# Patient Record
Sex: Male | Born: 1945 | Race: White | Hispanic: No | Marital: Married | Smoking: Never smoker
Health system: Southern US, Community
[De-identification: ages and names within clinical notes are randomized; demographics above are authoritative.]

## PROBLEM LIST (undated history)

## (undated) DIAGNOSIS — R002 Palpitations: Secondary | ICD-10-CM

## (undated) DIAGNOSIS — K5790 Diverticulosis of intestine, part unspecified, without perforation or abscess without bleeding: Secondary | ICD-10-CM

## (undated) DIAGNOSIS — I10 Essential (primary) hypertension: Secondary | ICD-10-CM

## (undated) DIAGNOSIS — I471 Supraventricular tachycardia, unspecified: Secondary | ICD-10-CM

## (undated) DIAGNOSIS — C61 Malignant neoplasm of prostate: Secondary | ICD-10-CM

## (undated) DIAGNOSIS — E785 Hyperlipidemia, unspecified: Secondary | ICD-10-CM

## (undated) DIAGNOSIS — Z0000 Encounter for general adult medical examination without abnormal findings: Secondary | ICD-10-CM

## (undated) HISTORY — DX: Essential (primary) hypertension: I10

## (undated) HISTORY — DX: Malignant neoplasm of prostate: C61

## (undated) HISTORY — DX: Palpitations: R00.2

## (undated) HISTORY — DX: Supraventricular tachycardia, unspecified: I47.10

## (undated) HISTORY — DX: Supraventricular tachycardia: I47.1

## (undated) HISTORY — DX: Hyperlipidemia, unspecified: E78.5

## (undated) HISTORY — DX: Encounter for general adult medical examination without abnormal findings: Z00.00

## (undated) HISTORY — PX: BACK SURGERY: SHX140

## (undated) HISTORY — DX: Diverticulosis of intestine, part unspecified, without perforation or abscess without bleeding: K57.90

---

## 2001-01-06 ENCOUNTER — Emergency Department (HOSPITAL_COMMUNITY): Admission: EM | Admit: 2001-01-06 | Discharge: 2001-01-07 | Payer: Self-pay | Admitting: Emergency Medicine

## 2001-01-07 ENCOUNTER — Encounter: Payer: Self-pay | Admitting: Emergency Medicine

## 2002-11-21 ENCOUNTER — Encounter: Payer: Self-pay | Admitting: Internal Medicine

## 2004-06-05 ENCOUNTER — Ambulatory Visit: Payer: Self-pay | Admitting: Cardiology

## 2004-06-18 ENCOUNTER — Ambulatory Visit: Payer: Self-pay

## 2004-06-27 ENCOUNTER — Ambulatory Visit: Payer: Self-pay | Admitting: Cardiology

## 2004-07-08 ENCOUNTER — Ambulatory Visit: Payer: Self-pay | Admitting: Cardiology

## 2004-08-14 ENCOUNTER — Ambulatory Visit: Payer: Self-pay | Admitting: Cardiology

## 2004-10-20 ENCOUNTER — Ambulatory Visit: Payer: Self-pay | Admitting: Cardiology

## 2005-02-19 ENCOUNTER — Ambulatory Visit: Payer: Self-pay | Admitting: Internal Medicine

## 2005-11-18 ENCOUNTER — Ambulatory Visit: Payer: Self-pay | Admitting: Cardiology

## 2005-11-26 ENCOUNTER — Ambulatory Visit: Payer: Self-pay | Admitting: Cardiology

## 2005-12-18 ENCOUNTER — Ambulatory Visit: Payer: Self-pay | Admitting: Cardiology

## 2006-01-20 ENCOUNTER — Ambulatory Visit: Payer: Self-pay | Admitting: Cardiology

## 2006-05-20 ENCOUNTER — Ambulatory Visit: Payer: Self-pay

## 2006-06-08 ENCOUNTER — Ambulatory Visit: Payer: Self-pay

## 2006-08-23 ENCOUNTER — Ambulatory Visit: Payer: Self-pay | Admitting: Cardiology

## 2007-02-17 ENCOUNTER — Ambulatory Visit: Payer: Self-pay | Admitting: Internal Medicine

## 2007-02-17 LAB — CONVERTED CEMR LAB
ALT: 22 units/L (ref 0–53)
AST: 22 units/L (ref 0–37)
Albumin: 3.6 g/dL (ref 3.5–5.2)
Alkaline Phosphatase: 62 units/L (ref 39–117)
BUN: 12 mg/dL (ref 6–23)
Basophils Absolute: 0 10*3/uL (ref 0.0–0.1)
Basophils Relative: 0.3 % (ref 0.0–1.0)
Bilirubin, Direct: 0.1 mg/dL (ref 0.0–0.3)
CO2: 31 meq/L (ref 19–32)
CRP, High Sensitivity: 0.6
Calcium: 9.1 mg/dL (ref 8.4–10.5)
Chloride: 105 meq/L (ref 96–112)
Cholesterol: 165 mg/dL (ref 0–200)
Creatinine, Ser: 1.1 mg/dL (ref 0.4–1.5)
Eosinophils Absolute: 0.2 10*3/uL (ref 0.0–0.6)
Eosinophils Relative: 3.7 % (ref 0.0–5.0)
GFR calc Af Amer: 88 mL/min
GFR calc non Af Amer: 73 mL/min
Glucose, Bld: 95 mg/dL (ref 70–99)
HCT: 39.5 % (ref 39.0–52.0)
HDL: 46 mg/dL (ref >39.0)
Hemoglobin: 14 g/dL (ref 13.0–17.0)
LDL Cholesterol: 97 mg/dL (ref 0–99)
Lymphocytes Relative: 39.3 % (ref 12.0–46.0)
MCHC: 35.3 g/dL (ref 30.0–36.0)
MCV: 93.8 fL (ref 78.0–100.0)
Monocytes Absolute: 0.5 10*3/uL (ref 0.2–0.7)
Monocytes Relative: 8.7 % (ref 3.0–11.0)
Neutro Abs: 2.5 10*3/uL (ref 1.4–7.7)
Neutrophils Relative %: 48 % (ref 43.0–77.0)
PSA: 1.07 ng/mL (ref 0.10–4.00)
Platelets: 322 10*3/uL (ref 150–400)
Potassium: 4.4 meq/L (ref 3.5–5.1)
RBC: 4.21 M/uL — ABNORMAL LOW (ref 4.22–5.81)
RDW: 12.6 % (ref 11.5–14.6)
Sodium: 142 meq/L (ref 135–145)
TSH: 0.63 microintl units/mL (ref 0.35–5.50)
Total Bilirubin: 0.7 mg/dL (ref 0.3–1.2)
Total CHOL/HDL Ratio: 3.6
Total Protein: 6.3 g/dL (ref 6.0–8.3)
Triglycerides: 108 mg/dL (ref 0–149)
VLDL: 22 mg/dL (ref 0–40)
WBC: 5.2 10*3/uL (ref 4.5–10.5)

## 2007-02-21 ENCOUNTER — Ambulatory Visit: Payer: Self-pay | Admitting: Internal Medicine

## 2007-02-22 ENCOUNTER — Ambulatory Visit: Payer: Self-pay | Admitting: Cardiology

## 2007-06-03 ENCOUNTER — Ambulatory Visit (HOSPITAL_COMMUNITY): Admission: RE | Admit: 2007-06-03 | Discharge: 2007-06-05 | Payer: Self-pay | Admitting: Orthopedic Surgery

## 2007-06-13 ENCOUNTER — Telehealth (INDEPENDENT_AMBULATORY_CARE_PROVIDER_SITE_OTHER): Payer: Self-pay | Admitting: *Deleted

## 2007-06-14 DIAGNOSIS — E785 Hyperlipidemia, unspecified: Secondary | ICD-10-CM | POA: Insufficient documentation

## 2007-06-14 DIAGNOSIS — K5730 Diverticulosis of large intestine without perforation or abscess without bleeding: Secondary | ICD-10-CM | POA: Insufficient documentation

## 2007-06-14 DIAGNOSIS — I10 Essential (primary) hypertension: Secondary | ICD-10-CM

## 2007-06-14 DIAGNOSIS — R002 Palpitations: Secondary | ICD-10-CM

## 2007-06-30 ENCOUNTER — Telehealth (INDEPENDENT_AMBULATORY_CARE_PROVIDER_SITE_OTHER): Payer: Self-pay | Admitting: *Deleted

## 2008-03-12 ENCOUNTER — Ambulatory Visit: Payer: Self-pay | Admitting: Internal Medicine

## 2008-03-12 DIAGNOSIS — R635 Abnormal weight gain: Secondary | ICD-10-CM | POA: Insufficient documentation

## 2008-03-12 DIAGNOSIS — N400 Enlarged prostate without lower urinary tract symptoms: Secondary | ICD-10-CM

## 2008-07-17 ENCOUNTER — Ambulatory Visit: Payer: Self-pay | Admitting: Internal Medicine

## 2008-07-17 DIAGNOSIS — J069 Acute upper respiratory infection, unspecified: Secondary | ICD-10-CM | POA: Insufficient documentation

## 2008-11-01 ENCOUNTER — Ambulatory Visit: Payer: Self-pay | Admitting: Cardiology

## 2008-11-01 ENCOUNTER — Ambulatory Visit: Payer: Self-pay | Admitting: Internal Medicine

## 2008-11-01 ENCOUNTER — Telehealth (INDEPENDENT_AMBULATORY_CARE_PROVIDER_SITE_OTHER): Payer: Self-pay | Admitting: *Deleted

## 2008-11-01 LAB — CONVERTED CEMR LAB
Calcium: 9.1 mg/dL (ref 8.4–10.5)
GFR calc non Af Amer: 80.57 mL/min (ref >60)
Sodium: 142 meq/L (ref 135–145)

## 2008-11-02 ENCOUNTER — Telehealth (INDEPENDENT_AMBULATORY_CARE_PROVIDER_SITE_OTHER): Payer: Self-pay | Admitting: *Deleted

## 2008-11-02 ENCOUNTER — Ambulatory Visit: Payer: Self-pay | Admitting: Internal Medicine

## 2008-11-02 DIAGNOSIS — T887XXA Unspecified adverse effect of drug or medicament, initial encounter: Secondary | ICD-10-CM | POA: Insufficient documentation

## 2008-11-14 ENCOUNTER — Encounter: Payer: Self-pay | Admitting: Cardiology

## 2008-11-14 ENCOUNTER — Ambulatory Visit: Payer: Self-pay | Admitting: Cardiology

## 2008-12-03 ENCOUNTER — Telehealth (INDEPENDENT_AMBULATORY_CARE_PROVIDER_SITE_OTHER): Payer: Self-pay | Admitting: *Deleted

## 2009-04-12 ENCOUNTER — Telehealth: Payer: Self-pay | Admitting: Cardiology

## 2009-04-15 ENCOUNTER — Telehealth: Payer: Self-pay | Admitting: Cardiology

## 2009-09-30 ENCOUNTER — Telehealth (INDEPENDENT_AMBULATORY_CARE_PROVIDER_SITE_OTHER): Payer: Self-pay | Admitting: *Deleted

## 2009-11-27 ENCOUNTER — Telehealth (INDEPENDENT_AMBULATORY_CARE_PROVIDER_SITE_OTHER): Payer: Self-pay | Admitting: *Deleted

## 2009-12-10 ENCOUNTER — Ambulatory Visit: Payer: Self-pay | Admitting: Internal Medicine

## 2010-01-23 ENCOUNTER — Ambulatory Visit: Payer: Self-pay | Admitting: Internal Medicine

## 2010-01-24 LAB — CONVERTED CEMR LAB
Albumin: 3.8 g/dL (ref 3.5–5.2)
Alkaline Phosphatase: 68 units/L (ref 39–117)
BUN: 17 mg/dL (ref 6–23)
Basophils Absolute: 0 10*3/uL (ref 0.0–0.1)
CO2: 31 meq/L (ref 19–32)
Calcium: 8.9 mg/dL (ref 8.4–10.5)
Creatinine, Ser: 1 mg/dL (ref 0.4–1.5)
Eosinophils Absolute: 0.2 10*3/uL (ref 0.0–0.7)
Glucose, Bld: 88 mg/dL (ref 70–99)
HDL: 51.5 mg/dL (ref >39.00)
Leukocytes, UA: NEGATIVE
Lymphocytes Relative: 28.8 % (ref 12.0–46.0)
MCHC: 34.2 g/dL (ref 30.0–36.0)
Neutro Abs: 3.4 10*3/uL (ref 1.4–7.7)
Neutrophils Relative %: 59.4 % (ref 43.0–77.0)
Nitrite: NEGATIVE
RDW: 13.5 % (ref 11.5–14.6)
Specific Gravity, Urine: 1.02 (ref 1.000–1.030)
Triglycerides: 118 mg/dL (ref 0.0–149.0)
Urine Glucose: NEGATIVE mg/dL
Urobilinogen, UA: 0.2 (ref 0.0–1.0)

## 2010-02-19 ENCOUNTER — Telehealth (INDEPENDENT_AMBULATORY_CARE_PROVIDER_SITE_OTHER): Payer: Self-pay | Admitting: *Deleted

## 2010-08-24 LAB — CONVERTED CEMR LAB
Albumin: 4.1 g/dL (ref 3.5–5.2)
Alkaline Phosphatase: 65 units/L (ref 39–117)
BUN: 16 mg/dL (ref 6–23)
Basophils Absolute: 0 10*3/uL (ref 0.0–0.1)
Bilirubin Urine: NEGATIVE
Cholesterol: 170 mg/dL (ref 0–200)
Creatinine, Ser: 1 mg/dL (ref 0.4–1.5)
Eosinophils Absolute: 0.2 10*3/uL (ref 0.0–0.7)
Eosinophils Relative: 3.2 % (ref 0.0–5.0)
GFR calc Af Amer: 98 mL/min
GFR calc non Af Amer: 81 mL/min
HCT: 41.9 % (ref 39.0–52.0)
HDL: 43.1 mg/dL (ref >39.0)
Hemoglobin, Urine: NEGATIVE
LDL Cholesterol: 107 mg/dL — ABNORMAL HIGH (ref 0–99)
MCHC: 34.5 g/dL (ref 30.0–36.0)
MCV: 96.9 fL (ref 78.0–100.0)
Monocytes Absolute: 0.5 10*3/uL (ref 0.1–1.0)
PSA: 1.05 ng/mL (ref 0.10–4.00)
Platelets: 294 10*3/uL (ref 150–400)
Potassium: 4.8 meq/L (ref 3.5–5.1)
TSH: 0.47 microintl units/mL (ref 0.35–5.50)
Total Bilirubin: 0.7 mg/dL (ref 0.3–1.2)
Total Protein, Urine: NEGATIVE mg/dL
Triglycerides: 98 mg/dL (ref 0–149)
Urine Glucose: NEGATIVE mg/dL
Urobilinogen, UA: 0.2 (ref 0.0–1.0)
VLDL: 20 mg/dL (ref 0–40)
WBC: 5.8 10*3/uL (ref 4.5–10.5)

## 2010-08-25 ENCOUNTER — Telehealth (INDEPENDENT_AMBULATORY_CARE_PROVIDER_SITE_OTHER): Payer: Self-pay | Admitting: *Deleted

## 2010-08-26 NOTE — Assessment & Plan Note (Signed)
Summary: Primary svc/ hbp   Primary Provider/Referring Provider:  [name]  CC:  Yearly followup.  Pt denies any complaints.  .  History of Present Illness: 61 yowm with  history of  palpitations and hypertension and nonsustained ventricular tachycardia  responsive to beta blockers.  July 17, 2008--Complains of 3 days of nasal congestion, cough, congestion, drainage. sore throat. Responded to zpark, mucinex dm and zyrtec.   November 02, 2008 ov cough came back 4 days prior to visit after doubled lisinopril assoc with intense sore throat withouth sign nasal symptoms, sob, excess mucus or purulent sputum. No itching / sneezing. Pt denies any  fever, chills, sweats, unintended wt loss, pleuritic or exertional cp, orthopnea pnd or leg swelling.  rec d/c ace > complete resolution  Dec 10, 2009 Yearly followup.  Pt denies any complaints.  Pt denies any significant sore throat, dysphagia, itching, sneezing,  nasal congestion or excess secretions,  fever, chills, sweats, unintended wt loss, pleuritic or exertional cp, hempoptysis, change in activity tolerance  orthopnea pnd or leg swelling   Current Medications (verified): 1)  Metoprolol Tartrate 25 Mg Tabs (Metoprolol Tartrate) .Marland Kitchen.. 1 Once Daily 2)  Bayer Low Strength 81 Mg  Tbec (Aspirin) .... Take 1 Tablet By Mouth Once A Day 3)  Viagra 50 Mg Tabs (Sildenafil Citrate) .... Use One Hour Before Activity As Needed 4)  Hyzaar 100-25 Mg Tabs (Losartan Potassium-Hctz) .... Take 1 Tablet By Mouth Once A Day  Allergies (verified): No Known Drug Allergies  Past History:  Past Medical History: HBP    - ACE d/c'd 11/02/08 cough/sorethroat > resolved Palpitations   - neg myoview 11/05 with EF 58% Divertiuculosis   - see colonoscopy 5/06 rec fu 5/16 Health Maintenance...........................................Marland KitchenWert  Vital Signs:  Patient profile:   66 year old male Weight:      185 pounds BMI:     25.18 O2 Sat:      98 % on Room air Temp:     97.6  degrees F oral Pulse rate:   76 / minute BP sitting:   130 / 90  (left arm)  Vitals Entered By: Vernie Murders (Dec 10, 2009 10:10 AM)  O2 Flow:  Room air  Physical Exam  Additional Exam:  wt 198  > 201 11/01/08 > 185 Dec 10, 2009  anxious wm in nad  HEENT: nl dentition, turbinates, and orophanx. throat completely unremarkable, nontender sinus.  Neck without JVD/Nodes/TM Lungs clear to A and P bilaterally without cough on insp or exp maneuvers RRR no s3 or murmur or increase in P2 Abd soft and benign with nl excursion in the supine position. No bruits or organomegaly fem pulses intact Ext warm without calf tenderness, cyanosis clubbing or edema, nl pulses     Impression & Recommendations:  Problem # 1:  HYPERTENSION (ICD-401.9)  not ideal. needs to use the metaprolol two times a day to help smooth out the peaks and troughs  His updated medication list for this problem includes:    Metoprolol Tartrate 25 Mg Tabs (Metoprolol tartrate) .Marland Kitchen... 1 twice daily    Hyzaar 100-25 Mg Tabs (Losartan potassium-hctz) .Marland Kitchen... Take 1 tablet by mouth once a day  Orders: Est. Patient Level III ([address]109)  Problem # 2:  HYPERLIPIDEMIA, MILD (ICD-272.4)  Labs Reviewed: SGOT: 24 (03/12/2008)   SGPT: 26 (03/12/2008)   HDL:43.1 (03/12/2008), 46.0 (02/17/2007)  LDL:107 (03/12/2008), 97 (60/45/4098)  Chol:170 (03/12/2008), 165 (02/17/2007)  Trig:98 (03/12/2008), 108 (02/17/2007)   needs cpx  Orders: Est.  Patient Level III (85027)  Medications Added to Medication List This Visit: 1)  Metoprolol Tartrate 25 Mg Tabs (Metoprolol tartrate) .Marland Kitchen.. 1 once daily 2)  Metoprolol Tartrate 25 Mg Tabs (Metoprolol tartrate) .Marland Kitchen.. 1 twice daily  Patient Instructions: 1)  Change metaprolol to 25 mg one twice daily 2)  Continue hyzaar unless blood pressure drops too low in which case you can cut in half or call for half case. 3)  Please schedule a follow-up appointment in 6 weeks with CPX Prescriptions: METOPROLOL  TARTRATE 25 MG TABS (METOPROLOL TARTRATE) 1 twice daily  #60 x 11   Entered and Authorized by:   Nyoka Cowden MD   Signed by:   Nyoka Cowden MD on 12/10/2009   Method used:   Electronically to        [facility]  [phone]* (retail)       [address]       [place], [place]  [address]       Ph: [phone] or [phone]       Fax: [phone]   RxID:   [id number]

## 2010-08-26 NOTE — Progress Notes (Signed)
Summary: bp meds needed asap- pt out  Phone Note Call from Patient Call back at Work Phone [phone]   Caller: Patient Call For: wert Summary of Call: pt requests to have rx LOSARTAN HCTZ called in to [facility] on [address]. he is out and needs this today asap. wants his full rx (will not do mail order this time per pt). pt # [id number] Initial call taken by: Tivis Ringer, CNA,  September 30, 2009 9:37 AM  Follow-up for Phone Call        Pt informed that refill was sent to pharmacy. Abigail Miyamoto RN  September 30, 2009 9:51 AM     Prescriptions: HYZAAR 100-25 MG TABS (LOSARTAN POTASSIUM-HCTZ) Take 1 tablet by mouth once a day  #30 x 2   Entered by:   Abigail Miyamoto RN   Authorized by:   Nyoka Cowden MD   Signed by:   Abigail Miyamoto RN on 09/30/2009   Method used:   Electronically to        [facility]  [phone]* (retail)       [address]       [place], [place]  [address]       Ph: [phone] or [phone]       Fax: [phone]   RxID:   [id number]

## 2010-08-26 NOTE — Progress Notes (Signed)
Summary: Refill Viagra   Phone Note Outgoing Call   Summary of Call: let him know I refilled his prescription as requested Initial call taken by: Nyoka Cowden MD,  Nov 27, 2009 3:50 PM  Follow-up for Phone Call        ATC pt, home # not working.  ATC work #, NA. Boone Master CNA  Nov 27, 2009 3:56 PM  pt called, advised of ready rx at pharmacy.  also informed pt that he is overdue for appt - appt made with MW 12-10-09 @ 1030.  pt aware he must keep this appt to continue to receive med refills.  pt verbalized his understanding.  Follow-up by: Boone Master CNA,  Nov 27, 2009 4:01 PM    New/Updated Medications: VIAGRA 50 MG TABS (SILDENAFIL CITRATE) Use one hour before activity as needed Prescriptions: VIAGRA 50 MG TABS (SILDENAFIL CITRATE) Use one hour before activity as needed  #10 x 11   Entered and Authorized by:   Nyoka Cowden MD   Signed by:   Nyoka Cowden MD on 11/27/2009   Method used:   Electronically to        [facility]  [phone]* (retail)       [address]       [place], [place]  [address]       Ph: [phone] or [phone]       Fax: [phone]   RxID:   [id number]

## 2010-08-26 NOTE — Progress Notes (Signed)
Summary: ok to take Tylenol #3 with bp meds  Phone Note Call from Patient Call back at Home Phone [phone]   Caller: Patient Call For: wert Reason for Call: Talk to Nurse Summary of Call: Pt states he has widsom tooth pulled, was prescribed tylenol 3 for pain, wants to know if it's ok to take with his bp meds, pls advise. Initial call taken by: Darletta Moll,  February 19, 2010 3:04 PM  Follow-up for Phone Call        Pls advise thanks Vernie Murders  February 19, 2010 3:10 PM it's fine Follow-up by: Nyoka Cowden MD,  February 19, 2010 3:25 PM  Additional Follow-up for Phone Call Additional follow up Details #1::        called spoke with patient.  advised of MW's response as stated above.  pt verbalized his understanding. Boone Master CNA/MA  February 19, 2010 3:28 PM

## 2010-08-26 NOTE — Assessment & Plan Note (Signed)
Summary: Primary svc/ cpx    Primary Provider/Referring Provider:  [name]  CC:  CPX-Pt is not fasting.Marland Kitchen  History of Present Illness: 65 yowm  never smoker with  history of  palpitations and hypertension and nonsustained ventricular tachycardia  responsive to beta blockers.  November 02, 2008 ov cough  after doubled lisinopril assoc with intense sore throat withouth sign nasal symptoms, sob, excess mucus or purulent sputum. No itching / sneezing.  rec d/c ace, resolved  January 23, 2010  cpx no complaints, works out 3 x weekly.  Pt denies any significant sore throat, dysphagia, itching, sneezing,  nasal congestion or excess secretions,  fever, chills, sweats, unintended wt loss, pleuritic or exertional cp, hempoptysis, change in activity tolerance  orthopnea pnd or leg swelling.       Current Medications (verified): 1)  Metoprolol Tartrate 25 Mg Tabs (Metoprolol Tartrate) .Marland Kitchen.. 1 Twice Daily 2)  Bayer Low Strength 81 Mg  Tbec (Aspirin) .... Take 1 Tablet By Mouth Once A Day 3)  Viagra 50 Mg Tabs (Sildenafil Citrate) .... Use One Hour Before Activity As Needed 4)  Hyzaar 100-25 Mg Tabs (Losartan Potassium-Hctz) .... 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 throat completely unremarkable, nontender sinus.  Neck without JVD/Nodes/TM Lungs clear to A and P bilaterally without cough on insp or exp maneuvers RRR no s3 or murmur or increase in P2 Abd soft and benign with nl excursion in the supine position. 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, stool g neg Neuro alert, approp, no deficits  Cholesterol               175 mg/dL                   0-454     ATP III Classification            Desirable:  < 200 mg/dL                    Borderline High:  200 - 239 mg/dL               High:  > =  240 mg/dL   Triglycerides             118.0 mg/dL                 0.4-540.9     Normal:  <150 mg/dL     Borderline High:  811 - 199 mg/dL   HDL                       91.47 mg/dL                 >82.95   VLDL Cholesterol          23.6 mg/dL                  6.2-13.0   LDL Cholesterol      [H]  865 mg/dL                   7-84  CHO/HDL Ratio:  CHD Risk                             3                    Men          Women     1/2 Average Risk     3.4          3.3     Average Risk          5.0          4.4     2X Average Risk          9.6          7.1     3X Average Risk          15.0          11.0                           Tests: (2) BMP (METABOL)   Sodium                    141 mEq/L                   135-145   Potassium                 4.5 mEq/L                   3.5-5.1   Chloride                  107 mEq/L                   96-112   Carbon Dioxide            31 mEq/L                    19-32   Glucose                   88 mg/dL                    69-62   BUN                       17 mg/dL  6-23   Creatinine                1.0 mg/dL                   7.8-4.6   Calcium                   8.9 mg/dL                   9.6-29.5   GFR                       84.12 mL/min                >60  Tests: (3) CBC Platelet w/Diff (CBCD)   White Cell Count          5.8 K/uL                    4.5-10.5   Red Cell Count            4.33 Mil/uL                 4.22-5.81   Hemoglobin                14.2 g/dL                   28.4-13.2   Hematocrit                41.5 %                      39.0-52.0   MCV                       95.9 fl                     78.0-100.0   MCHC                      34.2 g/dL                   44.0-10.2   RDW                       13.5 %                      11.5-14.6   Platelet Count            274.0 K/uL                  150.0-400.0   Neutrophil %              59.4 %                      43.0-77.0   Lymphocyte %              28.8 %                      12.0-46.0   Monocyte %                8.5 %                       3.0-12.0   Eosinophils%  3.1 %                       0.0-5.0   Basophils %               0.2 %                       0.0-3.0   Neutrophill Absolute      3.4 K/uL                    1.4-7.7   Lymphocyte Absolute       1.7 K/uL                    0.7-4.0   Monocyte Absolute         0.5 K/uL                    0.1-1.0  Eosinophils, Absolute                             0.2 K/uL                    0.0-0.7   Basophils Absolute        0.0 K/uL                    0.0-0.1  Tests: (4) Hepatic/Liver Function Panel (HEPATIC)   Total Bilirubin           0.7 mg/dL                   6.2-3.7   Direct Bilirubin          0.1 mg/dL                   6.2-8.3   Alkaline Phosphatase      68 U/L                      39-117   AST                       19 U/L                      0-37   ALT                       18 U/L                      0-53   Total Protein             6.5 g/dL                    1.5-1.7   Albumin                   3.8 g/dL                     6.1-6.0  Tests: (5) TSH (TSH)   FastTSH                   0.82 uIU/mL                 0.35-5.50  Tests: (6) UDip Only (UDIP)   Color  LT. YELLOW       RANGE:  Yellow;Lt. Yellow   Clarity                   CLEAR                       Clear   Specific Gravity          1.020                       1.000 - 1.030   Urine Ph                  6.0                         5.0-8.0   Protein                   NEGATIVE                    Negative   Urine Glucose             NEGATIVE                    Negative   Ketones                   NEGATIVE                    Negative   Urine Bilirubin           NEGATIVE                    Negative   Blood                     NEGATIVE                    Negative   Urobilinogen              0.2                         0.0 - 1.0   Leukocyte Esterace        NEGATIVE                    Negative   Nitrite                   NEGATIVE                    Negative   CXR  Procedure date:  01/23/2010  Findings:      Comparison: 03/12/2008   Findings: Trachea is midline.  Heart size normal.  Lungs are clear. No pleural fluid. Pectus deformity.   IMPRESSION: No acute findings.  Impression & Recommendations:  Problem # 1:  HYPERTENSION (ICD-401.9)  His updated medication list for this problem includes:    Metoprolol Tartrate 25 Mg Tabs (Metoprolol tartrate) .Marland Kitchen... 1 twice daily    Hyzaar 100-25 Mg Tabs (Losartan potassium-hctz) .Marland Kitchen... Take 1 tablet by mouth once a day  Orders: EKG w/ Interpretation (93000) T-2 View CXR (71020TC)  Problem # 2:  PALPITATIONS (ICD-785.1)  His updated medication list for this problem includes:    Metoprolol Tartrate 25 Mg Tabs (Metoprolol tartrate) .Marland Kitchen... 1 twice daily  Problem # 3:  HYPERLIPIDEMIA, MILD (ICD-272.4)  Labs Reviewed: SGOT: 24 (03/12/2008)   SGPT: 26 (03/12/2008)   HDL:43.1 (03/12/2008), 46.0 (02/17/2007)  LDL:107 (03/12/2008), 97 (78/29/5621) =    January 23, 2010 =100   Chol:170  (03/12/2008), 165 (02/17/2007)  Trig:98 (03/12/2008), 108 (02/17/2007)  Other Orders: TLB-Lipid Panel (80061-LIPID) TLB-BMP (Basic Metabolic Panel-BMET) (80048-METABOL) TLB-CBC Platelet - w/Differential (85025-CBCD) TLB-Hepatic/Liver Function Pnl (80076-HEPATIC) TLB-TSH (Thyroid Stimulating Hormone) (84443-TSH) TLB-Udip ONLY (81003-UDIP) Tdap => 11yrs IM (30865) Admin 1st Vaccine (78469) Est. Patient 40-64 years (62952)  Patient Instructions: 1)  See Patient Care Coordinator before leaving for to sign release for path report on skin bx 2)  Call [phone] for your results w/in next 3 days - if there's something important  I feel you need to know,  I'll be in touch with you directly.  3)  Please schedule a follow-up appointment in 6 months, sooner if needed    [facility] ECG  ID: [id number] Patient: Cory Vasquez, Cory Vasquez DOB: Aug 27, 1945 Age: 65 Years Old Sex: Male Race: White Height: 72 Weight: 186 Status: Unconfirmed Past Medical History:  HBP    - ACE d/c'd 11/02/08 cough/sorethroat > resolved Palpitations   - neg myoview 11/05 with EF 58% Divertiuculosis   - see colonoscopy 5/06 rec fu 5/16 Health Maintenance...........................................Marland KitchenWert Recorded: 01/23/2010 09:06 AM P/PR: 112 ms / 170 ms - Heart rate (maximum exercise) QRS: 83 QT/QTc/QTd: 408 ms / 403 ms / 163 ms - Heart rate (maximum exercise)  P/QRS/T axis: 67 deg / 50 deg / 59 deg - Heart rate (maximum exercise)  Heartrate: 57 bpm  Interpretation:   sinus rhythm (slow)   Normal ECG    Orders Added: 1)  EKG w/ Interpretation [93000] 2)  TLB-Lipid Panel [80061-LIPID] 3)  TLB-BMP (Basic Metabolic Panel-BMET) [80048-METABOL] 4)  TLB-CBC Platelet - w/Differential [85025-CBCD] 5)  TLB-Hepatic/Liver Function Pnl [80076-HEPATIC] 6)  TLB-TSH (Thyroid Stimulating Hormone) [84443-TSH] 7)  TLB-Udip ONLY [81003-UDIP] 8)  Tdap => 8yrs IM [90715] 9)  Admin 1st Vaccine [90471] 10)  T-2 View CXR  [71020TC] 11)  Est. Patient 40-64 years [64403]    Immunizations Administered:  Tetanus Vaccine:    Vaccine Type: Tdap    Site: right deltoid    Mfr: GlaxoSmithKline    Dose: 0.5 ml    Route: IM    Given by: Carver Fila    Exp. Date: 10/19/2011    Lot #: [id number]

## 2010-09-03 NOTE — Progress Notes (Signed)
Summary: rx-AWAIT A CALL BACK FROM PT  Phone Note Call from Patient Call back at Home Phone [phone]   Caller: Patient Call For: wert Summary of Call: requests rx for viagra- [facility] (1 month supply only per pt).  Initial call taken by: Tivis Ringer, CNA,  August 25, 2010 1:11 PM  Follow-up for Phone Call        Spoke with pt and he states that he is going to check with [facility] again before we send in rx.  He states that he wants to be sure this should not be sent to retail pharm.  I advised that typically we only send 90 day supply of meds to [facility].  Will await a call back. Vernie Murders  August 25, 2010 3:10 PM   Additional Follow-up for Phone Call Additional follow up Details #1::        Spoke with pt.  He states that refill is no longer needed.   Additional Follow-up by: Vernie Murders,  August 26, 2010 5:10 PM

## 2010-09-29 ENCOUNTER — Encounter: Payer: Self-pay | Admitting: Adult Health

## 2010-09-29 ENCOUNTER — Ambulatory Visit (INDEPENDENT_AMBULATORY_CARE_PROVIDER_SITE_OTHER): Payer: Self-pay | Admitting: Adult Health

## 2010-09-29 DIAGNOSIS — J069 Acute upper respiratory infection, unspecified: Secondary | ICD-10-CM

## 2010-10-07 NOTE — Assessment & Plan Note (Signed)
Summary: Acute NP office visit - sinus inf   Primary Provider/Referring Provider:  [name]  CC:  prod cough clear mucus, sore throat, sinus pressure/congestion, PND w/ clear nasal drainage, chills x2days - denies wheezing, and increased SOB.  History of Present Illness: 65 yowm  never smoker with  history of  palpitations and hypertension and nonsustained ventricular tachycardia  responsive to beta blockers.  November 02, 2008 ov cough  after doubled lisinopril assoc with intense sore throat withouth sign nasal symptoms, sob, excess mucus or purulent sputum. No itching / sneezing.  rec d/c ace, resolved  January 23, 2010  cpx no complaints, works out 3 x weekly.  Pt denies any significant sore throat, dysphagia, itching, sneezing,  nasal congestion or excess secretions,  fever, chills, sweats, unintended wt loss, pleuritic or exertional cp, hempoptysis, change in activity tolerance  orthopnea pnd or leg swelling.     September 29, 2010 --Presents for an acute office visit. Complains of prod cough clear mucus, sore throat, sinus pressure/congestion, PND w/ clear nasal drainage, chills x4 days. Used some otc cold meds without much help. Cough and drainage worse today. Denies chest pain, dyspnea, orthopnea, hemoptysis, fever, n/v/d, edema, headache,recent travel or antibiotics.   Medications Prior to Update: 1)  Metoprolol Tartrate 25 Mg Tabs (Metoprolol Tartrate) .Marland Kitchen.. 1 Twice Daily 2)  Bayer Low Strength 81 Mg  Tbec (Aspirin) .... Take 1 Tablet By Mouth Once A Day 3)  Hyzaar 100-25 Mg Tabs (Losartan Potassium-Hctz) .... Take 1 Tablet By Mouth Once A Day 4)  Viagra 50 Mg Tabs (Sildenafil Citrate) .... Use One Hour Before Activity As Needed  Current Medications (verified): 1)  Metoprolol Tartrate 25 Mg Tabs (Metoprolol Tartrate) .... Take 1 Tablet By Mouth Every Morning 2)  Bayer Low Strength 81 Mg  Tbec (Aspirin) .... Take 1 Tablet By Mouth Once A Day 3)  Hyzaar 100-25 Mg Tabs (Losartan Potassium-Hctz) ....  Take 1 Tablet By Mouth Once A Day 4)  Viagra 50 Mg Tabs (Sildenafil Citrate) .... Use One Hour Before Activity As Needed  Allergies (verified): No Known Drug Allergies  Past History:  Past Medical History: Last updated: 01/23/2010 HBP    - ACE d/c'd 11/02/08 cough/sorethroat > resolved Palpitations   - neg myoview 11/05 with EF 58% Divertiuculosis   - see colonoscopy 5/06 rec fu 11/2014 Health Maintenance...........................................Marland KitchenWert     - Td January 23, 2010      - CPX January 23, 2010   Past Surgical History: Last updated: 03/12/2008 Back surgery [name] 11/08   Family History: Last updated: 01/23/2010 Ht dz father in 70s, mother 38 DM and HBP brother no cancer  Social History: Last updated: 09/29/2010 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  Risk Factors: Smoking Status: never (06/14/2007)  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 No Comments Medications reviewed with patient Daytime contact number verified with patient. Boone Master CNA/MA  September 29, 2010 3:10 PM    Physical Exam  Additional Exam:  wt 198  > 201 11/01/08 > 185 Dec 10, 2009> 186  January 23, 2010 > 185 January 23, 2010 >191 September 29, 2010   anxious wm in nad  HEENT: nl dentition, turbinates, and orophanx. throat completely unremarkable, nasal mucosa irritated, red  swollen.   Neck without JVD/Nodes/TM Lungs clear to A and P bilaterally without cough on insp or exp maneuvers RRR no s3 or murmur or increase in P2 Abd soft and benign with nl excursion in the supine position. No bruits or organomegaly fem pulses intact Ext warm without calf tenderness, cyanosis clubbing or edema, nl pulses  Neuro alert, approp, no deficits   Impression & Recommendations:  Problem # 1:  UPPER RESPIRATORY INFECTION (ICD-465.9)  Acute URI ? viral in nature  Plan : Mucinex DM two times a day as needed cough/congestion  Saline nasal rinses as needed  Zyrtec 10mg  at bedtime as needed drainge Fluids and rest Tylenol as needed  Please contact office for sooner follow up if symptoms do not improve or worsen  Zpack to have on hold if symptoms do not imrprove with discolored mucus.  His updated medication list for this problem includes:    Bayer Low Strength 81 Mg Tbec (Aspirin) .Marland Kitchen... Take 1 tablet by mouth once a day  Orders: Est. Patient Level III (04540)  Medications Added to Medication List This Visit: 1)  Metoprolol Tartrate 25 Mg Tabs (Metoprolol tartrate) .... Take 1 tablet by mouth every morning 2)  Zithromax Z-pak 250 Mg Tabs (Azithromycin) .... Take  as directed  Patient Instructions: 1)  Mucinex DM two times a day as needed cough/congestion  2)  Saline nasal rinses as needed  3)  Zyrtec 10mg  at bedtime as needed drainge 4)  Fluids and rest 5)  Tylenol as needed  6)  Please contact office for sooner follow up if symptoms do not improve or worsen  7)  Zpack to have on hold if symptoms do not imrprove with discolored mucus.  Prescriptions: ZITHROMAX Z-PAK 250 MG TABS (AZITHROMYCIN) take  as directed  #1 x 0   Entered and Authorized by:   Rubye Oaks NP   Signed by:   Tammy Parrett NP on 09/29/2010   Method used:   Print then Give to Patient   RxID:   [id number]

## 2010-12-09 NOTE — Op Note (Signed)
NAMENORVIN, OHLIN              ACCOUNT NO.:  [id number]   MEDICAL RECORD NO.:  [id number]          PATIENT TYPE:  AMB   LOCATION:  DAY                          FACILITY:  [facility]   PHYSICIAN:  Georges Lynch. [name], M.D.DATE OF BIRTH:  1946-03-31   DATE OF PROCEDURE:  06/03/2007  DATE OF DISCHARGE:                               OPERATIVE REPORT   SURGEON:  Georges Lynch. Darrelyn Hillock, M.D.   ASSISTANT:  Marlowe Kays, M.D.   PREOPERATIVE DIAGNOSIS:  1. 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  2. Large foraminal disc at L4-L5 on the left.  Note, he had weakness      of his hip flexors on the left preop.  He has been in chronic pain      for several years.   POSTOPERATIVE DIAGNOSIS:  1. 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  2. Large foraminal disc at L4-L5 on the left.  Note, he had weakness      of his hip flexors on the left preop.  He has been in chronic pain      for several years.   OPERATION:  1. Decompression of the lateral recess and foraminotomy L4-L5, left.  2. Microdiscectomy of a foraminal disc at L4-L5 on the left.   PROCEDURE:  Under general anesthesia, routine orthopedic prepping and  draping of the lower back carried out.  Two needles were placed on the  back for localization purposes.  At this time, an incision was made over  the L4-L5 interspace.  Bleeders were identified and cauterized.  A  second x-ray was taken to verify our position once we were down to the  lamina.  We separated the muscle from the lamina bilaterally.  Self-  retaining retractors were inserted.  We then went down and carried out a  hemilaminectomy in the usual fashion.  We went out far laterally to  decompress the recess and do a partial facetectomy because of the nature  of the foraminal disc.  We needed to go out far laterally in order to be  able to reach the disc.  Following that, we gently retracted the dura.  We identified the nerve roots in the area and  cauterized lateral recess  veins with the bipolar.  A cruciate incision was made in the posterior  longitudinal ligament.  I then went down and carried out a discectomy.  I utilized the Epstein curettes and a hockey stick to go out far  laterally and decompressed the disc down into the space.  Once we  completed the discectomy, we then were easily able to take a nerve hook  up under the subligamentous space to tease out a few more pieces of  disc.  We kept persisting in regards to searching for the disc  bilaterally, proximally and distally, and medially, as well.  We felt we  had a good decompression.  We did foraminotomies at both roots.  We were  able to easily pass a hockey stick out the foramina after the  microdiscectomy was carried out.  We also were able to follow the  L5  root down, as well.  We had a very adequate, very thorough  decompression, thoroughly irrigated out the area, loosely applied some  thrombin soaked Gelfoam.  We then closed the wound in layers in the  usual fashion except I  left a small portion of the deep portion of the wound open proximally  for drainage purposes.  The subcu was closed with 0 Vicryl, the skin was  closed with metal staples.  A sterile Neosporin dressing was applied.  The patient left the operating room in satisfactory condition.           ______________________________  Georges Lynch Darrelyn Hillock, M.D.     RAG/MEDQ  D:  06/03/2007  T:  06/04/2007  Job:  [id number]

## 2010-12-09 NOTE — Assessment & Plan Note (Signed)
[facility]                            CARDIOLOGY OFFICE NOTE   CARSON, BOGDEN                     MRN:          [id number]  DATE:02/22/2007                            DOB:          Oct 27, 1945    REASON FOR VISIT:  Mr. Blizard is a very pleasant gentleman who I have  seen in the past for palpitations and hypertension.  Since I last saw  him he is doing extremely well.  He is exercising routinely and lost a  significant amount of weight.  He is watching his diet very closely as  well.  He denies any chest pain, dyspnea and there has been no  palpitations or syncope since we saw him before.   CURRENT MEDICATIONS:  His medications at present include:  1. Metoprolol ER 50 mg p.o. daily.  2. Hydrochlorothiazide 12.5 mg p.o. daily.  3. Aspirin 81 mg p.o. daily.  4. Lisinopril 20 mg p.o. daily.  5. Vitamin.   PHYSICAL EXAMINATION:  VITAL SIGNS:  Exam today shows a blood pressure  of 107/71 and his pulse is 58.  He weighs 182 pounds.  NECK:  Supple with no bruits.  CHEST:  Clear.  CARDIOVASCULAR:  Exam reveals a regular rate and rhythm.  ABDOMEN:  Exam is benign.  EXTREMITIES:  Show no edema.   CLINICAL DATA:  His electrocardiogram shows a sinus bradycardia with  rate of 54.  There are no ST changes noted.   DIAGNOSES:  1. Hypertension.  His blood pressure is well controlled on his present      medications.  2. History of palpitations and nonsustained ventricular tachycardia on      exercise treadmill with normal left ventricular function - he has      had no further palpitations - we will continue with his present      dose of Toprol.   PLAN:  Mr. Diguglielmo will continue with risk factor modification.  He will  see Dr. [name] for  his primary care issues.  I will see him back on an as-  needed basis.     Madolyn Frieze Jens Som, MD, [facility]  Electronically Signed    BSC/MedQ  DD: 02/22/2007  DT: 02/23/2007  Job #: [id number]

## 2010-12-09 NOTE — Assessment & Plan Note (Signed)
[facility]                             PULMONARY OFFICE NOTE   Cory Vasquez, Cory Vasquez                     MRN:          [id number]  DATE:02/21/2007                            DOB:          09-18-1945    HISTORY:  This is a 65 year old white male with palpitations and  hypertension who had nonsustained ventricular tachycardia at the time of  his cardiology evaluation, has subsequently been followed by Dr.  Jens Som with excellent control now of all of his symptoms since beta-  blocker was started.  On his most recent visits he has been having  trouble with orthostasis but now has it about right in terms of taking  his blood pressure medicines and having no symptoms of either  palpitation with exercise (which includes aerobics up to 5 times weekly)  or with daily activity nor any orthostatic complaints.  His last  Cardiolite was in 2005, was felt to be normal with a normal ejection  fraction.   The patient denies any exertional chest pain, orthopnea, PND or leg  swelling, TIA or claudication symptoms.   PAST MEDICAL HISTORY:  1. Hypertension.  2. Palpitations with nonsustained ventricular tachycardia as noted.  3. Difficulty with weight control and mild hyperlipidemia, with target      LDL less than 30 based on hypertension and male gender.  4. Diverticulosis by colonoscopy May of 2006.   ALLERGIES:  None known.   MEDICATIONS:  See Medication List column dated February 21, 2007.  Reviewed  with patient, correct as listed.   SOCIAL HISTORY:  1. He has never smoked.  2. Works in [employer]   FAMILY HISTORY:  1. Positive for heart disease in his father in mid 25's.  Died of CHF.  2. Older brother has diabetes and hypertension.  3. No family history of any colon or prostate cancer to his knowledge.   REVIEW OF SYSTEMS:  Taken in detail on the worksheet, negative except as  outlined above.   PHYSICAL EXAMINATION:  This is a robust, pleasant,  healthy, ambulatory  white male in no acute distress weighing 184, which is 2 pounds less  than target and down about 10 pounds over the last year intentionally.  Blood pressure, however, is 140/80 without taking his morning medicines.  HEENT:  Limited funduscopy reveals no significant retinal arterial  change.  Oropharynx is clear.  Dentition is intact.  He does have a wax  impaction in the left ear, otherwise normal.  The right ear exam was  normal.  Carotid upstrokes were brisk without any bruits.  There was no  tracheal deviation or thyromegaly.  LUNG FIELDS:  There were no carotid bruits with adequate upstrokes  bilaterally.  Lung fields perfectly clear bilaterally to auscultation  and percussion.  There was very mild pectus excavatum deformity.  HEART:  Regular rate and rhythm, without murmur, gallop or rub or  displacement of PMI.  ABDOMEN:  Soft, benign with no palpable organomegaly, masses or  tenderness.  The femoral pulses are present bilaterally, no bruits.  GENITOURINARY:  Testes descended bilaterally, no nodules.  RECTUM:  Revealed mild BPH, stool guaiac was negative, there were no  prostate nodules.  EXTREMITIES:  Warm without any calf tenderness, cyanosis, clubbing or  edema.  Both dorsalis pedis pulses were reduced slightly in a symmetric  fashion.  Posterior tibials were strong.  SKIN EXAM:  Warm and dry with no lesions.  NEUROLOGIC:  No focal deficits or pathologic reflexes.  MUSCULOSKELETAL EXAM:  Also unremarkable including hips, knees and  shoulders.   Chest x-ray was normal.   LAB DATA:  Included an LDL cholesterol down now to 97 with an HDL of 46.  A chemistry profile was unremarkable.  A CBC was unremarkable.  A TSH  was normal.   IMPRESSION:  1. Hypertension is adequately controlled despite the fact that he      neglected to take his blood pressure medicines today.  I have      advised him to do so, especially before he sees Dr. Jens Som to see      if  any further adjustments up or down need to be done.  2. His palpitations have resolved with the use of metoprolol.  No      evidence of underlying significant cardiac systolic or diastolic      function abnormalities nor any coronary disease to date clinically      or by Adenosine Myoview study in 2005; I will defer to Dr. Jens Som      whether additional screening studies need to be done.  3. Left ear wax retention noted.  I have offered the service a nurse      practitioner to remove this and reviewed with him the risks of      chronic wax impaction.  4. History of mild weight excess complicated by hyperlipidemia, now      both completely reversed with weight down at target less than 186.  5. Diverticulosis by most recent colonoscopy in May, 2006, in the      computer for recall.   FOLLOWUP:  The patient monitors his own blood pressure and is no longer  having symptoms.  I will defer to Dr. Jens Som whether cardiac followup  is even needed in this particular setting and will plan on seeing the  patient back here at least yearly for comprehensive care evaluation,  sooner if needed.     Cory Vasquez. Cory Sires, MD, [facility]  Electronically Signed   MBW/MedQ  DD: 02/21/2007  DT: 02/21/2007  Job #: [id number]   cc:   Madolyn Frieze. Jens Som, MD, [facility]

## 2010-12-09 NOTE — Assessment & Plan Note (Signed)
[facility]                            CARDIOLOGY OFFICE NOTE   Cory Vasquez, Cory Vasquez                       MRN:          [id number]  DATE:05/30/2007                            DOB:          June 13, 1946    This patient is a gentleman I have seen in the past for palpitations.  He also has a history of brief nonsustained ventricular tachycardia on  an exercise treadmill.  Of note, he had a Myoview on June 18, 2004.  There was no ischemia or infarction, and his ejection fraction was 58%.  An echocardiogram in November 2005 showed normal left ventricular  function and trace mitral and tricuspid regurgitation.  His palpitations  had improved on the Toprol.  He recently had an episode for several  minutes.  There was no associated pre-syncope, syncope, shortness of  breath, or chest pain.  It resolved spontaneously.  He is scheduled for  disk surgery in his lower back later this week and asks about proceeding  with surgery based on his palpitations.  Given that he has had no chest  pain or shortness of breath and a Myoview 3 years ago that was normal, I  have told him that he can proceed with his surgery.  Note, his  palpitations are not new and they have occurred previously in the past.  If they worsen in the future, we could consider an event monitor.     Madolyn Frieze Cory Som, MD, [facility]  Electronically Signed    BSC/MedQ  DD: 05/30/2007  DT: 05/31/2007  Job #: [id number]

## 2010-12-12 NOTE — Assessment & Plan Note (Signed)
 [facility]                              CARDIOLOGY OFFICE NOTE   JAMAURY, GUMZ                     MRN:          [id number]  DATE:06/08/2006                            DOB:          [birth year]    Mr. Limas is a gentleman I have seen in the past for hypertension as well  as palpitations.  Since I last saw him he called the office and his blood  pressure remained elevated.  He is now up to 40 of lisinopril and we added  Norvasc 5 mg p.o. daily.  He has only been on the Norvasc for approximately  5 days and his blood pressure is improving.  It is in the 130-135 range/80-  85 range.  He denies any chest pain, shortness of breath or headaches.   MEDICATIONS:  1. Toprol 50 mg p.o. daily.  2. Lisinopril 40 mg p.o. daily.  3. Norvasc 5 mg p.o. daily.  4. Hydrochlorothiazide 12.5 mg p.o. daily.  5. Aspirin 81 mg p.o. daily.   PHYSICAL EXAM TODAY:  Shows a blood pressure of 138/86 and his pulse is 61.  He weighs 209 pounds.  NECK:  Supple with no bruits.  CHEST:  Clear.  CARDIOVASCULAR:  Regular rate and rhythm.  EXTREMITIES:  Show no edema.   His electrocardiogram today shows a sinus rhythm at a rate of 61.  There are  no ST changes noted.   DIAGNOSES:  1. Hypertension.  2. History of palpitations and nonsustained ventricular tachycardia on      exercise treadmill.  3. Normal left ventricular function.   PLAN:  Mr. Eastham blood pressure is better on the Norvasc.  I have asked  him to continue to monitor it at home and if it increases then we could  increase his Norvasc to 10 mg p.o. daily.  He will otherwise continue on his  present medications.  I think the likelihood of secondary hypertension is  low, but we will check renal Dopplers to exclude renal artery stenosis.  I  will also check a TSH.  We also reviewed lifestyle modification including  exercise, diet, avoiding salt and alcohol.  He will see [place] back in 8-12   weeks.     Madolyn Frieze Jens Som, MD, [facility]  Electronically Signed    BSC/MedQ  DD: 06/08/2006  DT: 06/08/2006  Job #: [id number]

## 2010-12-12 NOTE — Assessment & Plan Note (Signed)
[facility]                            CARDIOLOGY OFFICE NOTE   Cory Vasquez, Cory Vasquez                     MRN:          [id number]  DATE:08/23/2006                            DOB:          11/08/1945    HISTORY OF PRESENT ILLNESS:  Cory Vasquez is a gentleman who has a  history of palpitations and hypertension.  When I last saw Cory Vasquez in  November, Cory Vasquez blood pressure had increased, and Cory Vasquez had recently had  Norvasc added to Cory Vasquez medical regimen.  At that time, we implemented  significant risk factor modification.  We also scheduled Cory Vasquez to have  renal Dopplers which Cory Vasquez did not show for.  TSH was normal.  Cory Vasquez has  implemented an exercise program and has lost 10 pounds since then with  strict diet measures.  Cory Vasquez now states that Cory Vasquez blood pressure is running  low and Cory Vasquez has had decrease Cory Vasquez Lisinopril from 40-20.  Cory Vasquez systolic  pressure still runs around 110 or less at times.  Cory Vasquez also states that at  times Cory Vasquez feels dizzy with standing.  There is no dyspnea or chest pain.   MEDICATIONS:  1. Metoprolol 50 mg p.o. daily.  2. Norvasc 5 mg p.o. daily.  3. Hydrochlorothiazide 12.5 mg p.o. daily.  4. Aspirin 81 mg p.o. daily.  5. Lisinopril 20 mg p.o. daily.   PHYSICAL EXAMINATION:  VITAL SIGNS:  Blood pressure 110/76, pulse 62.  NECK:  Supple with no bruits.  CHEST:  Clear.  CARDIOVASCULAR:  Regular rate and rhythm.  EXTREMITIES:  No edema.   DIAGNOSES:  1. Hypertension.  2. History of palpitations and nonsustained ventricular tachycardia on      exercise treadmill with preserved LV function.   PLAN:  Cory Vasquez is much better after implementing Cory Vasquez exercise  program and implementing dietary changes.  Cory Vasquez states that Cory Vasquez feels  lightheaded at times with standing and Cory Vasquez systolic blood pressure is at  110.  I have asked Cory Vasquez to discontinue Cory Vasquez Norvasc and to continue to  track Cory Vasquez blood pressure at home.  We will adjust as indicated.  I will  see Cory Vasquez back in  six months.     Madolyn Frieze Cory Som, MD, [facility]  Electronically Signed    BSC/MedQ  DD: 08/23/2006  DT: 08/23/2006  Job #: [id number]

## 2011-01-14 ENCOUNTER — Telehealth: Payer: Self-pay | Admitting: Cardiology

## 2011-01-14 NOTE — Telephone Encounter (Signed)
Agree with metoprolol twice daily and f/u as scheduled Olga Millers

## 2011-01-14 NOTE — Telephone Encounter (Signed)
Left message for pt of dr Ludwig Clarks recommendations Deliah Goody

## 2011-01-14 NOTE — Telephone Encounter (Signed)
Spoke with pt, his meds include losartan/HCTZ 100-25mg  once daily and metoprolol tat 25mg  once daily. Explained to pt that the metoprolol needs to be taken twice daily. He will restart the metoprolol at 25mg  twice daily. The pt has a follow up appt on 02-10-11. He will restart taking the metoprolol twice daily. Will forward to dr Jens Som for his review Deliah Goody

## 2011-01-14 NOTE — Telephone Encounter (Signed)
Per pt calling B/p reading. No chest pain , sob. Pt concern about B/p. Pt asking should appt be move up  6/19 @ noon 168/100 6/19 @ pm  150/95 6/20 @ am 149/105

## 2011-02-04 ENCOUNTER — Other Ambulatory Visit: Payer: Self-pay | Admitting: Internal Medicine

## 2011-02-05 ENCOUNTER — Encounter: Payer: Self-pay | Admitting: Cardiology

## 2011-02-10 ENCOUNTER — Encounter: Payer: BC Managed Care – PPO | Admitting: Cardiology

## 2011-02-25 ENCOUNTER — Encounter: Payer: Self-pay | Admitting: Internal Medicine

## 2011-03-02 ENCOUNTER — Encounter: Payer: Self-pay | Admitting: Internal Medicine

## 2011-03-02 ENCOUNTER — Ambulatory Visit (INDEPENDENT_AMBULATORY_CARE_PROVIDER_SITE_OTHER): Payer: BC Managed Care – PPO | Admitting: Internal Medicine

## 2011-03-02 VITALS — BP 110/88 | HR 65 | Temp 97.9°F | Ht 73.0 in | Wt 192.6 lb

## 2011-03-02 DIAGNOSIS — R002 Palpitations: Secondary | ICD-10-CM

## 2011-03-02 DIAGNOSIS — E785 Hyperlipidemia, unspecified: Secondary | ICD-10-CM

## 2011-03-02 DIAGNOSIS — I10 Essential (primary) hypertension: Secondary | ICD-10-CM

## 2011-03-02 NOTE — Patient Instructions (Addendum)
Cerumenex is a wax removal kit that should be available for as needed use over the counter  Return fasting for your lab work this week  Be sure your multiple vitamin has at least 400 units of D  If you feel light headed standing try reducing your losartan to one half daily  We can see you yearly for follow up at this point

## 2011-03-02 NOTE — Progress Notes (Signed)
Subjective:     Patient ID: Cory Vasquez, male   DOB: 1946-01-23, 65 y.o.   MRN: [id number]  HPI    81 yowm never smoker with history of palpitations and hypertension and nonsustained ventricular tachycardia responsive to beta blockers.   November 02, 2008 ov cough after doubled lisinopril assoc with intense sore throat withouth sign nasal symptoms, sob, excess mucus or purulent sputum. No itching / sneezing. rec d/c ace, resolved  03/02/11 cpx/ Natasa Stigall  Cc occ light headedness if stands up too quickly.  No sob.  Pt denies any significant sore throat, dysphagia, itching, sneezing,  nasal congestion or excess/ purulent secretions,  fever, chills, sweats, unintended wt loss, pleuritic or exertional cp, hempoptysis, orthopnea pnd or leg swelling.    Also denies any obvious fluctuation of symptoms with weather or environmental changes or other aggravating or alleviating factors.         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  HENT: Negative for hearing loss, ear pain, nosebleeds, congestion, sore throat, facial swelling, rhinorrhea, sneezing, mouth sores, trouble swallowing, neck pain, neck stiffness, dental problem, voice change, postnasal drip, sinus pressure, tinnitus and ear discharge.   Eyes: Negative for photophobia, discharge, itching and visual  disturbance.  Respiratory: Negative for apnea, cough, choking, chest tightness, shortness of breath, wheezing and stridor.   Cardiovascular: Negative for chest pain, palpitations and leg swelling.  Gastrointestinal: Negative for nausea, vomiting, abdominal pain, constipation, blood in stool and abdominal distention.  Genitourinary: Negative for dysuria, urgency, frequency, hematuria, flank pain, decreased urine volume and difficulty urinating.  Musculoskeletal: Negative for myalgias, back pain, joint swelling, arthralgias and gait problem.  Skin: Positive for rash. Negative for color change and pallor.  Neurological: Positive for light-headedness. Negative for dizziness, tremors, seizures, syncope, speech difficulty, weakness, numbness and headaches.  Hematological: Negative for adenopathy. Does not bruise/bleed easily.  Psychiatric/Behavioral: Negative for confusion, sleep disturbance and agitation. The patient is not nervous/anxious.        Objective:   Physical Exam    wt   201 11/01/08 > 185 Dec 10, 2009> 186 January 23, 2010 > 185 January 23, 2010 >191 September 29, 2010 > 192 03/02/2011  anxious wm in nad  HEENT: nl dentition, turbinates, and orophanx. throat completely unremarkable,  Nl nasal mucosa Neck without JVD/Nodes/TM .  Wax impacting L ear Lungs clear to A and P bilaterally without cough on insp or exp maneuvers  RRR no s3 or murmur or increase in P2  Abd soft and benign with nl excursion in the supine position. No bruits or organomegaly fem pulses intact  Ext warm without calf tenderness, cyanosis clubbing or edema, nl pulses  Neuro alert, approp, no deficits Rectal: minimal bph stool G neg Testes down bilaterally, no ih, no nodules MS  Nl gait, no restrictions  cxr 03/02/11  No acute cardiopulmonary process is identified. Diaphragm is low- lying. I cannot exclude slight hyperinflation. 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 Osteophytes are  present in the spine.    Assessment:       Plan:

## 2011-03-03 ENCOUNTER — Ambulatory Visit (INDEPENDENT_AMBULATORY_CARE_PROVIDER_SITE_OTHER)
Admission: RE | Admit: 2011-03-03 | Discharge: 2011-03-03 | Disposition: A | Discharge status: Home / Self Care | Payer: BC Managed Care – PPO | Source: Ambulatory Visit | Attending: Internal Medicine | Admitting: Internal Medicine

## 2011-03-03 ENCOUNTER — Other Ambulatory Visit (INDEPENDENT_AMBULATORY_CARE_PROVIDER_SITE_OTHER): Payer: BC Managed Care – PPO

## 2011-03-03 ENCOUNTER — Encounter: Payer: Self-pay | Admitting: Internal Medicine

## 2011-03-03 DIAGNOSIS — I10 Essential (primary) hypertension: Secondary | ICD-10-CM

## 2011-03-03 LAB — URINALYSIS
Bilirubin Urine: NEGATIVE
Leukocytes, UA: NEGATIVE
Specific Gravity, Urine: 1.025 (ref 1.000–1.030)
Total Protein, Urine: NEGATIVE
Urine Glucose: NEGATIVE
pH: 6 (ref 5.0–8.0)

## 2011-03-03 LAB — HEPATIC FUNCTION PANEL
AST: 25 U/L (ref 0–37)
Alkaline Phosphatase: 65 U/L (ref 39–117)
Bilirubin, Direct: 0.1 mg/dL (ref 0.0–0.3)
Total Protein: 6.7 g/dL (ref 6.0–8.3)

## 2011-03-03 LAB — CBC WITH DIFFERENTIAL/PLATELET
Basophils Absolute: 0 10*3/uL (ref 0.0–0.1)
Basophils Relative: 0.3 % (ref 0.0–3.0)
Eosinophils Absolute: 0.2 10*3/uL (ref 0.0–0.7)
Lymphocytes Relative: 37.2 % (ref 12.0–46.0)
MCHC: 33.7 g/dL (ref 30.0–36.0)
Monocytes Relative: 9 % (ref 3.0–12.0)
Neutrophils Relative %: 50.2 % (ref 43.0–77.0)
RBC: 4.57 Mil/uL (ref 4.22–5.81)
WBC: 5.4 10*3/uL (ref 4.5–10.5)

## 2011-03-03 LAB — BASIC METABOLIC PANEL
CO2: 29 mEq/L (ref 19–32)
Calcium: 9.1 mg/dL (ref 8.4–10.5)
Creatinine, Ser: 0.9 mg/dL (ref 0.4–1.5)
GFR: 92.67 mL/min (ref >60.00)

## 2011-03-03 LAB — LIPID PANEL
Cholesterol: 176 mg/dL (ref 0–200)
HDL: 53.6 mg/dL (ref >39.00)
Triglycerides: 88 mg/dL (ref 0.0–149.0)
VLDL: 17.6 mg/dL (ref 0.0–40.0)

## 2011-03-03 LAB — TSH: TSH: 0.81 u[IU]/mL (ref 0.35–5.50)

## 2011-03-03 NOTE — Assessment & Plan Note (Signed)
Adequate control on present rx, reviewed  

## 2011-03-03 NOTE — Assessment & Plan Note (Signed)
Adequate control on present rx, reviewed - may be occ overcontrolled so ok to break in half the losartan if orthostatic

## 2011-03-04 NOTE — Progress Notes (Signed)
Quick Note:  Spoke with pt and notified of results per Dr. [name]. Pt verbalized understanding and denied any questions.  ______ 

## 2011-03-12 ENCOUNTER — Telehealth: Payer: Self-pay | Admitting: Internal Medicine

## 2011-03-12 ENCOUNTER — Other Ambulatory Visit: Payer: Self-pay | Admitting: Internal Medicine

## 2011-03-12 MED ORDER — METOPROLOL TARTRATE 25 MG PO TABS: 25.0000 mg | ORAL_TABLET | Freq: Two times a day (BID) | ORAL | Status: DC

## 2011-03-12 MED ORDER — LOSARTAN POTASSIUM-HCTZ 100-25 MG PO TABS: 1.0000 | ORAL_TABLET | Freq: Every day | ORAL | Status: DC

## 2011-03-12 NOTE — Telephone Encounter (Signed)
Pt needs refills on metoprolol and losartan. Refill sent. Pt aware. Carron Curie, CMA

## 2011-05-05 LAB — COMPREHENSIVE METABOLIC PANEL
Albumin: 4.1
Alkaline Phosphatase: 87
BUN: 23
Chloride: 104
Creatinine, Ser: 1.1
Glucose, Bld: 88
Potassium: 4
Total Bilirubin: 0.6
Total Protein: 7.2

## 2011-05-05 LAB — DIFFERENTIAL
Basophils Absolute: 0
Basophils Relative: 0
Lymphocytes Relative: 37
Monocytes Absolute: 0.7
Neutro Abs: 5
Neutrophils Relative %: 55

## 2011-05-05 LAB — URINALYSIS, ROUTINE W REFLEX MICROSCOPIC
Hgb urine dipstick: NEGATIVE
Nitrite: NEGATIVE
Specific Gravity, Urine: 1.03
Urobilinogen, UA: 0.2
pH: 5.5

## 2011-05-05 LAB — CBC
HCT: 46.1
Hemoglobin: 15.8
MCV: 93
Platelets: 402 — ABNORMAL HIGH
RDW: 12.5

## 2011-05-05 LAB — APTT: aPTT: 24

## 2011-05-05 LAB — TYPE AND SCREEN: ABO/RH(D): O POS

## 2011-05-05 LAB — PROTIME-INR: INR: 0.9

## 2011-06-05 ENCOUNTER — Encounter: Payer: Self-pay | Admitting: Adult Health

## 2011-06-05 ENCOUNTER — Ambulatory Visit (INDEPENDENT_AMBULATORY_CARE_PROVIDER_SITE_OTHER): Payer: BC Managed Care – PPO | Admitting: Adult Health

## 2011-06-05 DIAGNOSIS — I10 Essential (primary) hypertension: Secondary | ICD-10-CM

## 2011-06-08 ENCOUNTER — Encounter: Payer: Self-pay | Admitting: Adult Health

## 2011-06-08 ENCOUNTER — Ambulatory Visit (INDEPENDENT_AMBULATORY_CARE_PROVIDER_SITE_OTHER): Payer: BC Managed Care – PPO | Admitting: Adult Health

## 2011-06-08 VITALS — BP 106/68 | Temp 97.5°F | Ht 73.0 in | Wt 198.7 lb

## 2011-06-08 DIAGNOSIS — H6120 Impacted cerumen, unspecified ear: Secondary | ICD-10-CM

## 2011-06-08 NOTE — Patient Instructions (Signed)
May use Debrox for ear wax  As needed   follow up Dr. [name]  As planned and  As needed

## 2011-06-08 NOTE — Progress Notes (Signed)
Subjective:     Patient ID: Cory Vasquez, male   DOB: 01-May-1946, 65 y.o.   MRN: [id number]  HPI 52 yowm never smoker with history of palpitations and hypertension and nonsustained ventricular tachycardia responsive to beta blockers.   November 02, 2008 ov cough after doubled lisinopril assoc with intense sore throat withouth sign nasal symptoms, sob, excess mucus or purulent sputum. No itching / sneezing. rec d/c ace, resolved  03/02/11 cpx/ [name]  Cc occ light headedness if stands up too quickly.  No sob. >>no changes, labs done   06/08/2011 Acute OV  Complains of stopped up ears, esp on left. Decreased hearing and pain/off. No drainage or fever.  No sinus pain or pressure. NO otc used.         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  HEENT:   No headaches,  Difficulty swallowing,  Tooth/dental problems, or  Sore throat,                No sneezing, itching,  nasal congestion, post nasal drip,   CV:  No chest pain,  Orthopnea, PND, swelling in lower extremities, anasarca, dizziness, palpitations, syncope.   GI  No heartburn, indigestion, abdominal pain, nausea, vomiting, diarrhea, change in bowel habits, loss of appetite, bloody stools.   Resp: No shortness of breath with exertion or at rest.  No excess mucus, no productive cough,  No non-productive cough,  No coughing up of  blood.  No change in color of mucus.  No wheezing.  No chest wall deformity  Skin: no rash or lesions.  GU: no dysuria, change in color of urine, no urgency or frequency.  No flank pain, no hematuria   MS:  No joint pain or swelling.  No decreased range of motion.  No back pain.  Psych:  No change in mood or affect. No depression or anxiety.  No memory loss.          Objective:   Physical Exam    wt   201 11/01/08 > 185 Dec 10, 2009> 186 January 23, 2010 > 185 January 23, 2010 >191 September 29, 2010 > 192 03/02/2011 >06/08/2011  wm in nad  HEENT: nl dentition, turbinates, and orophanx. throat completely unremarkable,  Nl nasal mucosa Neck without JVD/Nodes/TM .  Wax impaction of L ear, R. Ear clear  Lungs clear to A and P bilaterally without cough on insp or exp maneuvers  RRR no s3 or murmur or increase in P2  Abd soft and benign with nl excursion in the supine position. No bruits or organomegaly fem pulses intact  Ext warm without calf tenderness, cyanosis clubbing or edema, nl pulses      Assessment:       Plan:

## 2011-06-08 NOTE — Progress Notes (Signed)
  Subjective:    Patient ID: Cory Vasquez, male    DOB: 1945-11-28, 65 y.o.   MRN: [id number]  HPI Pt not seen rescheduled    Review of Systems     Objective:   Physical Exam        Assessment & Plan:

## 2011-06-08 NOTE — Assessment & Plan Note (Signed)
Pt not seen- rescheduled.  

## 2011-06-08 NOTE — Assessment & Plan Note (Signed)
Left cerumen impaction with ear irrigation with sucessful removal.  Tolerated well.  Advised to use debrox As needed

## 2011-06-15 ENCOUNTER — Other Ambulatory Visit: Payer: Self-pay | Admitting: Internal Medicine

## 2012-03-03 ENCOUNTER — Ambulatory Visit (INDEPENDENT_AMBULATORY_CARE_PROVIDER_SITE_OTHER)
Admission: RE | Admit: 2012-03-03 | Discharge: 2012-03-03 | Disposition: A | Discharge status: Home / Self Care | Payer: Medicare Other | Source: Ambulatory Visit | Attending: Internal Medicine | Admitting: Internal Medicine

## 2012-03-03 ENCOUNTER — Encounter: Payer: Self-pay | Admitting: Internal Medicine

## 2012-03-03 ENCOUNTER — Ambulatory Visit (INDEPENDENT_AMBULATORY_CARE_PROVIDER_SITE_OTHER): Payer: Medicare Other | Admitting: Internal Medicine

## 2012-03-03 ENCOUNTER — Other Ambulatory Visit (INDEPENDENT_AMBULATORY_CARE_PROVIDER_SITE_OTHER): Payer: Medicare Other

## 2012-03-03 VITALS — BP 108/62 | HR 60 | Temp 97.4°F | Ht 72.5 in | Wt 193.8 lb

## 2012-03-03 DIAGNOSIS — K5732 Diverticulitis of large intestine without perforation or abscess without bleeding: Secondary | ICD-10-CM

## 2012-03-03 DIAGNOSIS — E785 Hyperlipidemia, unspecified: Secondary | ICD-10-CM

## 2012-03-03 DIAGNOSIS — R002 Palpitations: Secondary | ICD-10-CM

## 2012-03-03 DIAGNOSIS — I10 Essential (primary) hypertension: Secondary | ICD-10-CM

## 2012-03-03 DIAGNOSIS — H6120 Impacted cerumen, unspecified ear: Secondary | ICD-10-CM

## 2012-03-03 LAB — URINALYSIS
Bilirubin Urine: NEGATIVE
Leukocytes, UA: NEGATIVE
Nitrite: NEGATIVE
Specific Gravity, Urine: 1.015 (ref 1.000–1.030)
pH: 6.5 (ref 5.0–8.0)

## 2012-03-03 LAB — CBC WITH DIFFERENTIAL/PLATELET
Basophils Absolute: 0 10*3/uL (ref 0.0–0.1)
Eosinophils Relative: 2.9 % (ref 0.0–5.0)
Hemoglobin: 15.5 g/dL (ref 13.0–17.0)
Lymphocytes Relative: 37.4 % (ref 12.0–46.0)
Monocytes Relative: 8.8 % (ref 3.0–12.0)
Neutro Abs: 3.4 10*3/uL (ref 1.4–7.7)
Platelets: 284 10*3/uL (ref 150.0–400.0)
RDW: 12.7 % (ref 11.5–14.6)
WBC: 6.7 10*3/uL (ref 4.5–10.5)

## 2012-03-03 LAB — BASIC METABOLIC PANEL
BUN: 22 mg/dL (ref 6–23)
Chloride: 102 mEq/L (ref 96–112)
GFR: 86.68 mL/min (ref >60.00)
Potassium: 4.8 mEq/L (ref 3.5–5.1)
Sodium: 137 mEq/L (ref 135–145)

## 2012-03-03 LAB — LIPID PANEL
Cholesterol: 188 mg/dL (ref 0–200)
HDL: 55.3 mg/dL (ref >39.00)
LDL Cholesterol: 110 mg/dL — ABNORMAL HIGH (ref 0–99)
Triglycerides: 115 mg/dL (ref 0.0–149.0)
VLDL: 23 mg/dL (ref 0.0–40.0)

## 2012-03-03 LAB — TSH: TSH: 0.72 u[IU]/mL (ref 0.35–5.50)

## 2012-03-03 LAB — HEPATIC FUNCTION PANEL
ALT: 25 U/L (ref 0–53)
Albumin: 4 g/dL (ref 3.5–5.2)
Total Protein: 7.2 g/dL (ref 6.0–8.3)

## 2012-03-03 NOTE — Progress Notes (Signed)
Quick Note:  Spoke with pt and notified of results per Dr. [name]. Pt verbalized understanding and denied any questions.  ______ 

## 2012-03-03 NOTE — Assessment & Plan Note (Signed)
-   Target LDL < 130 as male, hbp, pos fm hx  Adequate control on present rx, reviewed no meds needed

## 2012-03-03 NOTE — Patient Instructions (Addendum)
Please remember to go to the lab and x-ray department downstairs for your tests - we will call you with the results when they are available.    Exercise 30 min 3x weekly at a level where you are continuously short of breath of breath but not out of breath  Return yearly for comprehensive evaluation, sooner if needed

## 2012-03-03 NOTE — Progress Notes (Signed)
Subjective:     Patient ID: Cory Vasquez, male   DOB: 11-18-1945 .   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   November 02, 2008 ov cough after doubled lisinopril assoc with intense sore throat withouth sign nasal symptoms, sob, excess mucus or purulent sputum. No itching / sneezing. rec d/c ace, resolved  03/02/11 cpx/ [name]  Cc occ light headedness if stands up too quickly.  No sob. >>no changes, labs done   06/08/2011 Acute OV  Complains of stopped up ears, esp on left. Decreased hearing and pain/off. No drainage or fever.  No sinus pain or pressure. NO otc used.  rec May use Debrox for ear wax  As needed    03/03/2012 f/u ov/[name] cc f/u hbp, hyperlipidemia.  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 throat completely unremarkable,  Nl nasal mucosa Neck without JVD/Nodes/TM .  Wax impaction of L ear, R. Ear clear  Lungs clear to A and P bilaterally without cough on insp or exp maneuvers  RRR no s3 or murmur or increase in P2  Abd soft and benign with nl excursion in the supine position. No bruits or organomegaly fem  pulses intact  Ext warm without calf tenderness, cyanosis clubbing or edema, nl pulses  GU nl testes, no IH Rectal min bph, smooth, stool G neg MS nl gait, station, no deform Neuro intact sensorium, no motor or cerebellar def   CXR  03/03/2012 : No active cardiopulmonary disease.      Assessment:       Plan:

## 2012-03-03 NOTE — Assessment & Plan Note (Signed)
Min on L, f/u prn if otc's not helping

## 2012-03-03 NOTE — Assessment & Plan Note (Signed)
-   neg myoview 05/2004 with EF 58%   No recurrence on low dose Beta blocker

## 2012-03-03 NOTE — Assessment & Plan Note (Signed)
Adequate control on present rx, reviewed  

## 2012-03-28 ENCOUNTER — Other Ambulatory Visit: Payer: Self-pay | Admitting: Internal Medicine

## 2012-04-30 ENCOUNTER — Other Ambulatory Visit: Payer: Self-pay | Admitting: Internal Medicine

## 2012-05-24 ENCOUNTER — Telehealth: Payer: Self-pay | Admitting: Cardiology

## 2012-05-24 NOTE — Telephone Encounter (Signed)
New Problem:    Patient called in wanting to know if it would be ok to take pain medication with his BP meds.  Please call back.

## 2012-05-24 NOTE — Telephone Encounter (Signed)
Pt. Advised that it is ok to take Advil for pain but would be better if he takes Tylenol, he verbalized understanding.

## 2012-05-24 NOTE — Telephone Encounter (Signed)
**Note De-identified Cory Vasquez Obfuscation** LMTCB

## 2012-09-26 ENCOUNTER — Ambulatory Visit (INDEPENDENT_AMBULATORY_CARE_PROVIDER_SITE_OTHER): Payer: Medicare Other | Admitting: Internal Medicine

## 2012-09-26 ENCOUNTER — Encounter: Payer: Self-pay | Admitting: Internal Medicine

## 2012-09-26 VITALS — BP 108/70 | HR 61 | Temp 97.9°F | Ht 72.0 in | Wt 196.0 lb

## 2012-09-26 DIAGNOSIS — J069 Acute upper respiratory infection, unspecified: Secondary | ICD-10-CM | POA: Insufficient documentation

## 2012-09-26 MED ORDER — AZITHROMYCIN 250 MG PO TABS: ORAL_TABLET | ORAL | Status: DC

## 2012-09-26 NOTE — Patient Instructions (Addendum)
If fever, worse symptoms over next 72 hours then take zpak  Zyrtec works for drippy nose  Mucinex or mucinex dm is good for cough and thick mucus  Stuffy nose > saline nasal spring  Follow up CPX due after 03/03/13

## 2012-09-26 NOTE — Assessment & Plan Note (Signed)
Explained natural history of uri and why it's necessary in patients at risk to avoid triggering cyclical cough initially  triggered by epithelial injury and a heightened sensitivty to the effects of any upper airway irritants,  most importantly acid - related.  That is, the more sensitive the epithelium damaged for virus, the more the cough, the more the secondary reflux (especially in those prone to reflux) the more the irritation of the sensitive mucosa and so on in a cyclical pattern.   Also reviewed how to manage symptoms and when / if to start zpak for what amounts now to a viral process.  Note ace cough hx suggests may be an irritable larynx / cyclical cough tendency

## 2012-09-26 NOTE — Assessment & Plan Note (Signed)
Adequate control on present rx, reviewed  

## 2012-09-26 NOTE — Progress Notes (Signed)
Subjective:     Patient ID: Cory Vasquez, male   DOB: 1945-10-23 .   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   November 02, 2008 ov cough after doubled lisinopril assoc with intense sore throat withouth sign nasal symptoms, sob, excess mucus or purulent sputum. No itching / sneezing. rec d/c ace, resolved  03/02/11 cpx/ [name]  Cc occ light headedness if stands up too quickly.  No sob. >>no changes, labs done   06/08/2011 Acute OV  Complains of stopped up ears, esp on left. Decreased hearing and pain/off. No drainage or fever.  No sinus pain or pressure. NO otc used.  rec May use Debrox for ear wax  As needed    03/03/2012 f/u ov/[name] cc f/u hbp, hyperlipidemia.  Ex = walking  rec Increase aerobics, no change rx   09/26/2012 f/u ov/[name] cc acute onset/ pnd st/ nasal congestion no purulent sputum, fever, sob  Sleeping ok without nocturnal  or early am exacerbation  of respiratory  c/o's or need for noct saba. Also denies any obvious fluctuation of symptoms with weather or environmental changes or other aggravating or alleviating factors except as outlined above   ROS  The following are not active complaints unless bolded sore throat, dysphagia, dental problems, itching, sneezing,  nasal congestion or excess/ purulent secretions, ear ache,   fever, chills, sweats, unintended wt loss, pleuritic or exertional cp, hemoptysis,  orthopnea pnd or leg swelling, presyncope, palpitations, heartburn, abdominal pain, anorexia, nausea, vomiting, diarrhea  or change in bowel or urinary habits, change in stools or urine, dysuria,hematuria,  rash, arthralgias, visual complaints, headache, numbness weakness or ataxia or problems with walking or coordination,  change in mood/affect or memory.              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  Wax impaction of L ear, R. Ear clear  Lungs clear to A and P bilaterally without cough on insp or exp maneuvers  RRR no s3 or murmur or increase in P2  Abd soft and benign with nl excursion in the supine position. No bruits or organomegaly fem pulses intact  Ext warm without calf tenderness, cyanosis clubbing or edema, nl pulses      CXR  03/03/2012 : No active cardiopulmonary disease      Assessment:       Plan:

## 2012-10-08 ENCOUNTER — Other Ambulatory Visit: Payer: Self-pay | Admitting: Internal Medicine

## 2012-10-12 ENCOUNTER — Telehealth: Payer: Self-pay | Admitting: Internal Medicine

## 2012-10-12 MED ORDER — LOSARTAN POTASSIUM-HCTZ 100-25 MG PO TABS: ORAL_TABLET | ORAL | Status: DC

## 2012-10-12 NOTE — Telephone Encounter (Signed)
Pt returned triage's call.  Holly D Pryor ° °

## 2012-10-12 NOTE — Telephone Encounter (Signed)
I spoke with pt. Aware rx has been sent. Nothing further was needed

## 2012-10-12 NOTE — Telephone Encounter (Signed)
[id number] x1 

## 2012-11-08 ENCOUNTER — Ambulatory Visit (INDEPENDENT_AMBULATORY_CARE_PROVIDER_SITE_OTHER): Payer: Medicare Other | Admitting: Internal Medicine

## 2012-11-08 ENCOUNTER — Encounter: Payer: Self-pay | Admitting: Internal Medicine

## 2012-11-08 VITALS — BP 120/78 | HR 55 | Temp 97.4°F | Ht 72.0 in | Wt 197.9 lb

## 2012-11-08 DIAGNOSIS — L0293 Carbuncle, unspecified: Secondary | ICD-10-CM

## 2012-11-08 MED ORDER — DOXYCYCLINE HYCLATE 100 MG PO TABS: 100.0000 mg | ORAL_TABLET | Freq: Two times a day (BID) | ORAL | Status: DC

## 2012-11-08 NOTE — Patient Instructions (Addendum)
Doxycline 100 twice daily and warm slt salty compresses 4 x daily   If not improving by the 4/21 you will need to call your dermatologist for appointment  Comprehensive evaluation is due after  August 8 th - we can do that here or refer you to one of our primary care doctors, whichever you prefer

## 2012-11-08 NOTE — Progress Notes (Signed)
Subjective:     Patient ID: Cory Vasquez, male   DOB: June 26, 1946 .   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   November 02, 2008 ov cough after doubled lisinopril assoc with intense sore throat withouth sign nasal symptoms, sob, excess mucus or purulent sputum. No itching / sneezing. rec d/c ace, resolved  03/02/11 cpx/ Christelle Igoe  Cc occ light headedness if stands up too quickly.  No sob. >>no changes, labs done   06/08/2011 Acute OV  Complains of stopped up ears, esp on left. Decreased hearing and pain/off. No drainage or fever.  No sinus pain or pressure. NO otc used.  rec May use Debrox for ear wax  As needed    03/03/2012 f/u ov/Jasneet Schobert cc f/u hbp, hyperlipidemia.  Ex = walking  rec Increase aerobics, no change rx   11/08/2012 f/u ov/Gilliam Hawkes new problem Chief Complaint  Patient presents with  . Follow-up    red bump under right arm that is tender to touch.   noted in shower one day prior to OV doesn't know how long present, no pain unless touches it,  no fever, no gardening or shoulder pain.  ROS  The following are not active complaints unless bolded sore throat, dysphagia, dental problems, itching, sneezing,  nasal congestion or excess/ purulent secretions, ear ache,   fever, chills, sweats, unintended wt loss, pleuritic or exertional cp, hemoptysis,  orthopnea pnd or leg swelling, presyncope, palpitations, heartburn, abdominal pain, anorexia, nausea, vomiting, diarrhea  or change in bowel or urinary habits, change in stools or urine, dysuria,hematuria,  rash, arthralgias, visual complaints, headache, numbness weakness or ataxia or problems with walking or coordination,  change in mood/affect or memory.                   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  Wax impaction of L ear, R. Ear clear  Lungs clear to A and P bilaterally without cough on insp or exp maneuvers  RRR no s3 or murmur or increase in P2  Abd soft and benign with nl excursion in the supine position. No bruits or organomegaly fem pulses intact  Ext warm without calf tenderness, cyanosis clubbing or edema, nl pulses  Skin  Dime sized carbuncle R axilla, no nodes or other lesions     CXR  03/03/2012 :  No active cardiopulmonary disease      Assessment:       Plan:

## 2012-11-10 DIAGNOSIS — L0293 Carbuncle, unspecified: Secondary | ICD-10-CM | POA: Insufficient documentation

## 2012-11-10 NOTE — Assessment & Plan Note (Signed)
Classic, likely related to folliculitis from axilllary hair  rec warm compresses and doxy, f/u derm prn

## 2013-01-23 ENCOUNTER — Encounter: Payer: Self-pay | Admitting: Internal Medicine

## 2013-01-23 ENCOUNTER — Ambulatory Visit (INDEPENDENT_AMBULATORY_CARE_PROVIDER_SITE_OTHER): Payer: Medicare Other | Admitting: Internal Medicine

## 2013-01-23 VITALS — BP 112/70 | HR 55 | Temp 97.8°F | Ht 72.0 in | Wt 196.8 lb

## 2013-01-23 DIAGNOSIS — L0292 Furuncle, unspecified: Secondary | ICD-10-CM

## 2013-01-23 DIAGNOSIS — L0293 Carbuncle, unspecified: Secondary | ICD-10-CM

## 2013-01-23 MED ORDER — DOXYCYCLINE HYCLATE 100 MG PO TABS: 100.0000 mg | ORAL_TABLET | Freq: Two times a day (BID) | ORAL | Status: DC

## 2013-01-23 NOTE — Progress Notes (Signed)
Subjective:     Patient ID: Cory Vasquez, male   DOB: [birth year] .   MRN: [id number]  Brief patient profile:  37 yowm never smoker with history of palpitations and hypertension and nonsustained ventricular tachycardia responsive to beta blockers.   November 02, 2008 ov cough after doubled lisinopril assoc with intense sore throat withouth sign nasal symptoms, sob, excess mucus or purulent sputum. No itching / sneezing. rec d/c ace, resolved  03/02/11 cpx/ [name]  Cc occ light headedness if stands up too quickly.  No sob. >>no changes, labs done   06/08/2011 Acute OV  Complains of stopped up ears, esp on left. Decreased hearing and pain/off. No drainage or fever.  No sinus pain or pressure. NO otc used.  rec May use Debrox for ear wax  As needed    03/03/2012 f/u ov/[name] cc f/u hbp, hyperlipidemia.  Ex = walking  rec Increase aerobics, no change rx   11/08/2012 f/u ov/[name] new problem Chief Complaint  Patient presents with  . Follow-up    red bump under right arm that is tender to touch.   noted in shower one day prior to OV doesn't know how long present, no pain unless touches it,  no fever, no gardening or shoulder pain. rec    01/23/2013 f/u ov/[name] re folliculitis Chief Complaint  Patient presents with  . Acute Visit    area under right arm - red, swollen, warm to touch, and stings.  No f/c/s.   onset x 4 days similar to previous carbuncles but not in exact same location.    Also denies any obvious fluctuation of symptoms with weather or environmental changes or other aggravating or alleviating factors except as outlined above   ROS  The following are not active complaints unless bolded sore throat, dysphagia, dental problems, itching, sneezing,  nasal congestion or excess/ purulent secretions, ear ache,   fever, chills, sweats, unintended wt loss, pleuritic or exertional cp, hemoptysis,  orthopnea pnd or leg swelling, presyncope, palpitations, heartburn, abdominal pain, anorexia,  nausea, vomiting, diarrhea  or change in bowel or urinary habits, change in stools or urine, dysuria,hematuria,  rash, arthralgias, visual complaints, headache, numbness weakness or ataxia or problems with walking or coordination,  change in mood/affect or memory.                   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  Wax impaction  of L ear, R. Ear clear  Lungs clear to A and P bilaterally without cough on insp or exp maneuvers  RRR no s3 or murmur or increase in P2  Abd soft and benign with nl excursion in the supine position. No bruits or organomegaly fem pulses intact  Ext warm without calf tenderness, cyanosis clubbing or edema, nl pulses  Skin  Quarter ized carbuncle R axilla, no nodes or other lesions, pus expressed but not really fluctuent     CXR  03/03/2012 : No active cardiopulmonary disease      Assessment:

## 2013-01-23 NOTE — Patient Instructions (Addendum)
Doxycline 100 twice daily x 10 days and warm slt salty compresses 4 x daily - if not improving after a week you will need to see your dermatologist  Comprehensive evaluation is due after  August 8 th

## 2013-01-24 NOTE — Assessment & Plan Note (Signed)
Classic folliculitis R axilla previously responded in different area after rx with doxy > repeat rx and derm f/u rec

## 2013-03-06 ENCOUNTER — Ambulatory Visit (INDEPENDENT_AMBULATORY_CARE_PROVIDER_SITE_OTHER): Payer: Medicare Other | Admitting: Internal Medicine

## 2013-03-06 ENCOUNTER — Encounter: Payer: Self-pay | Admitting: Internal Medicine

## 2013-03-06 ENCOUNTER — Other Ambulatory Visit (INDEPENDENT_AMBULATORY_CARE_PROVIDER_SITE_OTHER): Payer: Medicare Other

## 2013-03-06 ENCOUNTER — Ambulatory Visit (INDEPENDENT_AMBULATORY_CARE_PROVIDER_SITE_OTHER)
Admission: RE | Admit: 2013-03-06 | Discharge: 2013-03-06 | Disposition: A | Discharge status: Home / Self Care | Payer: Medicare Other | Source: Ambulatory Visit | Attending: Internal Medicine | Admitting: Internal Medicine

## 2013-03-06 VITALS — BP 136/80 | HR 55 | Temp 97.8°F | Ht 72.5 in | Wt 193.6 lb

## 2013-03-06 DIAGNOSIS — N402 Nodular prostate without lower urinary tract symptoms: Secondary | ICD-10-CM

## 2013-03-06 DIAGNOSIS — I10 Essential (primary) hypertension: Secondary | ICD-10-CM

## 2013-03-06 DIAGNOSIS — L0292 Furuncle, unspecified: Secondary | ICD-10-CM

## 2013-03-06 DIAGNOSIS — H6122 Impacted cerumen, left ear: Secondary | ICD-10-CM

## 2013-03-06 DIAGNOSIS — H6120 Impacted cerumen, unspecified ear: Secondary | ICD-10-CM

## 2013-03-06 DIAGNOSIS — Z0000 Encounter for general adult medical examination without abnormal findings: Secondary | ICD-10-CM

## 2013-03-06 DIAGNOSIS — L0293 Carbuncle, unspecified: Secondary | ICD-10-CM

## 2013-03-06 DIAGNOSIS — E785 Hyperlipidemia, unspecified: Secondary | ICD-10-CM

## 2013-03-06 LAB — URINALYSIS
Bilirubin Urine: NEGATIVE
Leukocytes, UA: NEGATIVE
Nitrite: NEGATIVE
Total Protein, Urine: NEGATIVE
Urobilinogen, UA: 0.2 (ref 0.0–1.0)

## 2013-03-06 LAB — HEPATIC FUNCTION PANEL
ALT: 25 U/L (ref 0–53)
AST: 24 U/L (ref 0–37)
Albumin: 3.9 g/dL (ref 3.5–5.2)
Total Protein: 6.7 g/dL (ref 6.0–8.3)

## 2013-03-06 LAB — CBC WITH DIFFERENTIAL/PLATELET
Basophils Relative: 0.3 % (ref 0.0–3.0)
Eosinophils Relative: 2.2 % (ref 0.0–5.0)
HCT: 44.8 % (ref 39.0–52.0)
Hemoglobin: 15 g/dL (ref 13.0–17.0)
Lymphs Abs: 1.8 10*3/uL (ref 0.7–4.0)
MCV: 96.8 fl (ref 78.0–100.0)
Monocytes Absolute: 0.5 10*3/uL (ref 0.1–1.0)
Monocytes Relative: 9.2 % (ref 3.0–12.0)
Neutro Abs: 3 10*3/uL (ref 1.4–7.7)
WBC: 5.4 10*3/uL (ref 4.5–10.5)

## 2013-03-06 LAB — BASIC METABOLIC PANEL
Chloride: 106 mEq/L (ref 96–112)
GFR: 70.45 mL/min (ref >60.00)
Potassium: 5.2 mEq/L — ABNORMAL HIGH (ref 3.5–5.1)
Sodium: 140 mEq/L (ref 135–145)

## 2013-03-06 LAB — LIPID PANEL
HDL: 52.2 mg/dL (ref >39.00)
Total CHOL/HDL Ratio: 3

## 2013-03-06 LAB — PSA: PSA: 1.29 ng/mL (ref 0.10–4.00)

## 2013-03-06 NOTE — Progress Notes (Signed)
Subjective:     Patient ID: Cory Vasquez, male   DOB: 04-10-1946 .   MRN: [id number]  Brief patient profile:  29 yowm never smoker with history of palpitations and hypertension and nonsustained ventricular tachycardia responsive to beta blockers.   November 02, 2008 ov cough after doubled lisinopril assoc with intense sore throat withouth sign nasal symptoms, sob, excess mucus or purulent sputum. No itching / sneezing. rec d/c ace, resolved  03/02/11 cpx/ Cory Vasquez  Cc occ light headedness if stands up too quickly.  No sob. >>no changes, labs done   06/08/2011 Acute OV  Complains of stopped up ears, esp on left. Decreased hearing and pain/off. No drainage or fever.  No sinus pain or pressure. NO otc used.  rec May use Debrox for ear wax  As needed    03/03/2012 f/u ov/Cory Vasquez cc f/u hbp, hyperlipidemia.  Ex = walking  rec Increase aerobics, no change rx   11/08/2012 f/u ov/Cory Vasquez new problem Chief Complaint  Patient presents with  . Follow-up    red bump under right arm that is tender to touch.   noted in shower one day prior to OV doesn't know how long present, no pain unless touches it,  no fever, no gardening or shoulder pain. rec Doxycline 100 twice daily and warm slt salty compresses 4 x daily > resolved    01/23/2013 f/u ov/Cory Vasquez re folliculitis Chief Complaint  Patient presents with  . Acute Visit    area under right arm - red, swollen, warm to touch, and stings.  No f/c/s.   onset x 4 days similar to previous carbuncles but not in exact same location. Rec Doxycline 100 twice daily x 10 days and warm slt salty compresses 4 x daily > resolved  03/06/2013  Cory Vasquez/ cpx Working out 4 x weekly, good aerobic tol, no palpitations, ex cp, tia, claudication, sob, cough     Also denies any obvious fluctuation of symptoms with weather or environmental changes or other aggravating or alleviating factors except as outlined above   Sleeping ok without nocturnal  or early am exacerbation  of  respiratory  C/o's  Also denies any obvious fluctuation of symptoms with weather or environmental changes or other aggravating or alleviating factors except as outlined above    Current Medications, Allergies, Past Medical History, Past Surgical History, Family History, and Social History were reviewed in [employer] record.  ROS  The following are not active complaints unless bolded sore throat, dysphagia, dental problems, itching, sneezing,  nasal congestion or excess/ purulent secretions, ear ache,   fever, chills, sweats, unintended wt loss, pleuritic or exertional cp, hemoptysis,  orthopnea pnd or leg swelling, presyncope, palpitations, heartburn, abdominal pain, anorexia, nausea, vomiting, diarrhea  or change in bowel or urinary habits, change in stools or urine, dysuria,hematuria,  rash, arthralgias, visual complaints, headache, numbness weakness or ataxia or problems with walking or coordination,  change in mood/affect or memory.                       Past Medical History:   HBP  - ACE d/c'd 11/02/08 cough/sorethroat > resolved  Palpitations  - neg myoview 05/2004 with EF 58%  Divertiuculosis  - see colonoscopy 11/2004 rec fu 11/2014  Health Maintenance...........................................Marland KitchenWert  - Td January 23, 2010  - CPX 03/06/2013   Past Surgical History:  Back surgery [name] 11/08    Family History:  Ht dz father in 39s, mother 29  DM and  HBP brother  no cancer   Social History:   never smoker  occ ETOH  Passenger transport manager still working                Objective:   Physical Exam  wt   201 11/01/08 > 185 Dec 10, 2009> 186 January 23, 2010 > 185 January 23, 2010 >191 September 29, 2010 > 192 03/02/2011 > 03/03/2012  193 > 09/26/2012 194 > 11/08/2012 197 >  196 01/24/2013 > 193  03/06/13 wm in nad / not toxic at all  HEENT: nl dentition, turbinates, and only mild erythema of  orophanx with no exudates   Nl nasal mucosa Neck without JVD/Nodes/TM .  Wax  impaction of L ear, R. Ear clear  Lungs clear to A and P bilaterally without cough on insp or exp maneuvers  RRR no s3 or murmur or increase in P2  Abd soft and benign with nl excursion in the supine position. No bruits or organomegaly fem pulses intact  Ext warm without calf tenderness, cyanosis clubbing or edema, nl pulses  Skin  No lesions, R ax carbuncle resolved, no ax nodes  Reduced Bilateral DP pulses  GU testes down bilaterally, no nodules Rectal mild bph with nodule at 8oclock MS nl gait, no deformities, restrictions Neuro: alert, approp, no deficits   CXR  03/06/2013 :  No active disease. No significant change       Assessment:

## 2013-03-06 NOTE — Patient Instructions (Addendum)
Please see patient coordinator before you leave today  to schedule urology evaluation for prostate nodule  Please remember to go to the lab and x-ray department downstairs for your tests - we will call you with the results when they are available.  Keep up the good work on your workouts!  See you in one year for comprehensive eval, sooner if needed

## 2013-03-07 NOTE — Assessment & Plan Note (Signed)
-   recurrent L ear > rec otcs and f/u with Tammy NP 2 weeks prn

## 2013-03-07 NOTE — Progress Notes (Signed)
Quick Note:  Spoke with pt and notified of results per Dr. [name]. Pt verbalized understanding and denied any questions.  ______ 

## 2013-03-07 NOTE — Assessment & Plan Note (Signed)
-   Target LDL < 130 as male, hbp, pos fm hx  Lab Results  Component Value Date   LDLCALC 102* 03/06/2013    Adequate control on present rx, reviewed > no change in rx needed

## 2013-03-07 NOTE — Assessment & Plan Note (Signed)
Onset 11/08/12 - recurrent 01/23/13 > resolved

## 2013-03-07 NOTE — Assessment & Plan Note (Signed)
Adequate control on present rx, reviewed > no change in rx needed  But could add back pm dose of lopressor for palp or any further bp elevations

## 2013-03-07 NOTE — Assessment & Plan Note (Signed)
Lab Results  Component Value Date   PSA 1.29 03/06/2013   PSA 1.05 03/12/2008   PSA 1.07 02/17/2007     8 oclock, pea sized > refer to urology

## 2013-04-18 ENCOUNTER — Other Ambulatory Visit: Payer: Self-pay | Admitting: Internal Medicine

## 2013-07-17 ENCOUNTER — Other Ambulatory Visit: Payer: Self-pay | Admitting: Internal Medicine

## 2013-07-25 ENCOUNTER — Telehealth: Payer: Self-pay | Admitting: Internal Medicine

## 2013-07-25 MED ORDER — AZITHROMYCIN 250 MG PO TABS: ORAL_TABLET | ORAL | Status: DC

## 2013-07-25 NOTE — Telephone Encounter (Signed)
zpak Should not take doxy (if using it ) while on zpak mucinex dm prn cough

## 2013-07-25 NOTE — Telephone Encounter (Signed)
Pt aware of recs. rx has been sent. Nothing further needed 

## 2013-07-25 NOTE — Telephone Encounter (Signed)
Spoke with pt. C/o sore throat, chest congestion, cough (can't bring phlem up), runny nose x 3 days. No chest tx, no wheezing, no increase SOB. No openings this week in office. Please advise MW thanks  No Known Allergies

## 2013-07-25 NOTE — Telephone Encounter (Signed)
lmomtcb x1 for pt 

## 2013-07-25 NOTE — Telephone Encounter (Signed)
Pt returned triage's call & can be reached at [phone].  Cory Vasquez

## 2014-03-22 ENCOUNTER — Other Ambulatory Visit (INDEPENDENT_AMBULATORY_CARE_PROVIDER_SITE_OTHER): Payer: Medicare Other

## 2014-03-22 ENCOUNTER — Ambulatory Visit (INDEPENDENT_AMBULATORY_CARE_PROVIDER_SITE_OTHER)
Admission: RE | Admit: 2014-03-22 | Discharge: 2014-03-22 | Disposition: A | Discharge status: Home / Self Care | Payer: Medicare Other | Source: Ambulatory Visit | Attending: Internal Medicine | Admitting: Internal Medicine

## 2014-03-22 ENCOUNTER — Encounter: Payer: Self-pay | Admitting: Internal Medicine

## 2014-03-22 ENCOUNTER — Ambulatory Visit (INDEPENDENT_AMBULATORY_CARE_PROVIDER_SITE_OTHER): Payer: Medicare Other | Admitting: Internal Medicine

## 2014-03-22 VITALS — BP 122/78 | HR 60 | Temp 98.4°F | Ht 72.5 in | Wt 200.6 lb

## 2014-03-22 DIAGNOSIS — E785 Hyperlipidemia, unspecified: Secondary | ICD-10-CM

## 2014-03-22 DIAGNOSIS — J310 Chronic rhinitis: Secondary | ICD-10-CM

## 2014-03-22 DIAGNOSIS — I10 Essential (primary) hypertension: Secondary | ICD-10-CM

## 2014-03-22 DIAGNOSIS — H6120 Impacted cerumen, unspecified ear: Secondary | ICD-10-CM

## 2014-03-22 DIAGNOSIS — N402 Nodular prostate without lower urinary tract symptoms: Secondary | ICD-10-CM

## 2014-03-22 DIAGNOSIS — Z23 Encounter for immunization: Secondary | ICD-10-CM

## 2014-03-22 DIAGNOSIS — H6123 Impacted cerumen, bilateral: Secondary | ICD-10-CM

## 2014-03-22 LAB — CBC WITH DIFFERENTIAL/PLATELET
BASOS PCT: 0.2 % (ref 0.0–3.0)
Basophils Absolute: 0 10*3/uL (ref 0.0–0.1)
EOS ABS: 0.3 10*3/uL (ref 0.0–0.7)
EOS PCT: 3 % (ref 0.0–5.0)
HCT: 44 % (ref 39.0–52.0)
HEMOGLOBIN: 14.9 g/dL (ref 13.0–17.0)
LYMPHS PCT: 23.9 % (ref 12.0–46.0)
Lymphs Abs: 2 10*3/uL (ref 0.7–4.0)
MCHC: 33.8 g/dL (ref 30.0–36.0)
MCV: 95.5 fl (ref 78.0–100.0)
MONO ABS: 0.7 10*3/uL (ref 0.1–1.0)
Monocytes Relative: 8.2 % (ref 3.0–12.0)
NEUTROS ABS: 5.4 10*3/uL (ref 1.4–7.7)
NEUTROS PCT: 64.7 % (ref 43.0–77.0)
Platelets: 327 10*3/uL (ref 150.0–400.0)
RBC: 4.61 Mil/uL (ref 4.22–5.81)
RDW: 13.1 % (ref 11.5–15.5)
WBC: 8.3 10*3/uL (ref 4.0–10.5)

## 2014-03-22 LAB — HEPATIC FUNCTION PANEL
ALT: 26 U/L (ref 0–53)
AST: 27 U/L (ref 0–37)
Albumin: 3.7 g/dL (ref 3.5–5.2)
Alkaline Phosphatase: 82 U/L (ref 39–117)
BILIRUBIN DIRECT: 0.1 mg/dL (ref 0.0–0.3)
BILIRUBIN TOTAL: 0.8 mg/dL (ref 0.2–1.2)
Total Protein: 7.4 g/dL (ref 6.0–8.3)

## 2014-03-22 LAB — BASIC METABOLIC PANEL
BUN: 18 mg/dL (ref 6–23)
CALCIUM: 9.4 mg/dL (ref 8.4–10.5)
CHLORIDE: 103 meq/L (ref 96–112)
CO2: 27 meq/L (ref 19–32)
CREATININE: 1.1 mg/dL (ref 0.4–1.5)
GFR: 74.87 mL/min (ref >60.00)
GLUCOSE: 95 mg/dL (ref 70–99)
Potassium: 4.6 mEq/L (ref 3.5–5.1)
Sodium: 138 mEq/L (ref 135–145)

## 2014-03-22 LAB — LIPID PANEL
CHOL/HDL RATIO: 3
Cholesterol: 178 mg/dL (ref 0–200)
HDL: 59.8 mg/dL (ref >39.00)
LDL CALC: 104 mg/dL — AB (ref 0–99)
NONHDL: 118.2
Triglycerides: 72 mg/dL (ref 0.0–149.0)
VLDL: 14.4 mg/dL (ref 0.0–40.0)

## 2014-03-22 LAB — TSH: TSH: 0.98 u[IU]/mL (ref 0.35–4.50)

## 2014-03-22 NOTE — Assessment & Plan Note (Signed)
Adequate control on present rx, reviewed > no change in rx needed   

## 2014-03-22 NOTE — Assessment & Plan Note (Signed)
F/u Dr Risa Grill planned

## 2014-03-22 NOTE — Assessment & Plan Note (Signed)
-   Target LDL < 130 as male, hbp, pos fm hx  Lab Results  Component Value Date   CHOL 178 03/22/2014   HDL 59.80 03/22/2014   LDLCALC 104* 03/22/2014   TRIG 72.0 03/22/2014   CHOLHDL 3 03/22/2014

## 2014-03-22 NOTE — Progress Notes (Signed)
Subjective:     Patient ID: Cory Vasquez, male   DOB: [birth year] .   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     History of Present Illness  November 02, 2008 ov cough after doubled lisinopril assoc with intense sore throat withouth sign nasal symptoms, sob, excess mucus or purulent sputum. No itching / sneezing. rec d/c acei, resolved    11/08/2012 f/u ov/Marykay Mccleod new problem Chief Complaint  Patient presents with  . Follow-up    red bump under right arm that is tender to touch.   noted in shower one day prior to [place] doesn't know how long present, no pain unless touches it,  no fever, no gardening or shoulder pain. rec Doxycline 100 twice daily and warm slt salty compresses 4 x daily > resolved     03/22/2014 f/u ov/Lonell Stamos re:  Hbp/ rhinitis  Chief Complaint  Patient presents with  . Annual Exam    Pt states doing well and denies any co's. Fasting today.     no palpitations, ex cp, tia, claudication, sob, cough  No longer exercising   Sleeping ok without nocturnal  or early am exacerbation  of respiratory  C/o's  Also denies any obvious fluctuation of symptoms with weather or environmental changes or other aggravating or alleviating factors except as outlined above    Current Medications, Allergies, Past Medical History, Past Surgical History, Family History, and Social History were reviewed in [employer] record.  ROS  The following are not active complaints unless bolded sore throat, dysphagia, dental problems, itching, sneezing,  nasal congestion or excess/ purulent secretions, ear ache,   fever, chills, sweats, unintended wt loss, pleuritic or exertional cp, hemoptysis,  orthopnea pnd or leg swelling, presyncope, palpitations, heartburn, abdominal pain, anorexia, nausea, vomiting, diarrhea  or change in bowel or urinary  habits, change in stools or urine, dysuria,hematuria,  rash, arthralgias, visual complaints, headache, numbness weakness or ataxia or problems with walking or coordination,  change in mood/affect or memory.                       Past Medical History:   HBP  - ACE d/c'd 11/02/08 cough/sorethroat > resolved  Palpitations  - neg myoview 05/2004 with EF 58%  Divertiuculosis  - see colonoscopy 11/2004 rec fu 11/2014  Health Maintenance...........................................Marland KitchenWert  - Td January 23, 2010  - CPX  03/22/2014  - Prevnar rec  03/22/2014 > declined   Past Surgical History:  Back surgery [name] 11/08    Family History:  Ht dz father in 55s, mother 44  DM and HBP brother  no cancer   Social History:   never smoker  occ ETOH  Hotel manager still working                Objective:   Physical Exam  wt   201 11/01/08 > 185 Dec 10, 2009> 186 January 23, 2010 > 185 January 23, 2010 >191 September 29, 2010 > 192 03/02/2011 > 03/03/2012  193 > 09/26/2012 194 > 11/08/2012 197 >  196 01/24/2013 > 193  03/06/13 > 03/22/2014 201  wm in nad    HEENT: nl dentition, turbinates, and only mild erythema of  orophanx with no exudates   Nl nasal mucosa Neck without JVD/Nodes/TM .  Wax impaction of L ear >> R. Ear   Lungs clear  to A and P bilaterally without cough on insp or exp maneuvers  RRR no s3 or murmur or increase in P2  Abd soft and benign with nl excursion in the supine position. No bruits or organomegaly fem pulses intact  Ext warm without calf tenderness, cyanosis clubbing or edema, nl pulses  Skin  No lesions no ax nodes  Reduced Bilateral DP pulses  MS nl gait, no deformities, restrictions Neuro: alert, approp, no deficits   CXR  03/22/2014 :  The heart size and mediastinal contours are within normal limits. Both lungs are clear. No pneumothorax or pleural effusion is noted. The visualized skeletal structures are unremarkable.        Assessment:

## 2014-03-22 NOTE — Progress Notes (Signed)
Quick Note:  Spoke with pt and notified of results per Dr. [name]. Pt verbalized understanding and denied any questions.  ______ 

## 2014-03-22 NOTE — Assessment & Plan Note (Signed)
-   recurrent L ear 03/06/13  And 03/22/2014 > rec otcs and f/u prn

## 2014-03-22 NOTE — Patient Instructions (Addendum)
The bitter pill is great read you  Prevnar 13 today   Please remember to go to the lab and x-ray department downstairs for your tests - we will call you with the results when they are available.  Try the over the counter ear wax kit and see Tammy for follow up if the wax is sill bothering you   See you in one year

## 2014-05-01 ENCOUNTER — Other Ambulatory Visit: Payer: Self-pay | Admitting: Internal Medicine

## 2014-05-16 ENCOUNTER — Telehealth: Payer: Self-pay | Admitting: Internal Medicine

## 2014-05-16 NOTE — Telephone Encounter (Signed)
Pt is requesting a refill of his Viagra Last filled 05/2011 Pt last seen 03/22/14 [facility].   Please advise Dr Melvyn Novas. Thanks.

## 2014-05-17 ENCOUNTER — Other Ambulatory Visit: Payer: Self-pay | Admitting: *Deleted

## 2014-05-17 MED ORDER — SILDENAFIL CITRATE 50 MG PO TABS: 50.0000 mg | ORAL_TABLET | ORAL | Status: DC | PRN

## 2014-05-17 NOTE — Telephone Encounter (Signed)
Rx sent Nothing further needed.  

## 2014-05-17 NOTE — Telephone Encounter (Signed)
ok 

## 2014-07-12 ENCOUNTER — Other Ambulatory Visit: Payer: Self-pay | Admitting: Internal Medicine

## 2015-01-08 ENCOUNTER — Other Ambulatory Visit: Payer: Self-pay | Admitting: Internal Medicine

## 2015-01-10 ENCOUNTER — Telehealth: Payer: Self-pay | Admitting: Internal Medicine

## 2015-01-10 DIAGNOSIS — E785 Hyperlipidemia, unspecified: Secondary | ICD-10-CM

## 2015-01-10 DIAGNOSIS — J310 Chronic rhinitis: Secondary | ICD-10-CM

## 2015-01-10 DIAGNOSIS — I10 Essential (primary) hypertension: Secondary | ICD-10-CM

## 2015-01-10 NOTE — Telephone Encounter (Signed)
[phone] cb

## 2015-01-10 NOTE — Telephone Encounter (Signed)
Pt would like to have lab work prior to cpx appt with Dr Melvyn Novas on 03/14/15.   Please advise on what labs to order.

## 2015-01-10 NOTE — Telephone Encounter (Signed)
Hbp so ok to do bmet/ u/a/ lipid profile  Chronic rhinitis so do cbc with diff Hyperlipidemia so do lipid profile and tsh

## 2015-01-10 NOTE — Telephone Encounter (Signed)
Lab orders placed.  Pt aware to come to lab fasting 2-3 days prior to appt.

## 2015-01-10 NOTE — Telephone Encounter (Signed)
[email or web address]

## 2015-03-07 ENCOUNTER — Telehealth: Payer: Self-pay | Admitting: Internal Medicine

## 2015-03-07 DIAGNOSIS — K5732 Diverticulitis of large intestine without perforation or abscess without bleeding: Secondary | ICD-10-CM

## 2015-03-07 DIAGNOSIS — E785 Hyperlipidemia, unspecified: Secondary | ICD-10-CM

## 2015-03-07 DIAGNOSIS — I10 Essential (primary) hypertension: Secondary | ICD-10-CM

## 2015-03-07 NOTE — Telephone Encounter (Signed)
Pt is scheduled for physical on 03/26/15. Pt wants lab work done prior. Please advise MW thanks

## 2015-03-08 NOTE — Telephone Encounter (Signed)
Pt aware that he can come and have his labs done a week prior to [place] Lab orders placed. Nothing further needed.

## 2015-03-08 NOTE — Telephone Encounter (Signed)
Hypertension  U/A, bmet,  Hyperlipidemia   Tsh/ lfts, lipid profile  Diverticulitis :  Cbc with diff

## 2015-03-14 ENCOUNTER — Ambulatory Visit: Payer: Self-pay | Admitting: Internal Medicine

## 2015-03-20 ENCOUNTER — Encounter: Payer: Self-pay | Admitting: *Deleted

## 2015-03-20 ENCOUNTER — Ambulatory Visit (INDEPENDENT_AMBULATORY_CARE_PROVIDER_SITE_OTHER): Payer: Medicare Other | Admitting: Cardiology

## 2015-03-20 ENCOUNTER — Encounter: Payer: Self-pay | Admitting: Cardiology

## 2015-03-20 VITALS — BP 130/80 | HR 53 | Ht 72.0 in | Wt 195.3 lb

## 2015-03-20 DIAGNOSIS — R002 Palpitations: Secondary | ICD-10-CM

## 2015-03-20 DIAGNOSIS — R072 Precordial pain: Secondary | ICD-10-CM | POA: Diagnosis not present

## 2015-03-20 DIAGNOSIS — I10 Essential (primary) hypertension: Secondary | ICD-10-CM | POA: Diagnosis not present

## 2015-03-20 DIAGNOSIS — R079 Chest pain, unspecified: Secondary | ICD-10-CM | POA: Diagnosis not present

## 2015-03-20 MED ORDER — METOPROLOL TARTRATE 25 MG PO TABS: 25.0000 mg | ORAL_TABLET | Freq: Two times a day (BID) | ORAL | Status: DC

## 2015-03-20 NOTE — Progress Notes (Signed)
HPI: 69 year old male for evaluation of palpitations. Seen in the past but not since 2012. He did have nonsustained ventricular tachycardia on a prior exercise treadmill but his LV function is normal. We have treated him with beta blockers and his palpitations have been well-controlled. A previous Myoview in November 2005 showed an ejection fraction of 58% with no ischemia or infarction. A previous echocardiogram in November 2005 revealed normal LV function, trace mitral and tricuspid regurgitation. Patient states that for the past several months he has had increasing palpitations. They are described as skips and heart racing. There is associated dyspnea and mild chest tightness. They occur with activities and with stressful situations. They resolve spontaneously. There is no syncope. Otherwise no dyspnea on exertion, orthopnea, PND, pedal edema or exertional chest pain.  Current Outpatient Prescriptions  Medication Sig Dispense Refill  . aspirin (BAYER LOW STRENGTH) 81 MG EC tablet Take 81 mg by mouth daily.      Marland Kitchen losartan-hydrochlorothiazide (HYZAAR) 100-25 MG per tablet TAKE ONE TABLET BY MOUTH EVERY DAY 90 tablet 3  . metoprolol tartrate (LOPRESSOR) 25 MG tablet Take 25 mg by mouth daily.     . Multiple Vitamin (MULTIVITAMIN WITH MINERALS) TABS tablet Take 1 tablet by mouth daily.    . sildenafil (VIAGRA) 50 MG tablet Take 1 tablet (50 mg total) by mouth as needed for erectile dysfunction. 10 tablet 0   No current facility-administered medications for this visit.    No Known Allergies   Past Medical History  Diagnosis Date  . HBP (high blood pressure)     ACE d/c'd 11/02/08. cough/sorethroat > resolved  . Palpitations     neg myoview 11/05 with EF 58%  . Diverticulosis     see colonoscopy 5/06 rec fu 11/2014  . Health maintenance examination     [name]. Td June 30/2011. CPX June 30/2011    Past Surgical History  Procedure Laterality Date  . Back surgery      [name] 11/08     Social History   Social History  . Marital Status: Married    Spouse Name: N/A  . Number of Children: N/A  . Years of Education: N/A   Occupational History  . Salesman    Social History Main Topics  . Smoking status: Never Smoker   . Smokeless tobacco: Never Used  . Alcohol Use: 3.0 - 3.6 oz/week    5-6 Standard drinks or equivalent per week  . Drug Use: No  . Sexual Activity: Not on file   Other Topics Concern  . Not on file   Social History Narrative   Denies flu shot 09/29/2010          Family History  Problem Relation Age of Onset  . Heart disease Father 24  . Diabetes Brother     ROS: no fevers or chills, productive cough, hemoptysis, dysphasia, odynophagia, melena, hematochezia, dysuria, hematuria, rash, seizure activity, orthopnea, PND, pedal edema, claudication. Remaining systems are negative.  Physical Exam:   Blood pressure 130/80, pulse 53, height 6' (1.829 m), weight 195 lb 4.8 oz (88.587 kg).  General:  Well developed/well nourished in NAD Skin warm/dry Patient not depressed No peripheral clubbing Back-normal HEENT-normal/normal eyelids Neck supple/normal carotid upstroke bilaterally; no bruits; no JVD; no thyromegaly chest - CTA/ normal expansion CV - RRR/normal S1 and S2; no murmurs, rubs or gallops;  PMI nondisplaced Abdomen -NT/ND, no HSM, no mass, + bowel sounds, no bruit 2+ femoral pulses, no bruits Ext-no edema, chords,  2+ DP Neuro-grossly nonfocal  ECG sinus bradycardia at a rate of 53.

## 2015-03-20 NOTE — Assessment & Plan Note (Signed)
Blood pressure controlled. Continue present medications. 

## 2015-03-20 NOTE — Assessment & Plan Note (Signed)
Plan exercise treadmill for risk stratification and to exclude exercise-induced arrhythmias.

## 2015-03-20 NOTE — Assessment & Plan Note (Signed)
Patient does have a history of nonsustained ventricular tachycardia with treadmill. He also is describing some chest tightness with his palpitations. Plan echocardiogram to assess LV function and CardioNet to further assess. Increase metoprolol to 25 mg twice a day. I have asked him to decrease alcohol and caffeine intake to see if this improves his symptoms.

## 2015-03-20 NOTE — Patient Instructions (Signed)
Your physician recommends that you schedule a follow-up appointment in: [place] has requested that you have an echocardiogram. Echocardiography is a painless test that uses sound waves to create images of your heart. It provides your doctor with information about the size and shape of your heart and how well your heart's chambers and valves are working. This procedure takes approximately one hour. There are no restrictions for this procedure.   Your physician has recommended that you wear an event monitor. Event monitors are medical devices that record the heart's electrical activity. Doctors most often [place] these monitors to diagnose arrhythmias. Arrhythmias are problems with the speed or rhythm of the heartbeat. The monitor is a small, portable device. You can wear one while you do your normal daily activities. This is usually used to diagnose what is causing palpitations/syncope (passing out).   Your physician has requested that you have an exercise tolerance test. For further information please visit [email or web address]. Please also follow instruction sheet, as given.    Exercise Stress Electrocardiogram An exercise stress electrocardiogram is a test to check how blood flows to your heart. It is done to find areas of poor blood flow. You will need to walk on a treadmill for this test. The electrocardiogram will record your heartbeat when you are at rest and when you are exercising. BEFORE THE PROCEDURE  Do not have drinks with caffeine or foods with caffeine for 24 hours before the test, or as told by your doctor. This includes coffee, tea (even decaf tea), sodas, chocolate, and cocoa.  Follow your doctor's instructions about eating and drinking before the test.  Ask your doctor what medicines you should or should not take before the test. Take your medicines with water unless told by your doctor not to.  If you use an inhaler, bring it with you to the  test.  Bring a snack to eat after the test.  Do not  smoke for 4 hours before the test.  Do not put lotions, powders, creams, or oils on your chest before the test.  Wear comfortable shoes and clothing. PROCEDURE  You will have patches put on your chest. Small areas of your chest may need to be shaved. Wires will be connected to the patches.  Your heart rate will be watched while you are resting and while you are exercising.  You will walk on the treadmill. The treadmill will slowly get faster to raise your heart rate.  The test will take about 1-2 hours. AFTER THE PROCEDURE  Your heart rate and blood pressure will be watched after the test.  You may return to your normal diet, activities, and medicines or as told by your doctor. Document Released: 12/30/2007 Document Revised: 11/27/2013 Document Reviewed: 03/20/2013 [facility] Patient Information 2015 [place], [place]. This information is not intended to replace advice given to you by your health care provider. Make sure you discuss any questions you have with your health care provider.

## 2015-03-20 NOTE — Addendum Note (Signed)
Addended by: Cristopher Estimable on: 03/20/2015 03:24 PM   Modules accepted: Orders

## 2015-03-21 ENCOUNTER — Other Ambulatory Visit: Payer: Self-pay

## 2015-03-21 ENCOUNTER — Ambulatory Visit (HOSPITAL_COMMUNITY): Payer: Medicare Other | Attending: Cardiology

## 2015-03-21 DIAGNOSIS — I371 Nonrheumatic pulmonary valve insufficiency: Secondary | ICD-10-CM | POA: Insufficient documentation

## 2015-03-21 DIAGNOSIS — R079 Chest pain, unspecified: Secondary | ICD-10-CM | POA: Insufficient documentation

## 2015-03-21 DIAGNOSIS — I340 Nonrheumatic mitral (valve) insufficiency: Secondary | ICD-10-CM | POA: Diagnosis not present

## 2015-03-21 DIAGNOSIS — I5189 Other ill-defined heart diseases: Secondary | ICD-10-CM | POA: Insufficient documentation

## 2015-03-21 DIAGNOSIS — I10 Essential (primary) hypertension: Secondary | ICD-10-CM | POA: Insufficient documentation

## 2015-03-21 DIAGNOSIS — I071 Rheumatic tricuspid insufficiency: Secondary | ICD-10-CM | POA: Insufficient documentation

## 2015-03-22 ENCOUNTER — Ambulatory Visit (INDEPENDENT_AMBULATORY_CARE_PROVIDER_SITE_OTHER): Payer: Medicare Other

## 2015-03-22 ENCOUNTER — Ambulatory Visit: Payer: Medicare Other

## 2015-03-22 DIAGNOSIS — R002 Palpitations: Secondary | ICD-10-CM | POA: Diagnosis not present

## 2015-03-26 ENCOUNTER — Encounter: Payer: Self-pay | Admitting: Internal Medicine

## 2015-03-26 ENCOUNTER — Other Ambulatory Visit (INDEPENDENT_AMBULATORY_CARE_PROVIDER_SITE_OTHER): Payer: Medicare Other

## 2015-03-26 ENCOUNTER — Ambulatory Visit (INDEPENDENT_AMBULATORY_CARE_PROVIDER_SITE_OTHER): Payer: Medicare Other | Admitting: Internal Medicine

## 2015-03-26 VITALS — BP 128/72 | HR 56 | Ht 72.0 in | Wt 197.8 lb

## 2015-03-26 DIAGNOSIS — R002 Palpitations: Secondary | ICD-10-CM

## 2015-03-26 DIAGNOSIS — K5732 Diverticulitis of large intestine without perforation or abscess without bleeding: Secondary | ICD-10-CM | POA: Diagnosis not present

## 2015-03-26 DIAGNOSIS — K5730 Diverticulosis of large intestine without perforation or abscess without bleeding: Secondary | ICD-10-CM | POA: Diagnosis not present

## 2015-03-26 DIAGNOSIS — I10 Essential (primary) hypertension: Secondary | ICD-10-CM

## 2015-03-26 DIAGNOSIS — E785 Hyperlipidemia, unspecified: Secondary | ICD-10-CM | POA: Diagnosis not present

## 2015-03-26 DIAGNOSIS — Z0000 Encounter for general adult medical examination without abnormal findings: Secondary | ICD-10-CM

## 2015-03-26 DIAGNOSIS — N402 Nodular prostate without lower urinary tract symptoms: Secondary | ICD-10-CM

## 2015-03-26 LAB — URINALYSIS
Bilirubin Urine: NEGATIVE
HGB URINE DIPSTICK: NEGATIVE
KETONES UR: NEGATIVE
LEUKOCYTES UA: NEGATIVE
Nitrite: NEGATIVE
Total Protein, Urine: NEGATIVE
URINE GLUCOSE: NEGATIVE
UROBILINOGEN UA: 0.2 (ref 0.0–1.0)
pH: 6 (ref 5.0–8.0)

## 2015-03-26 LAB — BASIC METABOLIC PANEL
BUN: 17 mg/dL (ref 6–23)
CALCIUM: 9.1 mg/dL (ref 8.4–10.5)
CO2: 30 mEq/L (ref 19–32)
Chloride: 106 mEq/L (ref 96–112)
Creatinine, Ser: 0.96 mg/dL (ref 0.40–1.50)
GFR: 82.78 mL/min (ref >60.00)
GLUCOSE: 103 mg/dL — AB (ref 70–99)
POTASSIUM: 4.4 meq/L (ref 3.5–5.1)
SODIUM: 142 meq/L (ref 135–145)

## 2015-03-26 LAB — CBC WITH DIFFERENTIAL/PLATELET
BASOS ABS: 0 10*3/uL (ref 0.0–0.1)
Basophils Relative: 0.3 % (ref 0.0–3.0)
EOS ABS: 0.3 10*3/uL (ref 0.0–0.7)
Eosinophils Relative: 3.8 % (ref 0.0–5.0)
HCT: 44.3 % (ref 39.0–52.0)
Hemoglobin: 15 g/dL (ref 13.0–17.0)
LYMPHS ABS: 2.3 10*3/uL (ref 0.7–4.0)
Lymphocytes Relative: 34.2 % (ref 12.0–46.0)
MCHC: 33.8 g/dL (ref 30.0–36.0)
MCV: 96.1 fl (ref 78.0–100.0)
MONO ABS: 0.6 10*3/uL (ref 0.1–1.0)
Monocytes Relative: 8.5 % (ref 3.0–12.0)
NEUTROS ABS: 3.6 10*3/uL (ref 1.4–7.7)
NEUTROS PCT: 53.2 % (ref 43.0–77.0)
PLATELETS: 295 10*3/uL (ref 150.0–400.0)
RBC: 4.61 Mil/uL (ref 4.22–5.81)
RDW: 12.9 % (ref 11.5–15.5)
WBC: 6.8 10*3/uL (ref 4.0–10.5)

## 2015-03-26 LAB — LIPID PANEL
CHOL/HDL RATIO: 4
Cholesterol: 178 mg/dL (ref 0–200)
HDL: 48.4 mg/dL (ref >39.00)
LDL CALC: 102 mg/dL — AB (ref 0–99)
NONHDL: 130.05
TRIGLYCERIDES: 141 mg/dL (ref 0.0–149.0)
VLDL: 28.2 mg/dL (ref 0.0–40.0)

## 2015-03-26 LAB — TSH: TSH: 0.98 u[IU]/mL (ref 0.35–4.50)

## 2015-03-26 LAB — HEPATIC FUNCTION PANEL
ALK PHOS: 70 U/L (ref 39–117)
ALT: 18 U/L (ref 0–53)
AST: 18 U/L (ref 0–37)
Albumin: 3.9 g/dL (ref 3.5–5.2)
BILIRUBIN DIRECT: 0.1 mg/dL (ref 0.0–0.3)
BILIRUBIN TOTAL: 0.4 mg/dL (ref 0.2–1.2)
Total Protein: 6.9 g/dL (ref 6.0–8.3)

## 2015-03-26 NOTE — Progress Notes (Signed)
Quick Note:  Spoke with pt and notified of results per Dr. [name]. Pt verbalized understanding and denied any questions.  ______ 

## 2015-03-26 NOTE — Patient Instructions (Addendum)
Please remember to go to the  x-ray department downstairs for your tests - we will call you with the results when they are available.  Please see patient coordinator before you leave today  to schedule colonoscopy before the end of the year  I recommend you get the Prevnar13 this next year  Please schedule a follow up visit in 12 months but call sooner if needed with cpx then

## 2015-03-26 NOTE — Progress Notes (Signed)
Subjective:     Patient ID: Cory Vasquez, male   DOB: May 18, 1946 .   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     History of Present Illness  November 02, 2008 ov cough after doubled lisinopril assoc with intense sore throat withouth sign nasal symptoms, sob, excess mucus or purulent sputum. No itching / sneezing. rec d/c acei, resolved    11/08/2012 f/u ov/[name] new problem Chief Complaint  Patient presents with  . Follow-up    red bump under right arm that is tender to touch.   noted in shower one day prior to [place] doesn't know how long present, no pain unless touches it,  no fever, no gardening or shoulder pain. rec Doxycline 100 twice daily and warm slt salty compresses 4 x daily > resolved     03/22/2014 f/u ov/[name] re:  Hbp/ rhinitis  Chief Complaint  Patient presents with  . Annual Exam    Pt states doing well and denies any co's. Fasting today.   no palpitations, ex cp, tia, claudication, sob, cough  No longer exercising   03/26/2015 f/u ov/[name] re: hbp/ palpitations  Chief Complaint  Patient presents with  . Annual Exam    Pt is fasting. He states he has been having palpitaions and is currently on a holter monitor.     Brisk walk 25-30 min 3-4 times per week including up hills/ only one time palpitations with ex and some chest tightness on 25 mg once daily > 30 day holter and increase to bid but has not started yet.  No obvious day to day or daytime variability or assoc chronic cough or cp or chest tightness, subjective wheeze or overt sinus or hb symptoms. No unusual exp hx or h/o childhood pna/ asthma or knowledge of premature birth.  Sleeping ok without nocturnal  or early am exacerbation  of respiratory  c/o's or need for noct saba. Also denies any obvious fluctuation of symptoms with weather or environmental changes or other  aggravating or alleviating factors except as outlined above   Current Medications, Allergies, Complete Past Medical History, Past Surgical History, Family History, and Social History were reviewed in [employer] record.  ROS  The following are not active complaints unless bolded sore throat, dysphagia, dental problems, itching, sneezing,  nasal congestion or excess/ purulent secretions, ear ache,   fever, chills, sweats, unintended wt loss, classically pleuritic or exertional cp, hemoptysis,  orthopnea pnd or leg swelling, presyncope, palpitations, abdominal pain, anorexia, nausea, vomiting, diarrhea  or change in bowel or bladder habits, change in stools or urine, dysuria,hematuria,  rash, arthralgias, visual complaints, headache, numbness, weakness or ataxia or problems with walking or coordination,  change in mood/affect or memory.             Past Medical History:   HBP  - ACE d/c'd 11/02/08 cough/sorethroat > resolved  Palpitations  - neg myoview 05/2004 with EF 58%  Divertiuculosis  - see colonoscopy 11/2004 rec fu 11/2014  Health Maintenance...........................................Marland KitchenWert  - Td January 23, 2010  - CPX 03/26/2015   - Prevnar rec  03/22/2014 > declined again 03/26/2015    Past Surgical History:  Back surgery [name] 11/08    Family History:  Ht dz father in 79s, mother 59  DM and HBP brother  no cancer   Social History:  never smoker  occ ETOH  Medical  Research officer, [name] working                Objective:   Physical Exam  wt   201 11/01/08 > 185 Dec 10, 2009> 186 January 23, 2010 > 185 January 23, 2010 >191 September 29, 2010 > 192 03/02/2011 > 03/03/2012  193 > 09/26/2012 194 > 11/08/2012 197 >  196 01/24/2013 > 193  03/06/13 > 03/22/2014 201> 03/26/2015 198   Somber amb wm in nad    HEENT: nl dentition, turbinates, and only mild erythema of  orophanx with no exudates   Nl nasal mucosa Neck without JVD/Nodes/TM .    Lungs clear to A and P  bilaterally without cough on insp or exp maneuvers  RRR no s3 or murmur or increase in P2  Abd soft and benign with nl excursion in the supine position. No bruits or organomegaly fem pulses intact  Ext warm without calf tenderness, cyanosis clubbing or edema, nl pulses  Skin  No lesions no ax nodes / f/u Dr Wilhemina Bonito  Reduced Bilateral DP pulses but good PT's bilaterally  MS nl gait, no deformities, restrictions Neuro: alert, approp, no deficits GU per Dr Jeffie Pollock    CXR PA and Lateral:   03/23/2015 :     I personally reviewed images and agree with radiology impression as follows:   No acute cardiopulmonary abnormality seen.   Labs ordered/ reviewed:   Lab 03/26/15 0815  NA 142  K 4.4  CL 106  CO2 30  BUN 17  CREATININE 0.96  GLUCOSE 103*   Lab 03/26/15 0815  HGB 15.0  HCT 44.3  WBC 6.8  PLT 295.0     Lab Results  Component Value Date   TSH 0.98 03/26/2015              Assessment:

## 2015-03-27 DIAGNOSIS — Z0000 Encounter for general adult medical examination without abnormal findings: Secondary | ICD-10-CM | POA: Insufficient documentation

## 2015-03-27 NOTE — Assessment & Plan Note (Signed)
Adequate control on present rx, reviewed > no change in rx needed   

## 2015-03-27 NOTE — Assessment & Plan Note (Signed)
-   neg myoview 05/2004 with EF 58%    - 30 day holter started 03/25/15

## 2015-03-27 NOTE — Assessment & Plan Note (Signed)
-   Target LDL < 130 as male, hbp, pos fm hx  Lab Results  Component Value Date   CHOL 178 03/26/2015   HDL 48.40 03/26/2015   LDLCALC 102* 03/26/2015   TRIG 141.0 03/26/2015   CHOLHDL 4 03/26/2015    Adequate control on present rx,  reviewed > no change in rx needed

## 2015-03-27 NOTE — Assessment & Plan Note (Signed)
Referred to GI here (to establish) 03/26/2015 as due for f/u from outside colonoscopy done 2006

## 2015-03-27 NOTE — Assessment & Plan Note (Signed)
referred to urology 03/06/2013 > dr Jeffie Pollock

## 2015-03-27 NOTE — Assessment & Plan Note (Signed)
Declined prevnar 13 03/26/2015

## 2015-04-11 ENCOUNTER — Telehealth (HOSPITAL_COMMUNITY): Payer: Self-pay

## 2015-04-11 NOTE — Telephone Encounter (Signed)
Encounter complete. 

## 2015-04-15 ENCOUNTER — Telehealth: Payer: Self-pay | Admitting: Cardiology

## 2015-04-15 ENCOUNTER — Telehealth: Payer: Self-pay | Admitting: Internal Medicine

## 2015-04-15 NOTE — Telephone Encounter (Signed)
Pt reports having a stomach virus since Friday with headaches, fever (101), diarrhea, abdominal cramping and some blood in stool later on.  Fever is better now but still having abd pain and diarrhea.  Pt hasnt tried anything otc yet.  Advised on clear liquid diet with slow advancing.  Please advise on any other recs for pt.

## 2015-04-15 NOTE — Telephone Encounter (Signed)
Spoke with pt, aware of below results and recs.  Pt has not had any travel in the past 2 weeks.   Nothing further needed at this time.

## 2015-04-15 NOTE — Telephone Encounter (Signed)
Kaopectate is strongest I would take for diarrhea and if not better in 48 will need to see if we can get him in with one of our GI docs - make sure he's not been traveling in the 2 weeks prior

## 2015-04-15 NOTE — Telephone Encounter (Signed)
New Message  Pt requests a call back to determine if a

## 2015-04-16 ENCOUNTER — Inpatient Hospital Stay (HOSPITAL_COMMUNITY): Admission: RE | Admit: 2015-04-16 | Payer: Medicare Other | Source: Ambulatory Visit

## 2015-04-22 ENCOUNTER — Telehealth: Payer: Self-pay | Admitting: Internal Medicine

## 2015-04-22 NOTE — Telephone Encounter (Signed)
Pt is aware of MW's recommendations. Nothing further was needed. 

## 2015-04-22 NOTE — Telephone Encounter (Signed)
Spoke with pt. States that 2-3 weeks ago he had a stomach virus. Was told by our office to take [place]. Now he is having increased abdominal pain and feels constipated. When having a BM he is having to strain to go and does not produce that much feces. Would like MW's recommendations.  MW - please advise. Thanks.

## 2015-04-22 NOTE — Telephone Encounter (Signed)
Obviously he was supposed to stop the kayopectate when the diarrhea stopped,  Ok to use beptobismal now prn and if not better milk of magnesia or see GI but can't call in anything for this s ov and it's not really my area of expertise

## 2015-04-25 ENCOUNTER — Telehealth: Payer: Self-pay | Admitting: Internal Medicine

## 2015-04-25 ENCOUNTER — Emergency Department (HOSPITAL_COMMUNITY)
Admission: EM | Admit: 2015-04-25 | Discharge: 2015-04-25 | Disposition: A | Discharge status: Home / Self Care | Payer: Medicare Other | Attending: Emergency Medicine | Admitting: Emergency Medicine

## 2015-04-25 ENCOUNTER — Encounter (HOSPITAL_COMMUNITY): Payer: Self-pay

## 2015-04-25 ENCOUNTER — Emergency Department (HOSPITAL_COMMUNITY): Payer: Medicare Other

## 2015-04-25 DIAGNOSIS — K5289 Other specified noninfective gastroenteritis and colitis: Secondary | ICD-10-CM | POA: Insufficient documentation

## 2015-04-25 DIAGNOSIS — R509 Fever, unspecified: Secondary | ICD-10-CM | POA: Diagnosis not present

## 2015-04-25 DIAGNOSIS — Z8546 Personal history of malignant neoplasm of prostate: Secondary | ICD-10-CM | POA: Insufficient documentation

## 2015-04-25 DIAGNOSIS — Z7982 Long term (current) use of aspirin: Secondary | ICD-10-CM | POA: Diagnosis not present

## 2015-04-25 DIAGNOSIS — K529 Noninfective gastroenteritis and colitis, unspecified: Secondary | ICD-10-CM

## 2015-04-25 DIAGNOSIS — K5900 Constipation, unspecified: Secondary | ICD-10-CM | POA: Diagnosis not present

## 2015-04-25 DIAGNOSIS — R1032 Left lower quadrant pain: Secondary | ICD-10-CM | POA: Diagnosis present

## 2015-04-25 DIAGNOSIS — Z79899 Other long term (current) drug therapy: Secondary | ICD-10-CM | POA: Diagnosis not present

## 2015-04-25 LAB — COMPREHENSIVE METABOLIC PANEL
ALT: 24 U/L (ref 17–63)
AST: 23 U/L (ref 15–41)
Albumin: 3.5 g/dL (ref 3.5–5.0)
Alkaline Phosphatase: 95 U/L (ref 38–126)
Anion gap: 9 (ref 5–15)
BUN: 19 mg/dL (ref 6–20)
CHLORIDE: 105 mmol/L (ref 101–111)
CO2: 26 mmol/L (ref 22–32)
CREATININE: 0.86 mg/dL (ref 0.61–1.24)
Calcium: 9.1 mg/dL (ref 8.9–10.3)
Glucose, Bld: 109 mg/dL — ABNORMAL HIGH (ref 65–99)
POTASSIUM: 3.6 mmol/L (ref 3.5–5.1)
SODIUM: 140 mmol/L (ref 135–145)
Total Bilirubin: 0.4 mg/dL (ref 0.3–1.2)
Total Protein: 7.3 g/dL (ref 6.5–8.1)

## 2015-04-25 LAB — URINALYSIS, ROUTINE W REFLEX MICROSCOPIC
Bilirubin Urine: NEGATIVE
Glucose, UA: NEGATIVE mg/dL
Hgb urine dipstick: NEGATIVE
KETONES UR: NEGATIVE mg/dL
LEUKOCYTES UA: NEGATIVE
NITRITE: NEGATIVE
PROTEIN: NEGATIVE mg/dL
Specific Gravity, Urine: 1.017 (ref 1.005–1.030)
Urobilinogen, UA: 1 mg/dL (ref 0.0–1.0)
pH: 7 (ref 5.0–8.0)

## 2015-04-25 LAB — CBC WITH DIFFERENTIAL/PLATELET
BASOS ABS: 0 10*3/uL (ref 0.0–0.1)
Basophils Relative: 0 %
EOS ABS: 0.1 10*3/uL (ref 0.0–0.7)
EOS PCT: 1 %
HCT: 40.8 % (ref 39.0–52.0)
Hemoglobin: 13.8 g/dL (ref 13.0–17.0)
Lymphocytes Relative: 13 %
Lymphs Abs: 1.7 10*3/uL (ref 0.7–4.0)
MCH: 32.1 pg (ref 26.0–34.0)
MCHC: 33.8 g/dL (ref 30.0–36.0)
MCV: 94.9 fL (ref 78.0–100.0)
MONO ABS: 0.8 10*3/uL (ref 0.1–1.0)
Monocytes Relative: 6 %
Neutro Abs: 10.2 10*3/uL — ABNORMAL HIGH (ref 1.7–7.7)
Neutrophils Relative %: 80 %
PLATELETS: 445 10*3/uL — AB (ref 150–400)
RBC: 4.3 MIL/uL (ref 4.22–5.81)
RDW: 12.4 % (ref 11.5–15.5)
WBC: 12.7 10*3/uL — AB (ref 4.0–10.5)

## 2015-04-25 LAB — LIPASE, BLOOD: LIPASE: 22 U/L (ref 22–51)

## 2015-04-25 MED ORDER — IOHEXOL 300 MG/ML  SOLN
100.0000 mL | Freq: Once | INTRAMUSCULAR | Status: AC | PRN
  Administered 2015-04-25: 100 mL via INTRAVENOUS

## 2015-04-25 MED ORDER — SODIUM CHLORIDE 0.9 % IV BOLUS (SEPSIS)
1000.0000 mL | Freq: Once | INTRAVENOUS | Status: AC
  Administered 2015-04-25: 1000 mL via INTRAVENOUS

## 2015-04-25 MED ORDER — SENNA 8.6 MG PO TABS: 2.0000 | ORAL_TABLET | Freq: Every day | ORAL | Status: DC | PRN

## 2015-04-25 MED ORDER — MAGNESIUM CITRATE PO SOLN: 296.0000 mL | Freq: Once | ORAL | Status: DC

## 2015-04-25 MED ORDER — ONDANSETRON 4 MG PO TBDP: 4.0000 mg | ORAL_TABLET | Freq: Three times a day (TID) | ORAL | Status: DC | PRN

## 2015-04-25 MED ORDER — POLYETHYLENE GLYCOL 3350 17 GM/SCOOP PO POWD: 1.0000 | Freq: Once | ORAL | Status: DC

## 2015-04-25 MED ORDER — IOHEXOL 300 MG/ML  SOLN
50.0000 mL | Freq: Once | INTRAMUSCULAR | Status: DC | PRN
  Administered 2015-04-25: 50 mL via ORAL
  Filled 2015-04-25: qty 50

## 2015-04-25 NOTE — ED Notes (Addendum)
Pt c/o intermittent abdominal pain and constipation x 1 week.  Currently, denies pain.  Denies n/v.  Pt reports that prior to constipation starting, he was taking kaopectate due to diarrhea from a "stomach bug." Sts he has taken milk of magnesia and Dulcolax w/o relief.  Hx of diverticulitis.

## 2015-04-25 NOTE — Telephone Encounter (Signed)
Called pt and informed of CY rec of going to ER for evaluation Pt voiced understanding of instructions  Nothing further is needed at this time

## 2015-04-25 NOTE — Telephone Encounter (Signed)
Called and spoke with patient  Pt c/o severe lower abdominal pain, low grade fever of 99.5, and no bowel movement x 7 days Pt states having GI virus last week and called office and was informed to take anti-diarrheal to help with loose stools  Since taking medication he has not been able to have bowel movement Pt states taking milk of mag and ducolax without relief Pt does not have GI dr and when he called and tried to arrange appt he was told that the next available would be 05-2015 Pt wants to see if we can put in urgent GI referral or if he needs to go to ER for evaluation  No Known Allergies   Current Outpatient Prescriptions on File Prior to Visit  Medication Sig Dispense Refill  . aspirin (BAYER LOW STRENGTH) 81 MG EC tablet Take 81 mg by mouth daily.      Marland Kitchen losartan-hydrochlorothiazide (HYZAAR) 100-25 MG per tablet TAKE ONE TABLET BY MOUTH EVERY DAY 90 tablet 3  . metoprolol tartrate (LOPRESSOR) 25 MG tablet Take 1 tablet (25 mg total) by mouth 2 (two) times daily.    . Multiple Vitamin (MULTIVITAMIN WITH MINERALS) TABS tablet Take 1 tablet by mouth daily.    . sildenafil (VIAGRA) 50 MG tablet Take 1 tablet (50 mg total) by mouth as needed for erectile dysfunction. 10 tablet 0   No current facility-administered medications on file prior to visit.    Will route to doc of the day, since Dr Melvyn Novas is not available  Dr Annamaria Boots, please advise. Thanks

## 2015-04-25 NOTE — ED Provider Notes (Signed)
CSN: [id number]     Arrival date & time 04/25/15  0946 History   First MD Initiated Contact with Patient 04/25/15 [phone]     Chief Complaint  Patient presents with  . Abdominal Pain  . Constipation     (Consider location/radiation/quality/duration/timing/severity/associated sxs/prior Treatment) HPI Comments: Initially diarrhea, abdominal pain, thought it was stomach bug, received med from Dr. Melvyn Novas Continued to have pain, stopped going 1wk ago, tried milk of magnesia, dulcolax, made pain worse, no BM in 1 week.  Yesterday temp 99 (100.9 initially with diarrhea 2 weeks ago)   Patient is a 69 y.o. male presenting with abdominal pain and constipation.  Abdominal Pain Pain location:  LLQ and RLQ Pain quality: cramping and sharp   Pain radiates to:  Does not radiate Pain severity:  Severe Onset quality:  Gradual Duration:  1 week Progression:  Waxing and waning Chronicity:  New Worsened by:  Nothing tried Associated symptoms: constipation and fever (last week, now improved)   Associated symptoms: no chest pain, no nausea, no shortness of breath, no sore throat and no vomiting  Diarrhea: had diarrhea 2 wk ago, resolved.   Constipation Associated symptoms: abdominal pain and fever (last week, now improved)   Associated symptoms: no back pain, no nausea and no vomiting  Diarrhea: had diarrhea 2 wk ago, resolved.     Past Medical History  Diagnosis Date  . HBP (high blood pressure)     ACE d/c'd 11/02/08. cough/sorethroat > resolved  . Palpitations     neg myoview 11/05 with EF 58%  . Diverticulosis     see colonoscopy 5/06 rec fu 11/2014  . Health maintenance examination     [name]. Td June 30/2011. CPX June 30/2011  . Prostate cancer    Past Surgical History  Procedure Laterality Date  . Back surgery      [name] 11/08   Family History  Problem Relation Age of Onset  . Heart disease Father 36  . Diabetes Brother    Social History  Substance Use Topics  . Smoking status:  Never Smoker   . Smokeless tobacco: Never Used  . Alcohol Use: 3.0 - 3.6 oz/week    5-6 Standard drinks or equivalent per week     Comment: Occasional    Review of Systems  Constitutional: Positive for fever (last week, now improved).  HENT: Negative for sore throat.   Eyes: Negative for visual disturbance.  Respiratory: Negative for shortness of breath.   Cardiovascular: Negative for chest pain.  Gastrointestinal: Positive for abdominal pain and constipation. Negative for nausea and vomiting. Diarrhea: had diarrhea 2 wk ago, resolved.  Genitourinary: Negative for difficulty urinating.  Musculoskeletal: Negative for back pain and neck stiffness.  Skin: Negative for rash.  Neurological: Negative for syncope and headaches.      Allergies  Review of patient's allergies indicates no known allergies.  Home Medications   Prior to Admission medications   Medication Sig Start Date End Date Taking? Authorizing Iyauna Sing  aspirin (BAYER LOW STRENGTH) 81 MG EC tablet Take 81 mg by mouth daily.     Yes Historical Ren Aspinall, MD  losartan-hydrochlorothiazide (HYZAAR) 100-25 MG per tablet TAKE ONE TABLET BY MOUTH EVERY DAY 05/01/14  Yes Tanda Rockers, MD  metoprolol tartrate (LOPRESSOR) 25 MG tablet Take 1 tablet (25 mg total) by mouth 2 (two) times daily. 03/20/15  Yes Lelon Perla, MD  Multiple Vitamin (MULTIVITAMIN WITH MINERALS) TABS tablet Take 1 tablet by mouth daily.   Yes Historical  Eda Magnussen, MD  sildenafil (VIAGRA) 50 MG tablet Take 1 tablet (50 mg total) by mouth as needed for erectile dysfunction. 05/17/14  Yes Tanda Rockers, MD  magnesium citrate solution Take 296 mLs by mouth once. 04/25/15   Gareth Morgan, MD  ondansetron (ZOFRAN ODT) 4 MG disintegrating tablet Take 1 tablet (4 mg total) by mouth every 8 (eight) hours as needed for nausea or vomiting. 04/25/15   Gareth Morgan, MD  polyethylene glycol powder (MIRALAX) powder Take 255 g by mouth once. 04/25/15   Gareth Morgan,  MD  senna (SENOKOT) 8.6 MG TABS tablet Take 2 tablets (17.2 mg total) by mouth daily as needed for mild constipation. 04/25/15   Gareth Morgan, MD   BP 163/90 mmHg  Pulse 66  Temp(Src) 98.1 F (36.7 C) (Oral)  Resp 20  SpO2 100% Physical Exam  Constitutional: He is oriented to person, place, and time. He appears well-developed and well-nourished. No distress.  HENT:  Head: Normocephalic and atraumatic.  Eyes: Conjunctivae and EOM are normal.  Neck: Normal range of motion.  Cardiovascular: Normal rate, regular rhythm, normal heart sounds and intact distal pulses.  Exam reveals no gallop and no friction rub.   No murmur heard. Pulmonary/Chest: Effort normal and breath sounds normal. No respiratory distress. He has no wheezes. He has no rales.  Abdominal: Soft. He exhibits no distension. There is tenderness (LLQ, suprapubic). There is no guarding.  Musculoskeletal: He exhibits no edema.  Neurological: He is alert and oriented to person, place, and time.  Skin: Skin is warm and dry. He is not diaphoretic.  Nursing note and vitals reviewed.   ED Course  Procedures (including critical care time) Labs Review Labs Reviewed  CBC WITH DIFFERENTIAL/PLATELET - Abnormal; Notable for the following:    WBC 12.7 (*)    Platelets 445 (*)    Neutro Abs 10.2 (*)    All other components within normal limits  COMPREHENSIVE METABOLIC PANEL - Abnormal; Notable for the following:    Glucose, Bld 109 (*)    All other components within normal limits  LIPASE, BLOOD  URINALYSIS, ROUTINE W REFLEX MICROSCOPIC (NOT AT [facility])    Imaging Review Ct Abdomen Pelvis W Contrast  04/25/2015   CLINICAL DATA:  Lower abdominal pain. Diverticulitis. Intermittent abdominal pain and constipation for 1 week.  EXAM: CT ABDOMEN AND PELVIS WITH CONTRAST  TECHNIQUE: Multidetector CT imaging of the abdomen and pelvis was performed using the standard protocol following bolus administration of intravenous contrast.  CONTRAST:   120mL OMNIPAQUE IOHEXOL 300 MG/ML  SOLN  COMPARISON:  None.  FINDINGS: Musculoskeletal: No aggressive osseous lesions. Lumbar spondylosis.  Lung Bases: Dependent atelectasis.  Scarring in the lingula.  Liver:  Normal.  Spleen:  Normal.  Gallbladder:  Normal.  Common bile duct:  Normal.  Pancreas:  Normal.  Adrenal glands:  Normal bilaterally.  Kidneys: Normal renal enhancement and excretion. Subcentimeter low-density lesion the LEFT upper renal pole likely represents a simple renal cyst. LEFT ureter appears normal. RIGHT ureter normal.  Stomach:  Normal.  Small bowel:  No inflammatory changes or obstruction of small bowel.  Colon: There appears to be in normal appendix inferior to the cecum. Colon is diffusely distended with stool. The stool burden becomes progressively larger in the transverse and descending colon, worst in the rectosigmoid. There is some stranding around the stool distended transversely oriented sigmoid colon however there is no focal diverticulitis, perforation or abscess.  The rectosigmoid junction is collapsed and a small amount of  fluid is present within the rectum. Mural thickening at the rectosigmoid junction is probably due to collapse rather than colonic mass. If screening colonoscopy has not been recently performed, it is recommended.  Pelvic Genitourinary:  Normal.  Peritoneum: Small amount of free fluid is present in both lower quadrants adjacent to the distended sigmoid colon. This probably represents reactive free fluid. No upper abdominal free fluid.  Vascular/lymphatic: Atherosclerosis.  Body Wall: Normal.  IMPRESSION: 1. Large stool burden, particularly in the sigmoid colon. 2. Small amount of free fluid and subtle inflammatory changes in the sigmoid mesocolon. Given the stool burden, the findings suggest stercoral colitis. There is no focal inflammatory change to suggest diverticulitis.   Electronically Signed   By: Dereck Ligas M.D.   On: 04/25/2015 12:40   I have  personally reviewed and evaluated these images and lab results as part of my medical decision-making.   EKG Interpretation None      MDM   Final diagnoses:  Constipation, unspecified constipation type  Colitis, stercoral   69 year old male with a history of hyperlipidemia, hypertension, diverticulosis on colonoscopy, prostate cancer under observation, presents with concern of abdominal pain for 1 week.  Differential diagnosis includes urinary tract infection, diverticulitis, constipation, SBO.  CT abdomen and pelvis obtained that shows large stool burden with signs of stercoral colitis.  Discussed case with general surgery given concern for progression of colitis with possible ulceration or perforation developing. General surgery Jimmye Norman) recommended methods of decreasing constipation, and repot that using enema is not contraindicated.  Stool not in position for disimpaction. Patient to start miralax, sennakot, and or possible magnesium citrate. Discussed return precautions with patient in detail including returning to the ED if he develops sudden worsening of pain.  Patient discharged in stable condition with understanding of reasons to return.   Gareth Morgan, MD 04/25/15 [phone]

## 2015-04-25 NOTE — Discharge Instructions (Signed)
Take 4 caps of miralax in one 32oz gatorade bottle, the next day take 2 caps of miralax, followed by 1cap of miralax or until your stool is soft consistency.  If the miralax and senna do not work, you may try magnesium citrate.

## 2015-04-25 NOTE — Telephone Encounter (Signed)
Best go to ER. Sorry he is uncomfortable.

## 2015-05-07 ENCOUNTER — Telehealth (HOSPITAL_COMMUNITY): Payer: Self-pay

## 2015-05-07 NOTE — Telephone Encounter (Signed)
Encounter complete. 

## 2015-05-09 ENCOUNTER — Ambulatory Visit (HOSPITAL_COMMUNITY)
Admission: RE | Admit: 2015-05-09 | Discharge: 2015-05-09 | Disposition: A | Discharge status: Home / Self Care | Payer: Medicare Other | Source: Ambulatory Visit | Attending: Cardiovascular Disease | Admitting: Cardiovascular Disease

## 2015-05-09 DIAGNOSIS — R079 Chest pain, unspecified: Secondary | ICD-10-CM

## 2015-05-09 LAB — EXERCISE TOLERANCE TEST
CHL CUP MPHR: 152 {beats}/min
CHL CUP STRESS STAGE 1 GRADE: 0 %
CHL CUP STRESS STAGE 1 HR: 80 {beats}/min
CHL CUP STRESS STAGE 1 SPEED: 0 mph
CHL CUP STRESS STAGE 2 GRADE: 0 %
CHL CUP STRESS STAGE 2 HR: 80 {beats}/min
CHL CUP STRESS STAGE 2 SPEED: 1 mph
CHL CUP STRESS STAGE 3 HR: 80 {beats}/min
CHL CUP STRESS STAGE 4 GRADE: 10 %
CHL CUP STRESS STAGE 4 SPEED: 1.7 mph
CHL CUP STRESS STAGE 5 HR: 107 {beats}/min
CHL CUP STRESS STAGE 5 SBP: 106 mmHg
CHL CUP STRESS STAGE 5 SPEED: 2.5 mph
CHL CUP STRESS STAGE 6 DBP: 77 mmHg
CHL CUP STRESS STAGE 6 GRADE: 14 %
CHL CUP STRESS STAGE 6 SBP: 123 mmHg
CHL CUP STRESS STAGE 6 SPEED: 3.4 mph
CHL CUP STRESS STAGE 7 HR: 107 {beats}/min
CHL CUP STRESS STAGE 7 SBP: 114 mmHg
CHL CUP STRESS STAGE 8 HR: 70 {beats}/min
CHL CUP STRESS STAGE 8 SPEED: 0 mph
CSEPEW: 10.1 METS
CSEPPHR: 118 {beats}/min
CSEPPMHR: 77 %
Exercise duration (min): 7 min
Exercise duration (sec): 29 s
Peak BP: 123 mmHg
Percent HR: 91 %
RPE: 16
Rest HR: 81 {beats}/min
Stage 1 DBP: 76 mmHg
Stage 1 SBP: 99 mmHg
Stage 3 Grade: 0 %
Stage 3 Speed: 1 mph
Stage 4 DBP: 51 mmHg
Stage 4 HR: 96 {beats}/min
Stage 4 SBP: 99 mmHg
Stage 5 DBP: 52 mmHg
Stage 5 Grade: 12 %
Stage 6 HR: 118 {beats}/min
Stage 7 DBP: 75 mmHg
Stage 7 Grade: 0 %
Stage 7 Speed: 0 mph
Stage 8 DBP: 79 mmHg
Stage 8 Grade: 0 %
Stage 8 SBP: 151 mmHg

## 2015-05-14 ENCOUNTER — Other Ambulatory Visit: Payer: Self-pay | Admitting: Internal Medicine

## 2015-05-14 MED ORDER — LOSARTAN POTASSIUM-HCTZ 100-25 MG PO TABS: 1.0000 | ORAL_TABLET | Freq: Every day | ORAL | Status: DC

## 2015-05-19 NOTE — Progress Notes (Signed)
      HPI: FU palpitations. He did have nonsustained ventricular tachycardia on a prior exercise treadmill but his LV function is normal. We have treated him with beta blockers and his palpitations have been reasonably controlled. A previous Myoview in November 2005 showed an ejection fraction of 58% with no ischemia or infarction. Seen recently for worsening palpitations. Echo 8/16 showed normal LV function, grade 1 diastolic dysfunction. Event monitor 8/16 negative. TSH 8/16 normal. ETT 10/16 negative. Since last seen, the patient denies any dyspnea on exertion, orthopnea, PND, pedal edema, palpitations, syncope or chest pain.   Current Outpatient Prescriptions  Medication Sig Dispense Refill  . aspirin (BAYER LOW STRENGTH) 81 MG EC tablet Take 81 mg by mouth daily.      Marland Kitchen losartan-hydrochlorothiazide (HYZAAR) 100-25 MG tablet Take 1 tablet by mouth daily. 90 tablet 3  . metoprolol tartrate (LOPRESSOR) 25 MG tablet Take 1 tablet (25 mg total) by mouth 2 (two) times daily.    . Multiple Vitamin (MULTIVITAMIN WITH MINERALS) TABS tablet Take 1 tablet by mouth daily.     No current facility-administered medications for this visit.     Past Medical History  Diagnosis Date  . HBP (high blood pressure)     ACE d/c'd 11/02/08. cough/sorethroat > resolved  . Palpitations     neg myoview 11/05 with EF 58%  . Diverticulosis     see colonoscopy 5/06 rec fu 11/2014  . Health maintenance examination     [name]. Td June 30/2011. CPX June 30/2011  . Prostate cancer [facility])     Past Surgical History  Procedure Laterality Date  . Back surgery      [name] 11/08    Social History   Social History  . Marital Status: Married    Spouse Name: N/A  . Number of Children: 2  . Years of Education: N/A   Occupational History  . Salesman    Social History Main Topics  . Smoking status: Never Smoker   . Smokeless tobacco: Never Used  . Alcohol Use: 3.0 - 3.6 oz/week    5-6 Standard drinks or  equivalent per week     Comment: Occasional  . Drug Use: No  . Sexual Activity: Not on file   Other Topics Concern  . Not on file   Social History Narrative   Denies flu shot 09/29/2010          ROS: no fevers or chills, productive cough, hemoptysis, dysphasia, odynophagia, melena, hematochezia, dysuria, hematuria, rash, seizure activity, orthopnea, PND, pedal edema, claudication. Remaining systems are negative.  Physical Exam: Well-developed well-nourished in no acute distress.  Skin is warm and dry.  HEENT is normal.  Neck is supple.  Chest is clear to auscultation with normal expansion.  Cardiovascular exam is regular rate and rhythm.  Abdominal exam nontender or distended. No masses palpated. Extremities show no edema. neuro grossly intact

## 2015-05-21 ENCOUNTER — Encounter: Payer: Self-pay | Admitting: Cardiology

## 2015-05-21 ENCOUNTER — Ambulatory Visit (INDEPENDENT_AMBULATORY_CARE_PROVIDER_SITE_OTHER): Payer: Medicare Other | Admitting: Cardiology

## 2015-05-21 VITALS — BP 112/70 | HR 60 | Ht 72.0 in | Wt 193.0 lb

## 2015-05-21 DIAGNOSIS — E785 Hyperlipidemia, unspecified: Secondary | ICD-10-CM | POA: Diagnosis not present

## 2015-05-21 DIAGNOSIS — R072 Precordial pain: Secondary | ICD-10-CM

## 2015-05-21 DIAGNOSIS — R002 Palpitations: Secondary | ICD-10-CM

## 2015-05-21 DIAGNOSIS — I10 Essential (primary) hypertension: Secondary | ICD-10-CM

## 2015-05-21 NOTE — Assessment & Plan Note (Signed)
Management per primary care. 

## 2015-05-21 NOTE — Assessment & Plan Note (Signed)
Blood pressure controlled. Continue present medications. 

## 2015-05-21 NOTE — Patient Instructions (Signed)
Your physician wants you to follow-up in: ONE YEAR WITH DR [name] You will receive a reminder letter in the mail two months in advance. If you don't receive a letter, please call our office to schedule the follow-up appointment.   If you need a refill on your cardiac medications before your next appointment, please call your pharmacy.  

## 2015-05-21 NOTE — Assessment & Plan Note (Signed)
Symptoms have improved. Continue beta blocker. We can advance in the future if needed.

## 2015-05-21 NOTE — Assessment & Plan Note (Signed)
No further symptoms. Exercise treadmill negative. LV function normal. No plans for further evaluation at this point.

## 2015-09-02 ENCOUNTER — Encounter: Payer: Self-pay | Admitting: Adult Health

## 2015-09-02 ENCOUNTER — Ambulatory Visit (INDEPENDENT_AMBULATORY_CARE_PROVIDER_SITE_OTHER): Payer: Medicare Other | Admitting: Adult Health

## 2015-09-02 VITALS — BP 118/80 | HR 65 | Temp 98.0°F | Ht 72.0 in | Wt 194.0 lb

## 2015-09-02 DIAGNOSIS — J069 Acute upper respiratory infection, unspecified: Secondary | ICD-10-CM | POA: Diagnosis not present

## 2015-09-02 MED ORDER — AZITHROMYCIN 250 MG PO TABS: ORAL_TABLET | ORAL | Status: AC

## 2015-09-02 NOTE — Progress Notes (Signed)
Chart and office note reviewed in detail  > agree with a/p as outlined    

## 2015-09-02 NOTE — Patient Instructions (Signed)
Zpack take as directed.  Mucinex DM Twice daily  As needed  Cough/congestion .  Zyrtec 10mg  At bedtime   Fluids and rest  Follow up Dr. Melvyn Novas  As planned and As needed   Please contact office for sooner follow up if symptoms do not improve or worsen or seek emergency care

## 2015-09-02 NOTE — Progress Notes (Signed)
Subjective:     Patient ID: Cory Vasquez, male   DOB: 11/18/1945 .   MRN: [id number]  Brief patient profile:  48 yowm never smoker with history of palpitations and hypertension and nonsustained ventricular tachycardia responsive to beta blockers.     History of Present Illness  November 02, 2008 ov cough after doubled lisinopril assoc with intense sore throat withouth sign nasal symptoms, sob, excess mucus or purulent sputum. No itching / sneezing. rec d/c acei, resolved    11/08/2012 f/u ov/[name] new problem Chief Complaint  Patient presents with  . Follow-up    red bump under right arm that is tender to touch.   noted in shower one day prior to [place] doesn't know how long present, no pain unless touches it,  no fever, no gardening or shoulder pain. rec Doxycline 100 twice daily and warm slt salty compresses 4 x daily > resolved     03/22/2014 f/u ov/[name] re:  Hbp/ rhinitis  Chief Complaint  Patient presents with  . Annual Exam    Pt states doing well and denies any co's. Fasting today.   no palpitations, ex cp, tia, claudication, sob, cough  No longer exercising   03/26/2015 f/u ov/[name] re: hbp/ palpitations  Chief Complaint  Patient presents with  . Annual Exam    Pt is fasting. He states he has been having palpitaions and is currently on a holter monitor.     Brisk walk 25-30 min 3-4 times per week including up hills/ only one time palpitations with ex and some chest tightness on 25 mg once daily > 30 day holter and increase to bid but has not started yet. >>no changes   09/02/2015 Acute OV  Pt presents for an acute office visit.  Complains of a bad cold that will not go away.  Complains of 10 days of chest congestion, sore throat, prod cough with clear mucus, sinus pressure/drainage. Has used several otc cold meds with limited benefit.   Appeite is good with no nv/d. No chest pain, orthopnea, hemoptysis or fever.   Says he used abx for 3 days recently for prostate biopsy.    Current Medications, Allergies, Complete Past Medical History, Past Surgical History, Family History, and Social History were reviewed in [employer] record.  ROS   Constitutional:   No  weight loss, night sweats,  Fevers, chills, fatigue, or  lassitude.  HEENT:   No headaches,  Difficulty swallowing,  Tooth/dental problems, or  Sore throat,                No sneezing, itching, ear ache,  +nasal congestion, post nasal drip,   CV:  No chest pain,  Orthopnea, PND, swelling in lower extremities, anasarca, dizziness, palpitations, syncope.   GI  No heartburn, indigestion, abdominal pain, nausea, vomiting, diarrhea, change in bowel habits, loss of appetite, bloody stools.   Resp:    No chest wall deformity  Skin: no rash or lesions.  GU: no dysuria, change in color of urine, no urgency or frequency.  No flank pain, no hematuria   MS:  No joint pain or swelling.  No decreased range of motion.  No back pain.  Psych:  No change in mood or affect. No depression or anxiety.  No memory loss.       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                Objective:   Physical Exam  wt   201 11/01/08 > 185 Dec 10, 2009> 186 January 23, 2010 > 185 January 23, 2010 >191 September 29, 2010 > 192 03/02/2011 > 03/03/2012  193 > 09/26/2012 194 > 11/08/2012 197 >  196 01/24/2013 > 193  03/06/13 > 03/22/2014 201> 03/26/2015 198 >194  Filed Vitals:   09/02/15  1600  BP: 118/80  Pulse: 65  Temp: 98 F (36.7 C)  TempSrc: Oral  Height: 6' (1.829 m)  Weight: 194 lb (87.998 kg)  SpO2: 98%     Somber amb wm in nad    HEENT: nl dentition, turbinates, and only mild erythema of  orophanx with no exudates   Nl nasal mucosa Neck without JVD/Nodes/TM .    Lungs clear to A and P bilaterally without cough on insp or exp maneuvers  RRR no s3 or murmur or increase in P2  Abd soft and benign with nl excursion in the supine position. No bruits or organomegaly fem pulses intact  Ext warm without calf tenderness, cyanosis clubbing or edema, nl pulses  Skin no rash noted    CXR PA and Lateral:   03/22/2014 :      No acute cardiopulmonary abnormality seen.   Labs ordered/ reviewed:   Lab 03/26/15 0815  NA 142  K 4.4  CL 106  CO2 30  BUN 17  CREATININE 0.96  GLUCOSE 103*   Lab 03/26/15 0815  HGB 15.0  HCT 44.3  WBC 6.8  PLT 295.0     Lab Results  Component Value Date   TSH 0.98 03/26/2015              Assessment:

## 2015-09-02 NOTE — Assessment & Plan Note (Signed)
URI unresponsive to OTC meds? Early bronchitis   Plan  Zpack take as directed.  Mucinex DM Twice daily  As needed  Cough/congestion .  Zyrtec 10mg  At bedtime   Fluids and rest  Follow up Dr. Melvyn Novas  As planned and As needed   Please contact office for sooner follow up if symptoms do not improve or worsen or seek emergency care

## 2015-09-06 ENCOUNTER — Ambulatory Visit: Payer: Medicare Other

## 2015-10-17 ENCOUNTER — Other Ambulatory Visit: Payer: Self-pay | Admitting: Internal Medicine

## 2015-11-26 ENCOUNTER — Encounter: Payer: Self-pay | Admitting: Adult Health

## 2015-11-26 ENCOUNTER — Ambulatory Visit (INDEPENDENT_AMBULATORY_CARE_PROVIDER_SITE_OTHER): Payer: Medicare Other | Admitting: Adult Health

## 2015-11-26 VITALS — BP 118/76 | HR 64 | Temp 98.1°F | Ht 72.0 in | Wt 193.0 lb

## 2015-11-26 DIAGNOSIS — H9202 Otalgia, left ear: Secondary | ICD-10-CM | POA: Diagnosis not present

## 2015-11-26 DIAGNOSIS — H6122 Impacted cerumen, left ear: Secondary | ICD-10-CM | POA: Diagnosis not present

## 2015-11-26 DIAGNOSIS — H9209 Otalgia, unspecified ear: Secondary | ICD-10-CM | POA: Insufficient documentation

## 2015-11-26 NOTE — Progress Notes (Signed)
Subjective:     Patient ID: Cory Vasquez, male   DOB: 1946-06-08 .   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     History of Present Illness  November 02, 2008 ov cough after doubled lisinopril assoc with intense sore throat withouth sign nasal symptoms, sob, excess mucus or purulent sputum. No itching / sneezing. rec d/c acei, resolved    11/08/2012 f/u ov/[name] new problem Chief Complaint  Patient presents with  . Follow-up    red bump under right arm that is tender to touch.   noted in shower one day prior to [place] doesn't know how long present, no pain unless touches it,  no fever, no gardening or shoulder pain. rec Doxycline 100 twice daily and warm slt salty compresses 4 x daily > resolved     03/22/2014 f/u ov/[name] re:  Hbp/ rhinitis  Chief Complaint  Patient presents with  . Annual Exam    Pt states doing well and denies any co's. Fasting today.   no palpitations, ex cp, tia, claudication, sob, cough  No longer exercising   03/26/2015 f/u ov/[name] re: hbp/ palpitations  Chief Complaint  Patient presents with  . Annual Exam    Pt is fasting. He states he has been having palpitaions and is currently on a holter monitor.     Brisk walk 25-30 min 3-4 times per week including up hills/ only one time palpitations with ex and some chest tightness on 25 mg once daily > 30 day holter and increase to bid but has not started yet. >>no changes   09/02/2015 Acute OV  Pt presents for an acute office visit.  Complains of a bad cold that will not go away.  Complains of 10 days of chest congestion, sore throat, prod cough with clear mucus, sinus pressure/drainage. Has used several otc cold meds with limited benefit.   Appeite is good with no nv/d. No chest pain, orthopnea, hemoptysis or fever.  Says he used abx for 3 days recently for prostate biopsy.   >>zpack rx   11/26/2015 Acute OV  Pt presents for an acute office visit.  Complains of left earache x 1 week. Denies any chest congestion/tightness, sinus pressure/drainage, fever, nausea or vomiting.  Complains as he's had ear wax problems in the past required irrigation. He denies any fever, ear drainage, injury or decreased hearing.  Current Medications, Allergies, Complete Past Medical History, Past Surgical History, Family History, and Social History were reviewed in [employer] record.  ROS   Constitutional:   No  weight loss, night sweats,  Fevers, chills, fatigue, or  lassitude.  HEENT:   No headaches,  Difficulty swallowing,  Tooth/dental problems, or  Sore throat,                No sneezing, itching, +ear ache,  No nasal congestion, post nasal drip,   CV:  No chest pain,  Orthopnea, PND, swelling in lower extremities, anasarca, dizziness, palpitations, syncope.   GI  No heartburn, indigestion, abdominal pain, nausea, vomiting, diarrhea, change in bowel habits, loss of appetite, bloody stools.   Resp:    No chest wall deformity  Skin: no rash or lesions.  GU: no dysuria, change in color of urine, no urgency or frequency.  No flank pain, no hematuria   MS:  No joint pain or swelling.  No decreased range of motion.  No back pain.  Psych:  No change in mood or affect. No depression or anxiety.  No memory loss.       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    Lungs clear to A and P bilaterally without cough on insp or exp maneuvers  RRR no s3 or murmur or increase in P2  Abd soft and benign with nl excursion in the supine position. No bruits or organomegaly fem pulses intact  Ext warm without calf tenderness, cyanosis clubbing or edema, nl pulses  Skin no rash noted , small raised soft cystic area along external ear at tragus ? Cyst . No sign redness  Or streaking noted. Mild tenderness noted.    CXR PA and Lateral:   03/22/2014 :      No acute cardiopulmonary abnormality seen.   Labs ordered/ reviewed:   Lab 03/26/15 0815  NA 142  K 4.4  CL 106  CO2 30  BUN 17  CREATININE 0.96  GLUCOSE 103*   Lab 03/26/15 0815  HGB 15.0  HCT 44.3  WBC 6.8  PLT 295.0    Kamera Dubas NP-C  [facility]  11/26/2015   Assessment:

## 2015-11-26 NOTE — Assessment & Plan Note (Signed)
Left ear pain ? Due to cerumen impaction +/- superficial cyst noted along tragus area.  Advised to use warm compresses, if not resolving consider refer to dermatology .  Please contact office for sooner follow up if symptoms do not improve or worsen or seek emergency care

## 2015-11-26 NOTE — Patient Instructions (Signed)
Use Debrox As needed   Warm compresses to area on face , if not improving call back can refer to dermatology. Call for redness, fever or pain.  Please contact office for sooner follow up if symptoms do not improve or worsen or seek emergency care  Follow up Dr. Melvyn Novas  As planned and As needed

## 2015-11-26 NOTE — Assessment & Plan Note (Signed)
Left ear irrigation without difficulty . EAC clear and normal TM noted.  Plan  Use Debrox As needed    Please contact office for sooner follow up if symptoms do not improve or worsen or seek emergency care  Follow up Dr. Melvyn Novas  As planned and As need

## 2015-11-27 NOTE — Progress Notes (Signed)
Chart and office note reviewed in detail  > agree with a/p as outlined    

## 2016-02-19 ENCOUNTER — Telehealth: Payer: Self-pay | Admitting: Cardiology

## 2016-02-19 ENCOUNTER — Emergency Department (HOSPITAL_COMMUNITY)
Admission: EM | Admit: 2016-02-19 | Discharge: 2016-02-19 | Disposition: A | Discharge status: Home / Self Care | Payer: Medicare Other | Attending: Emergency Medicine | Admitting: Emergency Medicine

## 2016-02-19 ENCOUNTER — Encounter (HOSPITAL_COMMUNITY): Payer: Self-pay | Admitting: Emergency Medicine

## 2016-02-19 DIAGNOSIS — I471 Supraventricular tachycardia: Secondary | ICD-10-CM | POA: Diagnosis not present

## 2016-02-19 DIAGNOSIS — Z7982 Long term (current) use of aspirin: Secondary | ICD-10-CM | POA: Diagnosis not present

## 2016-02-19 DIAGNOSIS — Z8546 Personal history of malignant neoplasm of prostate: Secondary | ICD-10-CM | POA: Insufficient documentation

## 2016-02-19 LAB — CBC WITH DIFFERENTIAL/PLATELET
Basophils Absolute: 0 10*3/uL (ref 0.0–0.1)
Basophils Relative: 0 %
Eosinophils Absolute: 0.2 10*3/uL (ref 0.0–0.7)
Eosinophils Relative: 2 %
HCT: 49 % (ref 39.0–52.0)
Hemoglobin: 16.9 g/dL (ref 13.0–17.0)
Lymphocytes Relative: 45 %
Lymphs Abs: 3.8 10*3/uL (ref 0.7–4.0)
MCH: 33 pg (ref 26.0–34.0)
MCHC: 34.5 g/dL (ref 30.0–36.0)
MCV: 95.7 fL (ref 78.0–100.0)
Monocytes Absolute: 0.6 10*3/uL (ref 0.1–1.0)
Monocytes Relative: 7 %
Neutro Abs: 4 10*3/uL (ref 1.7–7.7)
Neutrophils Relative %: 46 %
Platelets: 271 10*3/uL (ref 150–400)
RBC: 5.12 MIL/uL (ref 4.22–5.81)
RDW: 12.7 % (ref 11.5–15.5)
WBC: 8.6 10*3/uL (ref 4.0–10.5)

## 2016-02-19 LAB — BASIC METABOLIC PANEL
Anion gap: 10 (ref 5–15)
BUN: 16 mg/dL (ref 6–20)
CO2: 24 mmol/L (ref 22–32)
Calcium: 9.5 mg/dL (ref 8.9–10.3)
Chloride: 106 mmol/L (ref 101–111)
Creatinine, Ser: 1.13 mg/dL (ref 0.61–1.24)
GFR calc Af Amer: >60 mL/min (ref >60)
GFR calc non Af Amer: >60 mL/min (ref >60)
Glucose, Bld: 95 mg/dL (ref 65–99)
Potassium: 3.7 mmol/L (ref 3.5–5.1)
Sodium: 140 mmol/L (ref 135–145)

## 2016-02-19 LAB — MAGNESIUM: Magnesium: 2 mg/dL (ref 1.7–2.4)

## 2016-02-19 LAB — TROPONIN I: Troponin I: <0.03 ng/mL (ref <0.03)

## 2016-02-19 MED ORDER — METOPROLOL TARTRATE 5 MG/5ML IV SOLN
5.0000 mg | Freq: Once | INTRAVENOUS | Status: AC
  Administered 2016-02-19: 5 mg via INTRAVENOUS
  Filled 2016-02-19: qty 5

## 2016-02-19 MED ORDER — SODIUM CHLORIDE 0.9 % IV BOLUS (SEPSIS)
1000.0000 mL | Freq: Once | INTRAVENOUS | Status: AC
  Administered 2016-02-19: 1000 mL via INTRAVENOUS

## 2016-02-19 MED ORDER — METOPROLOL SUCCINATE ER 25 MG PO TB24
25.0000 mg | ORAL_TABLET | Freq: Every day | ORAL | 0 refills | Status: DC
Start: 2016-02-19 — End: 2017-03-10

## 2016-02-19 MED ORDER — ADENOSINE 6 MG/2ML IV SOLN
INTRAVENOUS | Status: AC
  Filled 2016-02-19: qty 4

## 2016-02-19 NOTE — Telephone Encounter (Signed)
Add on [employer]

## 2016-02-19 NOTE — ED Notes (Signed)
[place], MD at bedside. 

## 2016-02-19 NOTE — Telephone Encounter (Signed)
New Message  Pt calling to speak with nurse-would not specify nature of call. Please follow-up.

## 2016-02-19 NOTE — Telephone Encounter (Signed)
Spoke with pt, he has been having more frequent episodes of palpitations for the last year or two. He is usually able to get it to break and today he was not able to. They did catch the SVT on ECG in the ER. He had only missed one dose of metoprolol, so he does not feel it is related to that. He wants to see dr Stanford Breed to discuss the options at this point. The 1st available is 9-26. He is not interested in seeing APP. Will forward for dr Stanford Breed review

## 2016-02-19 NOTE — Telephone Encounter (Signed)
Spoke to pt - he went to ED this AM for SVT Came home, OK now but wants to have f/u visit w/ Dr. Stanford Breed to discuss. Offered a PA appt as Dr. Jacalyn Lefevre next available not until end of August. Pt politely declined PA OV, asked to speak to Dr. Jacalyn Lefevre nurse or see what Dr. Stanford Breed would advise. Best number to reach pt is cell - [phone]

## 2016-02-19 NOTE — ED Provider Notes (Signed)
[facility] DEPT Provider Note   CSN: [id number] Arrival date & time: 02/19/16  1154  First Provider Contact:  First MD Initiated Contact with Patient 02/19/16 1209     By signing my name below, I, Eustaquio Maize, attest that this documentation has been prepared under the direction and in the presence of Virgel Manifold, MD. Electronically Signed: Eustaquio Maize, ED Scribe. 02/19/16. 12:16 PM.   History   Chief Complaint Chief Complaint  Patient presents with  . SVT    HPI Cory Vasquez is a 70 y.o. male who presents to the Emergency Department complaining of intermittent episodes of irregular racing heart rate that began earlier today. Pt reports that he was walking outside when he began feeling his heart racing with chest tightness. Pt sat down on the curb to wait and see if his symptoms resolved on their own. A neighbor walked by who's wife is a Marine scientist. The nurse checked pt's BP and stated it was low and advised that pt be taken by an ambulance to the ED. Pt wanted to wait for wife to get on scene. Prior to wife's arrival his symptoms resided on their own. He then went to [facility] when he began having the same palpitations and chest tightness, prompting him to come to the ED. Pt has had these issues in the past and takes Metoprolol. He reports that he has been out of the Metoprolol for the past day or so and missed his medication today. Denies diaphoresis, shortness of breath, nausea, vomiting, or any other associated symptoms.   The history is provided by the patient. No language interpreter was used.    Past Medical History:  Diagnosis Date  . Diverticulosis    see colonoscopy 5/06 rec fu 11/2014  . HBP (high blood pressure)    ACE d/c'd 11/02/08. cough/sorethroat > resolved  . Health maintenance examination    [name]. Td June 30/2011. CPX June 30/2011  . Palpitations    neg myoview 11/05 with EF 58%  . Prostate cancer [facility])     Patient Active Problem List   Diagnosis  Date Noted  . Ear pain 11/26/2015  . Health care maintenance 03/27/2015  . Chest pain 03/20/2015  . Chronic rhinitis 03/22/2014  . Prostate nodule without urinary obstruction dx prostate ca/ limited 02/2014  03/06/2013  . Carbuncle 11/10/2012  . URI, acute 09/26/2012  . Cerumen impaction 06/08/2011  . Hyperlipidemia 06/14/2007  . Essential hypertension 06/14/2007  . Diverticulosis of colon without hemorrhage 06/14/2007  . Palpitations 06/14/2007    Past Surgical History:  Procedure Laterality Date  . BACK SURGERY     [name] 11/08       Home Medications    Prior to Admission medications   Medication Sig Start Date End Date Taking? Authorizing Provider  aspirin (BAYER LOW STRENGTH) 81 MG EC tablet Take 81 mg by mouth daily.      Historical Provider, MD  losartan-hydrochlorothiazide (HYZAAR) 100-25 MG tablet Take 1 tablet by mouth daily. 05/14/15   Tanda Rockers, MD  metoprolol tartrate (LOPRESSOR) 25 MG tablet Take 1 tablet (25 mg total) by mouth 2 (two) times daily. 03/20/15   Lelon Perla, MD  Multiple Vitamin (MULTIVITAMIN WITH MINERALS) TABS tablet Take 1 tablet by mouth daily.    Historical Provider, MD    Family History Family History  Problem Relation Age of Onset  . Heart disease Father 30  . Diabetes Brother     Social History Social History  Substance Use  Topics  . Smoking status: Never Smoker  . Smokeless tobacco: Never Used  . Alcohol use 3.0 - 3.6 oz/week    5 - 6 Standard drinks or equivalent per week     Comment: Occasional     Allergies   Review of patient's allergies indicates no known allergies.   Review of Systems Review of Systems  Constitutional: Negative for diaphoresis.  Respiratory: Positive for chest tightness. Negative for shortness of breath.   Cardiovascular: Positive for palpitations. Negative for chest pain.  Gastrointestinal: Negative for nausea and vomiting.  All other systems reviewed and are negative.    Physical  Exam Updated Vital Signs There were no vitals taken for this visit.  Physical Exam  Constitutional: He is oriented to person, place, and time. He appears well-developed and well-nourished.  HENT:  Head: Normocephalic and atraumatic.  Eyes: EOM are normal.  Neck: Normal range of motion.  Cardiovascular: Normal rate, regular rhythm, normal heart sounds and intact distal pulses.   Pulmonary/Chest: Effort normal and breath sounds normal. No respiratory distress.  Abdominal: Soft.  Musculoskeletal: Normal range of motion.  Neurological: He is alert and oriented to person, place, and time.  Skin: Skin is warm and dry.  Psychiatric: He has a normal mood and affect. Judgment normal.  Nursing note and vitals reviewed.    ED Treatments / Results   DIAGNOSTIC STUDIES: Oxygen Saturation is on RA, normal by my interpretation.    COORDINATION OF CARE: 12:17 PM-Discussed treatment plan which includes 5 mg Metoprolol with pt at bedside and pt agreed to plan.   Labs (all labs ordered are listed, but only abnormal results are displayed) Labs Reviewed - No data to display  EKG  EKG Interpretation None       Radiology No results found.  Procedures Procedures (including critical care time)  Medications Ordered in ED Medications  metoprolol (LOPRESSOR) injection 5 mg (5 mg Intravenous Given 02/19/16 1227)  sodium chloride 0.9 % bolus 1,000 mL (1,000 mLs Intravenous New Bag/Given 02/19/16 1219)     Initial Impression / Assessment and Plan / ED Course  I have reviewed the triage vital signs and the nursing notes.  Pertinent labs & imaging results that were available during my care of the patient were reviewed by me and considered in my medical decision making (see chart for details).  Clinical Course   70 year old male with palpitations. Hx of nonsustained ventricular tachycardia on a prior exercise treadmill but his LV function was normal.  Followed by Dr Stanford Breed. He has been on  metoprolol and sounds like his symptoms have been fairly well controlled. He actually had SVT today which spontaneously converted just after getting to the ED. He reports some of his episodes have felt similar to the way he did today. He is currently out of his beta blocker. Missed dose today and potentially yesterday. He is now asymptomatic, in NSR and w/u unremarkable. He was given a small dose of IV metoprolol in the ED. Will provide prescription for metoprolol and have him follow back up with Dr Stanford Breed as an outpt.    I personally preformed the services scribed in my presence. The recorded information has been reviewed is accurate. Virgel Manifold, MD.    Final Clinical Impressions(s) / ED Diagnoses   Final diagnoses:  SVT (supraventricular tachycardia) [facility])    New Prescriptions New Prescriptions   No medications on file     Virgel Manifold, MD 02/22/16 1827

## 2016-02-20 NOTE — Telephone Encounter (Signed)
Spoke with pt, Follow up scheduled  

## 2016-02-27 NOTE — Progress Notes (Signed)
HPI: FU SVT. He did have nonsustained ventricular tachycardia on a prior exercise treadmill but his LV function is normal. A previous Myoview in November 2005 showed an ejection fraction of 58% with no ischemia or infarction. Echo 8/16 showed normal LV function, grade 1 diastolic dysfunction. Event monitor 8/16 negative. TSH 8/16 normal. ETT 10/16 negative. 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 He converted spontaneously to sinus rhythm.Since last seen, He has had episodes of palpitations typically lasting approximately 1 minute to 3 minutes. His episodes leading to ER visit lasted longer. There is associated dizziness, dyspnea and mild chest heaviness. No syncope.  Current Outpatient Prescriptions  Medication Sig Dispense Refill  . aspirin (BAYER LOW STRENGTH) 81 MG EC tablet Take 81 mg by mouth daily.      Marland Kitchen losartan-hydrochlorothiazide (HYZAAR) 100-25 MG tablet Take 1 tablet by mouth daily. 90 tablet 3  . metoprolol succinate (TOPROL-XL) 25 MG 24 hr tablet Take 1 tablet (25 mg total) by mouth daily. 30 tablet 0  . metoprolol tartrate (LOPRESSOR) 25 MG tablet Take 1 tablet (25 mg total) by mouth 2 (two) times daily.    . Multiple Vitamin (MULTIVITAMIN WITH MINERALS) TABS tablet Take 1 tablet by mouth daily.     No current facility-administered medications for this visit.     No Known Allergies   Past Medical History:  Diagnosis Date  . Diverticulosis    see colonoscopy 5/06 rec fu 11/2014  . HBP (high blood pressure)    ACE d/c'd 11/02/08. cough/sorethroat > resolved  . Health maintenance examination    [name]. Td June 30/2011. CPX June 30/2011  . Palpitations    neg myoview 11/05 with EF 58%  . Prostate cancer [facility])     Past Surgical History:  Procedure Laterality Date  . BACK SURGERY     [name] 11/08    Social History   Social History  . Marital status:  Married    Spouse name: N/A  . Number of children: 2  . Years of education: N/A   Occupational History  . Salesman    Social History Main Topics  . Smoking status: Never Smoker  . Smokeless tobacco: Never Used  . Alcohol use 3.0 - 3.6 oz/week    5 - 6 Standard drinks or equivalent per week     Comment: Occasional  . Drug use: No  . Sexual activity: Not on file   Other Topics Concern  . Not on file   Social History Narrative   Denies flu shot 09/29/2010          Family History  Problem Relation Age of Onset  . Heart disease Father 64  . Diabetes Brother     ROS: no fevers or chills, productive cough, hemoptysis, dysphasia, odynophagia, melena, hematochezia, dysuria, hematuria, rash, seizure activity, orthopnea, PND, pedal edema, claudication. Remaining systems are negative.  Physical Exam:   Blood pressure 138/80, pulse (!) 58, height 6' (1.829 m), weight 194 lb (88 kg).  General:  Well developed/well nourished in NAD Skin warm/dry Patient not depressed No peripheral clubbing Back-normal HEENT-normal/normal eyelids Neck supple/normal carotid upstroke bilaterally; no bruits; no JVD; no thyromegaly chest - CTA/ normal expansion CV - RRR/normal S1 and S2; no murmurs, rubs or gallops;  PMI nondisplaced Abdomen -NT/ND, no HSM, no mass, + bowel sounds, no bruit 2+ femoral pulses, no bruits Ext-no edema, chords, 2+ DP  Neuro-grossly nonfocal   A/P  1 Supraventricular tachycardia-the patient has had increased frequency of SVT. We discussed options of ablation today. He would like to defer at this point. If his symptoms become more frequent then he would consider then. Continue beta blockade. We also discussed Valsalva maneuvers that would potentially terminate his arrhythmia.  2 hypertension-blood pressure controlled. Continue present medications.  3 yperlipidemia-management per primary care.  Kirk Ruths

## 2016-03-05 ENCOUNTER — Encounter: Payer: Self-pay | Admitting: Cardiology

## 2016-03-05 ENCOUNTER — Ambulatory Visit (INDEPENDENT_AMBULATORY_CARE_PROVIDER_SITE_OTHER): Payer: Medicare Other | Admitting: Cardiology

## 2016-03-05 VITALS — BP 138/80 | HR 58 | Ht 72.0 in | Wt 194.0 lb

## 2016-03-05 DIAGNOSIS — I10 Essential (primary) hypertension: Secondary | ICD-10-CM

## 2016-03-05 DIAGNOSIS — I471 Supraventricular tachycardia: Secondary | ICD-10-CM | POA: Diagnosis not present

## 2016-03-05 DIAGNOSIS — E785 Hyperlipidemia, unspecified: Secondary | ICD-10-CM

## 2016-03-05 NOTE — Patient Instructions (Signed)
Medication Instructions:   NO CHANGE  Follow-Up: Your physician wants you to follow-up in: 6 MONTHS WITH DR [name]. You will receive a reminder letter in the mail two months in advance. If you don't receive a letter, please call our office to schedule the follow-up appointment.  If you need a refill on your cardiac medications before your next appointment, please call your pharmacy.   

## 2016-03-25 ENCOUNTER — Ambulatory Visit (INDEPENDENT_AMBULATORY_CARE_PROVIDER_SITE_OTHER)
Admission: RE | Admit: 2016-03-25 | Discharge: 2016-03-25 | Disposition: A | Discharge status: Home / Self Care | Payer: Medicare Other | Source: Ambulatory Visit | Attending: Internal Medicine | Admitting: Internal Medicine

## 2016-03-25 ENCOUNTER — Ambulatory Visit (INDEPENDENT_AMBULATORY_CARE_PROVIDER_SITE_OTHER): Payer: Medicare Other | Admitting: Internal Medicine

## 2016-03-25 ENCOUNTER — Other Ambulatory Visit (INDEPENDENT_AMBULATORY_CARE_PROVIDER_SITE_OTHER): Payer: Medicare Other

## 2016-03-25 ENCOUNTER — Encounter: Payer: Self-pay | Admitting: Internal Medicine

## 2016-03-25 VITALS — BP 118/82 | HR 56 | Ht 72.0 in | Wt 197.6 lb

## 2016-03-25 DIAGNOSIS — E785 Hyperlipidemia, unspecified: Secondary | ICD-10-CM

## 2016-03-25 DIAGNOSIS — Z0000 Encounter for general adult medical examination without abnormal findings: Secondary | ICD-10-CM

## 2016-03-25 DIAGNOSIS — Z23 Encounter for immunization: Secondary | ICD-10-CM | POA: Diagnosis not present

## 2016-03-25 DIAGNOSIS — I10 Essential (primary) hypertension: Secondary | ICD-10-CM

## 2016-03-25 DIAGNOSIS — R002 Palpitations: Secondary | ICD-10-CM | POA: Diagnosis not present

## 2016-03-25 LAB — CBC WITH DIFFERENTIAL/PLATELET
Basophils Absolute: 0 K/uL (ref 0.0–0.1)
Basophils Relative: 0.3 % (ref 0.0–3.0)
Eosinophils Absolute: 0.2 K/uL (ref 0.0–0.7)
Eosinophils Relative: 3.5 % (ref 0.0–5.0)
HCT: 41.1 % (ref 39.0–52.0)
Hemoglobin: 14.1 g/dL (ref 13.0–17.0)
Lymphocytes Relative: 33.7 % (ref 12.0–46.0)
Lymphs Abs: 2 K/uL (ref 0.7–4.0)
MCHC: 34.4 g/dL (ref 30.0–36.0)
MCV: 94.7 fl (ref 78.0–100.0)
Monocytes Absolute: 0.6 K/uL (ref 0.1–1.0)
Monocytes Relative: 9.4 % (ref 3.0–12.0)
Neutro Abs: 3.2 K/uL (ref 1.4–7.7)
Neutrophils Relative %: 53.1 % (ref 43.0–77.0)
Platelets: 280 K/uL (ref 150.0–400.0)
RBC: 4.34 Mil/uL (ref 4.22–5.81)
RDW: 13.1 % (ref 11.5–15.5)
WBC: 6 K/uL (ref 4.0–10.5)

## 2016-03-25 LAB — LIPID PANEL
CHOLESTEROL: 182 mg/dL (ref 0–200)
HDL: 54.7 mg/dL (ref >39.00)
LDL Cholesterol: 97 mg/dL (ref 0–99)
NONHDL: 127.79
Total CHOL/HDL Ratio: 3
Triglycerides: 154 mg/dL — ABNORMAL HIGH (ref 0.0–149.0)
VLDL: 30.8 mg/dL (ref 0.0–40.0)

## 2016-03-25 LAB — BASIC METABOLIC PANEL
BUN: 20 mg/dL (ref 6–23)
CHLORIDE: 105 meq/L (ref 96–112)
CO2: 31 mEq/L (ref 19–32)
Calcium: 8.7 mg/dL (ref 8.4–10.5)
Creatinine, Ser: 0.96 mg/dL (ref 0.40–1.50)
GFR: 82.54 mL/min (ref >60.00)
Glucose, Bld: 80 mg/dL (ref 70–99)
POTASSIUM: 3.8 meq/L (ref 3.5–5.1)
SODIUM: 140 meq/L (ref 135–145)

## 2016-03-25 LAB — TSH: TSH: 0.79 u[IU]/mL (ref 0.35–4.50)

## 2016-03-25 NOTE — Patient Instructions (Signed)
To get the most out of exercise, you need to be continuously aware that you are short of breath, but never out of breath, for 30 minutes daily. As you improve, it will actually be easier for you to do the same amount of exercise  in  30 minutes so always push to the level where you are short of breath.    Prevnar 13 today    Please remember to go to the lab and x-ray department downstairs for your tests - we will call you with the results when they are available.    Return 03/25/17 for cpx

## 2016-03-25 NOTE — Progress Notes (Signed)
Subjective:     Patient ID: Cory Vasquez, male   DOB: 1945/12/01 .   MRN: [id number]  Brief patient profile:  33 yowm never smoker with history of palpitations and hypertension and nonsustained ventricular tachycardia responsive to beta blockers.     History of Present Illness  November 02, 2008 ov cough after doubled lisinopril assoc with intense sore throat withouth sign nasal symptoms, sob, excess mucus or purulent sputum. No itching / sneezing. rec d/c acei, resolved    03/26/2015 f/u ov/Cory Vasquez re: hbp/ palpitations  Chief Complaint  Patient presents with  . Annual Exam    Pt is fasting. He states he has been having palpitaions and is currently on a holter monitor.   Brisk walk 25-30 min 3-4 times per week including up hills/ only one time palpitations with ex and some chest tightness on 25 mg once daily > 30 day holter and increase to bid but has not started yet. rec Please see patient coordinator before you leave today  to schedule colonoscopy before the end of the year I recommend you get the Prevnar13 this next year   03/25/2016  f/u ov/Cory Vasquez re: hbp/ palpitations/ annual eval  Chief Complaint  Patient presents with  . Annual Exam    No concerns.   has not had colonoscopy but wants to schedule it himself Not limited by breathing from desired activities / no by cp/ claudication  No   chronic cough or  chest tightness, subjective wheeze or overt sinus or hb symptoms. No unusual exp hx or h/o childhood pna/ asthma or knowledge of premature birth.  Sleeping ok without nocturnal  or early am exacerbation  of respiratory  c/o's or need for noct saba. Also denies any obvious fluctuation of symptoms with weather or environmental changes or other aggravating or alleviating factors except as outlined above   Current Medications, Allergies, Complete Past Medical History, Past Surgical History, Family History, and Social History were reviewed in [employer] [facility]  record.  ROS  The following are not active complaints unless bolded sore throat, dysphagia, dental problems, itching, sneezing,  nasal congestion or excess/ purulent secretions, ear ache,   fever, chills, sweats, unintended wt loss, classically pleuritic or exertional cp, hemoptysis,  orthopnea pnd or leg swelling, presyncope, palpitations, abdominal pain, anorexia, nausea, vomiting, diarrhea  or change in bowel or bladder habits, change in stools or urine, dysuria,hematuria,  rash, arthralgias, visual complaints, headache, numbness, weakness or ataxia or problems with walking or coordination,  change in mood/affect or memory.                         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  external ear canals  With mild wax buildup bilaterally s obst  NECK :  without JVD/Nodes/TM/ nl carotid upstrokes bilaterally   LUNGS: no acc muscle use,  Nl contour chest which is clear to A and P bilaterally without cough on insp or exp maneuvers   CV:  RRR  no s3 or murmur or increase in P2, no edema    ABD:  soft and nontender with nl inspiratory excursion in the supine position. No bruits or organomegaly, bowel sounds nl  MS:  Nl gait/ ext warm without deformities, calf tenderness, cyanosis or clubbing No obvious joint restrictions   SKIN: warm and dry without lesions    NEURO:  alert, approp, nl sensorium with  no motor deficits  GU/rectal:  Per urology    CXR PA and Lateral:   03/25/2016 :    I personally reviewed images and agree with radiology impression as follows:    There is no active cardiopulmonary disease.   Labs ordered/ reviewed:      Chemistry      Component Value Date/Time   NA 140 03/25/2016 0951   K 3.8 03/25/2016 0951   CL 105 03/25/2016 0951   CO2 31 03/25/2016 0951   BUN 20 03/25/2016 0951   CREATININE 0.96 03/25/2016 0951      Component Value Date/Time   CALCIUM 8.7 03/25/2016 0951   ALKPHOS 95 04/25/2015 1103   AST 23 04/25/2015 1103   ALT 24 04/25/2015 1103   BILITOT 0.4 04/25/2015 1103        Lab Results  Component Value Date   WBC 6.0 03/25/2016   HGB 14.1 03/25/2016   HCT 41.1 03/25/2016   MCV 94.7 03/25/2016   PLT 280.0 03/25/2016         Lab Results  Component Value Date   TSH 0.79 03/25/2016                  Assessment:

## 2016-03-26 ENCOUNTER — Ambulatory Visit: Payer: Medicare Other | Admitting: Internal Medicine

## 2016-03-26 NOTE — Progress Notes (Signed)
Spoke with pt and notified of results per Dr. [name]. Pt verbalized understanding and denied any questions. 

## 2016-03-26 NOTE — Progress Notes (Signed)
LMTCB

## 2016-03-26 NOTE — Progress Notes (Signed)
lmtcb

## 2016-03-27 ENCOUNTER — Encounter: Payer: Self-pay | Admitting: Internal Medicine

## 2016-03-27 NOTE — Assessment & Plan Note (Addendum)
Followed   by Stanford Breed     - neg myoview 05/2004 with EF 58%    - Holter 03/22/15 ok per Dr Stanford Breed  Controlled on bid lopressor >f/u cards planned

## 2016-03-27 NOTE — Assessment & Plan Note (Signed)
Adequate control on present rx, reviewed > no change in rx needed   

## 2016-03-27 NOTE — Assessment & Plan Note (Signed)
-   Target LDL < 130 as male, hbp, pos fm hx  Lab Results  Component Value Date   CHOL 182 03/25/2016   HDL 54.70 03/25/2016   LDLCALC 97 03/25/2016   TRIG 154.0 (H) 03/25/2016   CHOLHDL 3 03/25/2016   Adequate control on present rx, reviewed > no change in rx needed  = diet/ ex

## 2016-03-27 NOTE — Assessment & Plan Note (Signed)
prevnar due > given 03/25/2016  Also rec colonoscopy but he declined to let me schedule it for him as happened last year

## 2016-06-08 ENCOUNTER — Other Ambulatory Visit: Payer: Self-pay | Admitting: Internal Medicine

## 2016-10-06 DIAGNOSIS — H2513 Age-related nuclear cataract, bilateral: Secondary | ICD-10-CM | POA: Diagnosis not present

## 2016-10-22 DIAGNOSIS — L570 Actinic keratosis: Secondary | ICD-10-CM | POA: Diagnosis not present

## 2016-10-22 DIAGNOSIS — L821 Other seborrheic keratosis: Secondary | ICD-10-CM | POA: Diagnosis not present

## 2016-10-22 DIAGNOSIS — Z85828 Personal history of other malignant neoplasm of skin: Secondary | ICD-10-CM | POA: Diagnosis not present

## 2016-12-13 ENCOUNTER — Other Ambulatory Visit: Payer: Self-pay | Admitting: Adult Health

## 2017-01-06 DIAGNOSIS — C61 Malignant neoplasm of prostate: Secondary | ICD-10-CM | POA: Diagnosis not present

## 2017-01-13 DIAGNOSIS — C61 Malignant neoplasm of prostate: Secondary | ICD-10-CM | POA: Diagnosis not present

## 2017-02-01 ENCOUNTER — Telehealth: Payer: Self-pay | Admitting: Internal Medicine

## 2017-02-01 NOTE — Telephone Encounter (Signed)
Error.Stanley A Dalton ° °

## 2017-02-15 DIAGNOSIS — L570 Actinic keratosis: Secondary | ICD-10-CM | POA: Diagnosis not present

## 2017-03-09 ENCOUNTER — Ambulatory Visit (INDEPENDENT_AMBULATORY_CARE_PROVIDER_SITE_OTHER): Payer: Medicare Other | Admitting: Internal Medicine

## 2017-03-09 ENCOUNTER — Encounter: Payer: Self-pay | Admitting: Internal Medicine

## 2017-03-09 ENCOUNTER — Other Ambulatory Visit (INDEPENDENT_AMBULATORY_CARE_PROVIDER_SITE_OTHER): Payer: Medicare Other

## 2017-03-09 ENCOUNTER — Ambulatory Visit (INDEPENDENT_AMBULATORY_CARE_PROVIDER_SITE_OTHER)
Admission: RE | Admit: 2017-03-09 | Discharge: 2017-03-09 | Disposition: A | Discharge status: Home / Self Care | Payer: Medicare Other | Source: Ambulatory Visit | Attending: Internal Medicine | Admitting: Internal Medicine

## 2017-03-09 VITALS — BP 120/78 | HR 53 | Ht 72.0 in | Wt 194.0 lb

## 2017-03-09 DIAGNOSIS — K5730 Diverticulosis of large intestine without perforation or abscess without bleeding: Secondary | ICD-10-CM

## 2017-03-09 DIAGNOSIS — R002 Palpitations: Secondary | ICD-10-CM

## 2017-03-09 DIAGNOSIS — E7800 Pure hypercholesterolemia, unspecified: Secondary | ICD-10-CM

## 2017-03-09 DIAGNOSIS — I10 Essential (primary) hypertension: Secondary | ICD-10-CM

## 2017-03-09 DIAGNOSIS — N402 Nodular prostate without lower urinary tract symptoms: Secondary | ICD-10-CM | POA: Diagnosis not present

## 2017-03-09 DIAGNOSIS — H6122 Impacted cerumen, left ear: Secondary | ICD-10-CM | POA: Diagnosis not present

## 2017-03-09 LAB — CBC WITH DIFFERENTIAL/PLATELET
BASOS PCT: 0.3 % (ref 0.0–3.0)
Basophils Absolute: 0 10*3/uL (ref 0.0–0.1)
EOS ABS: 0.2 10*3/uL (ref 0.0–0.7)
Eosinophils Relative: 3.7 % (ref 0.0–5.0)
HCT: 46.4 % (ref 39.0–52.0)
Hemoglobin: 15.5 g/dL (ref 13.0–17.0)
Lymphocytes Relative: 35.8 % (ref 12.0–46.0)
Lymphs Abs: 2 10*3/uL (ref 0.7–4.0)
MCHC: 33.4 g/dL (ref 30.0–36.0)
MCV: 97.4 fl (ref 78.0–100.0)
MONO ABS: 0.5 10*3/uL (ref 0.1–1.0)
Monocytes Relative: 9.2 % (ref 3.0–12.0)
NEUTROS ABS: 2.9 10*3/uL (ref 1.4–7.7)
NEUTROS PCT: 51 % (ref 43.0–77.0)
PLATELETS: 268 10*3/uL (ref 150.0–400.0)
RBC: 4.77 Mil/uL (ref 4.22–5.81)
RDW: 12.5 % (ref 11.5–15.5)
WBC: 5.6 10*3/uL (ref 4.0–10.5)

## 2017-03-09 LAB — BASIC METABOLIC PANEL
BUN: 15 mg/dL (ref 6–23)
CALCIUM: 9.4 mg/dL (ref 8.4–10.5)
CO2: 32 mEq/L (ref 19–32)
Chloride: 103 mEq/L (ref 96–112)
Creatinine, Ser: 0.95 mg/dL (ref 0.40–1.50)
GFR: 83.31 mL/min (ref >60.00)
GLUCOSE: 95 mg/dL (ref 70–99)
Potassium: 4.5 mEq/L (ref 3.5–5.1)
SODIUM: 139 meq/L (ref 135–145)

## 2017-03-09 LAB — URINALYSIS
BILIRUBIN URINE: NEGATIVE
Hgb urine dipstick: NEGATIVE
KETONES UR: NEGATIVE
LEUKOCYTES UA: NEGATIVE
Nitrite: NEGATIVE
PH: 6.5 (ref 5.0–8.0)
Specific Gravity, Urine: 1.015 (ref 1.000–1.030)
Total Protein, Urine: NEGATIVE
Urine Glucose: NEGATIVE
Urobilinogen, UA: 0.2 (ref 0.0–1.0)

## 2017-03-09 LAB — LIPID PANEL
CHOL/HDL RATIO: 3
CHOLESTEROL: 157 mg/dL (ref 0–200)
HDL: 49 mg/dL (ref >39.00)
LDL CALC: 77 mg/dL (ref 0–99)
NONHDL: 107.55
Triglycerides: 155 mg/dL — ABNORMAL HIGH (ref 0.0–149.0)
VLDL: 31 mg/dL (ref 0.0–40.0)

## 2017-03-09 LAB — HEPATIC FUNCTION PANEL
ALK PHOS: 73 U/L (ref 39–117)
ALT: 20 U/L (ref 0–53)
AST: 20 U/L (ref 0–37)
Albumin: 4.2 g/dL (ref 3.5–5.2)
BILIRUBIN DIRECT: 0.1 mg/dL (ref 0.0–0.3)
BILIRUBIN TOTAL: 0.6 mg/dL (ref 0.2–1.2)
Total Protein: 6.6 g/dL (ref 6.0–8.3)

## 2017-03-09 LAB — TSH: TSH: 1.2 u[IU]/mL (ref 0.35–4.50)

## 2017-03-09 NOTE — Progress Notes (Signed)
Subjective:     Patient ID: Cory Vasquez, male   DOB: 01-21-1946 .   MRN: [id number]  Brief patient profile:  3 yowm never smoker with history of palpitations and hypertension and nonsustained ventricular tachycardia responsive to beta blockers.     History of Present Illness  November 02, 2008 ov cough after doubled lisinopril assoc with intense sore throat withouth sign nasal symptoms, sob, excess mucus or purulent sputum. No itching / sneezing. rec d/c acei, resolved       03/25/2016  f/u ov/Bawi Lakins re: hbp/ palpitations/ annual eval  Chief Complaint  Patient presents with  . Annual Exam    No concerns.   has not had colonoscopy but wants to schedule it himself rec  To get the most out of exercise, you need to be continuously aware that you are short of breath, but never out of breath, for 30 minutes daily. As you improve, it will actually be easier for you to do the same amount of exercise  in  30 minutes so always push to the level where you are short of breath.   Prevnar 13 today  Please remember to go to the lab and x-ray department downstairs for your tests - we will call you with the results when they are available.   03/09/2017  f/u ov/Deray Dawes re:  Hbp/ palpitations  Chief Complaint  Patient presents with  . Follow-up    Here for physical exam. Pt has been doing good since last visit. No complaints or concerns. Denies any SOB, cough, or CP.  palpitations typically last up to 1.5 h and better if supine  = SVT, [name] said consider ablation last ov  03/05/16 reviewed with pt / has not had colonoscopy yet as rec  Not limited by breathing from desired activities  Nor claudication/ tia  No    excess/ purulent sputum or mucus plugs or hemoptysis or cp or chest tightness, subjective wheeze or overt sinus or hb symptoms. No unusual exp hx or h/o childhood pna/ asthma or knowledge of premature birth.  Sleeping ok without nocturnal  or early am exacerbation  of respiratory  c/o's or  need for noct saba. Also denies any obvious fluctuation of symptoms with weather or environmental changes or other aggravating or alleviating factors except as outlined above   Current Medications, Allergies, Complete Past Medical History, Past Surgical History, Family History, and Social History were reviewed in [employer] record.  ROS  The following are not active complaints unless bolded sore throat, dysphagia, dental problems, itching, sneezing,  nasal congestion or excess/ purulent secretions, ear ache,   fever, chills, sweats, unintended wt loss, classically pleuritic or exertional cp,  orthopnea pnd or leg swelling, presyncope, palpitations, abdominal pain, anorexia, nausea, vomiting, diarrhea  or change in bowel or bladder habits, change in stools or urine, dysuria,hematuria,  rash, arthralgias, visual complaints, headache, numbness, weakness or ataxia or problems with walking or coordination,  change in mood/affect or memory.                 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  external ear canals  With L ear obst with thick wax   NECK :  without JVD/Nodes/TM/ nl carotid upstrokes bilaterally   LUNGS: no acc muscle use,  Nl contour chest which is clear to A and P bilaterally without cough on insp or exp maneuvers   CV:  RRR  no s3 or murmur or increase in P2, no edema   ABD:  soft and nontender with nl inspiratory excursion in the supine position. No bruits or organomegaly, bowel sounds nl  MS:  Nl gait/ ext warm without deformities, calf tenderness, cyanosis or clubbing No obvious joint restrictions   SKIN: warm and dry without lesions    NEURO:  alert, approp, nl sensorium with  no motor deficits  GU/rectal:  Per urology Dr Towanda Octave ordered/ reviewed:      Chemistry      Component Value Date/Time   NA 139 03/09/2017 1027   K 4.5 03/09/2017 1027   CL 103 03/09/2017 1027   CO2 32 03/09/2017 1027   BUN 15 03/09/2017 1027   CREATININE 0.95 03/09/2017 1027      Component Value Date/Time   CALCIUM 9.4 03/09/2017 1027   ALKPHOS 73 03/09/2017 1027   AST 20 03/09/2017 1027   ALT 20 03/09/2017 1027   BILITOT 0.6 03/09/2017 1027        Lab Results  Component Value Date   WBC 5.6 03/09/2017   HGB 15.5 03/09/2017   HCT 46.4 03/09/2017   MCV 97.4 03/09/2017   PLT 268.0 03/09/2017         Lab Results  Component Value Date   TSH 1.20 03/09/2017      CXR PA and Lateral:   03/09/2017 :    I personally reviewed images and agree with radiology impression as follows:   There is no acute cardiopulmonary abnormality.   ekg 03/09/2017  SB/ o/w wnl with pr < .20               Assessment:

## 2017-03-09 NOTE — Patient Instructions (Addendum)
Schedule colonscopy at your convenience by the end of the year   Try the over the counter ear wax remover and if not better see Tammy NP for lavage of your left ear   Please remember to go to the lab and x-ray department downstairs in the basement  for your tests - we will call you with the results when they are available.     Please schedule a follow up visit in 12 months but call sooner if needed

## 2017-03-10 NOTE — Assessment & Plan Note (Signed)
-   Target LDL < 130 as male, hbp, pos fm hx  Lab Results  Component Value Date   CHOL 157 03/09/2017   HDL 49.00 03/09/2017   LDLCALC 77 03/09/2017   TRIG 155.0 (H) 03/09/2017   CHOLHDL 3 03/09/2017     Adequate control on present rx, reviewed in detail with pt > no change in rx needed  = diet/ ex

## 2017-03-10 NOTE — Assessment & Plan Note (Signed)
Adequate control on present rx, reviewed in detail with pt > no change in rx needed   

## 2017-03-10 NOTE — Assessment & Plan Note (Signed)
Referred to GI here (to establish) 03/26/2015 as due for f/u from outside colonoscopy done 2006  - referred again 03/09/2017      Lab Results  Component Value Date   HGB 15.5 03/09/2017   HGB 14.1 03/25/2016   HGB 16.9 02/19/2016

## 2017-03-10 NOTE — Assessment & Plan Note (Addendum)
Followed   by Stanford Breed     - neg myoview 05/2004 with EF 58%    - Holter 03/22/15 ok per Dr Stanford Breed  - consider ablation per Dr Stanford Breed  03/05/16   Referred back to Dr Stanford Breed, no change metaprolol dose in meantime as adequately Beta blocked

## 2017-03-10 NOTE — Assessment & Plan Note (Signed)
-   recurrent L ear 03/06/13  And 03/22/2014  And 03/09/2017 > rec otcs and f/u prn

## 2017-03-10 NOTE — Assessment & Plan Note (Signed)
referred to urology 03/06/2013 > dr Jeffie Pollock   due back to see Dr Jeffie Pollock advised

## 2017-06-24 ENCOUNTER — Other Ambulatory Visit: Payer: Self-pay | Admitting: Internal Medicine

## 2017-07-15 DIAGNOSIS — C61 Malignant neoplasm of prostate: Secondary | ICD-10-CM | POA: Diagnosis not present

## 2017-07-22 DIAGNOSIS — C61 Malignant neoplasm of prostate: Secondary | ICD-10-CM | POA: Diagnosis not present

## 2017-07-22 DIAGNOSIS — N402 Nodular prostate without lower urinary tract symptoms: Secondary | ICD-10-CM | POA: Diagnosis not present

## 2017-08-30 IMAGING — CT CT ABD-PELV W/ CM
2 of 5 series · 15 of 46 positions shown, 17 images · IV contrast (OMNIPAQUE 300)
Comparison: None.

CLINICAL DATA: Lower abdominal pain. Diverticulitis. Intermittent
abdominal pain and constipation for 1 week.

EXAM:
CT ABDOMEN AND PELVIS WITH CONTRAST
TECHNIQUE: Multidetector CT imaging of the abdomen and pelvis was performed
using the standard protocol following bolus administration of
intravenous contrast.
CONTRAST:  100mL OMNIPAQUE IOHEXOL 300 MG/ML  SOLN

[Series 2: abd/pel with · axial · 0.94mm/px · z∈[-630,-176]mm · 12 of 103 slices shown, 14 images]
[im 6/103  soft-tissue]
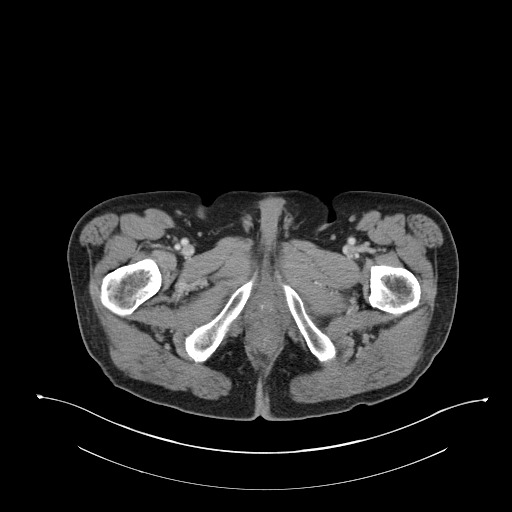
[im 6/103  bone]
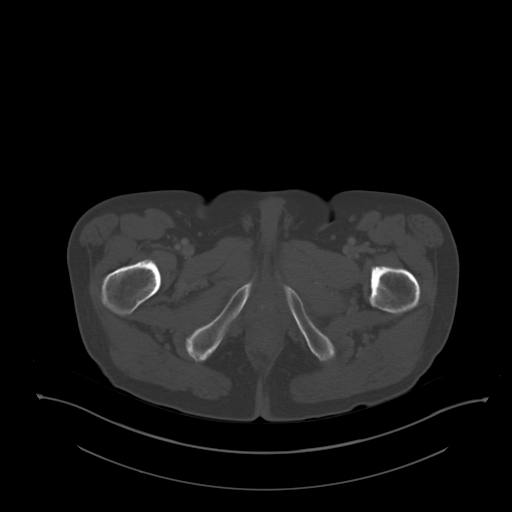
[im 16/103  soft-tissue]
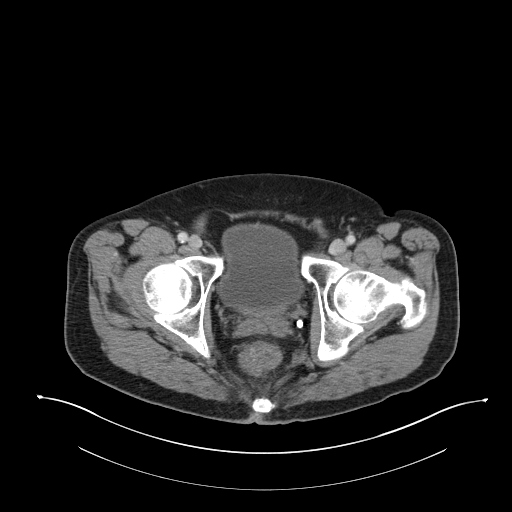
[im 21/103  soft-tissue]
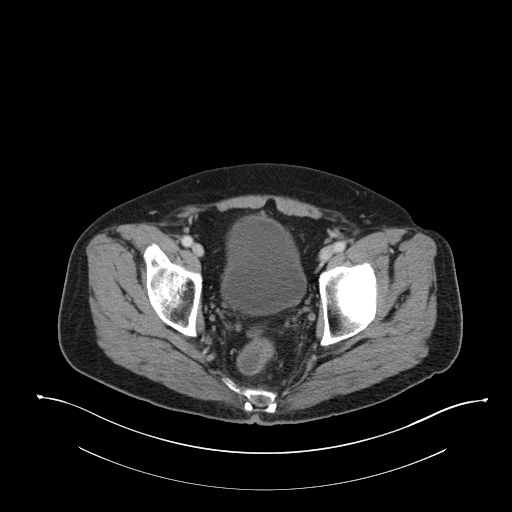
[im 31/103  soft-tissue]
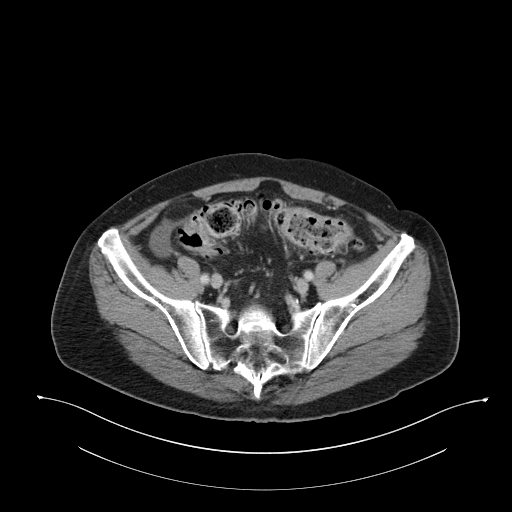
[im 41/103  soft-tissue]
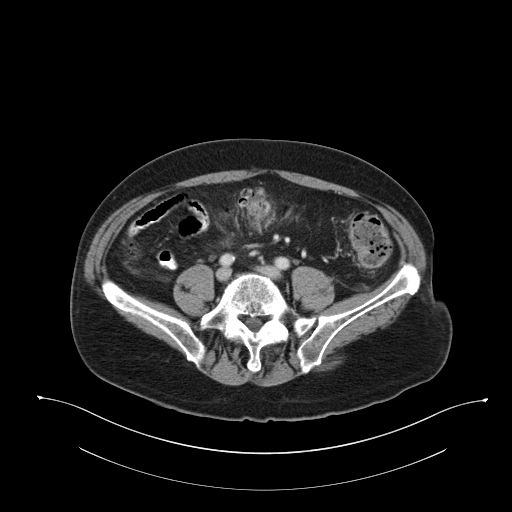
[im 46/103  soft-tissue]
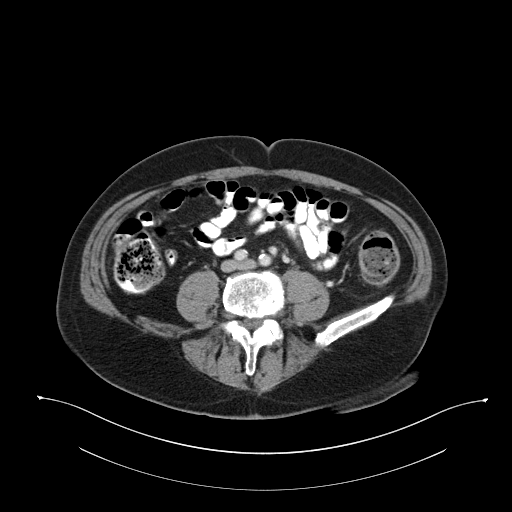
[im 57/103  soft-tissue]
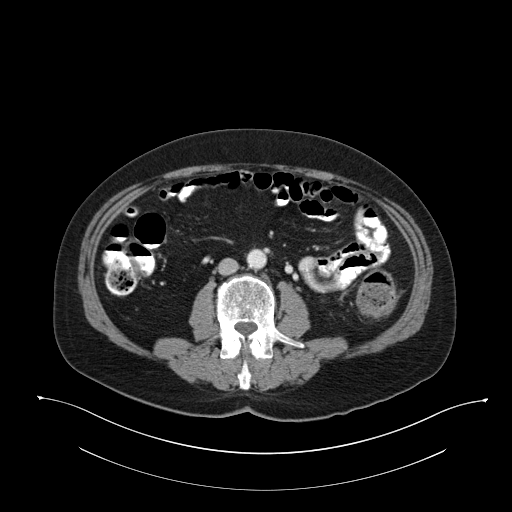
[im 62/103  soft-tissue]
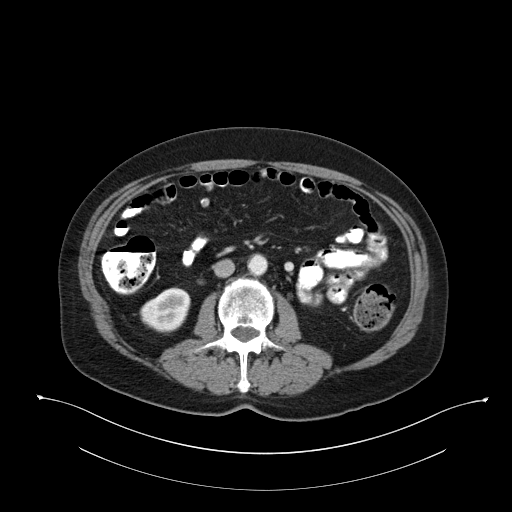
[im 72/103  soft-tissue]
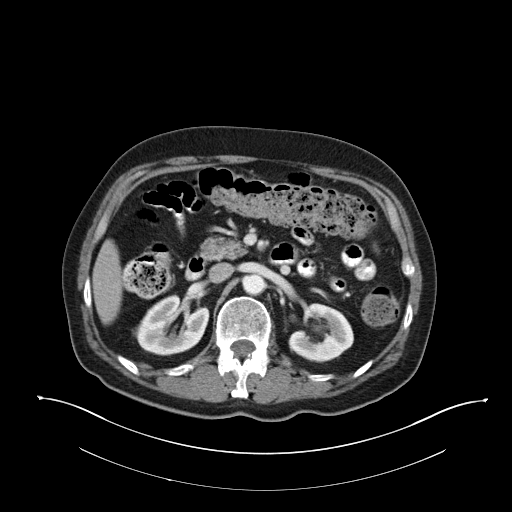
[im 72/103  bone]
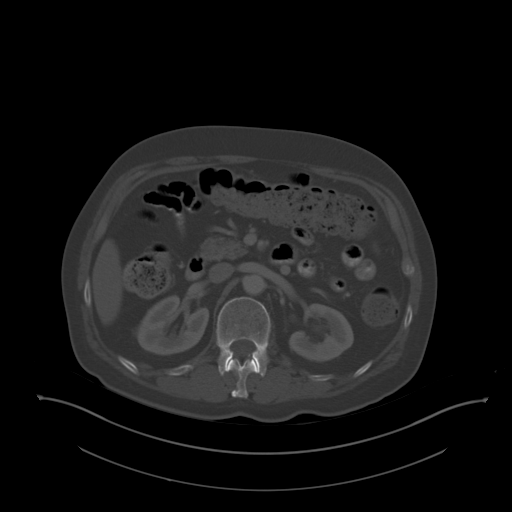
[im 82/103  soft-tissue]
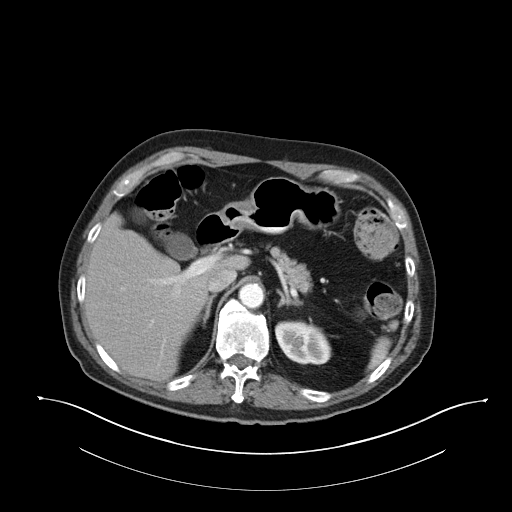
[im 87/103  soft-tissue]
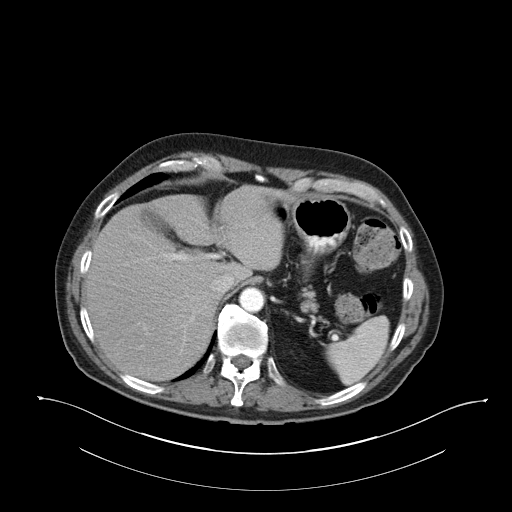
[im 97/103  soft-tissue]
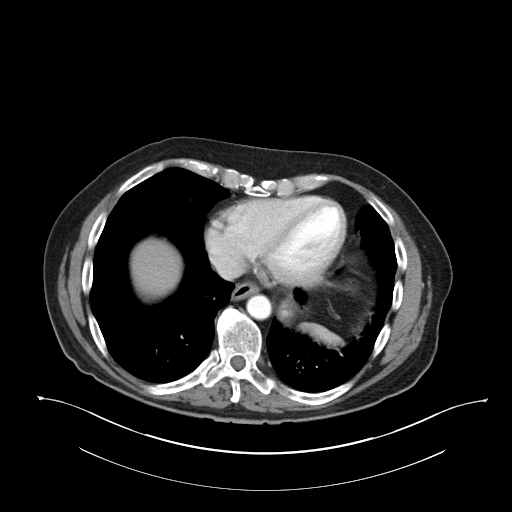

[Series 5: coronal a/|p · coronal · 0.78mm/px · 3 of 106 slices shown]
[im 36/106  soft-tissue]
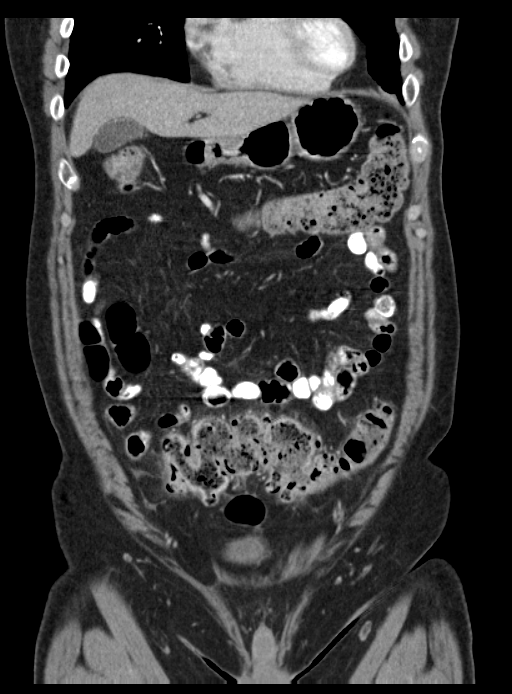
[im 47/106  soft-tissue]
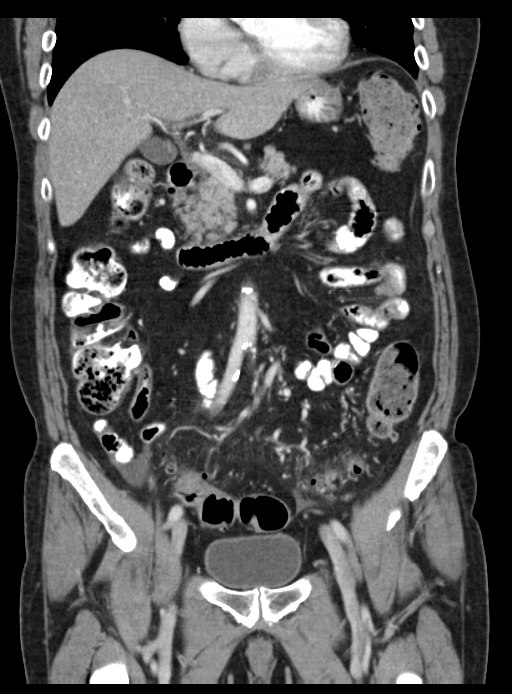
[im 59/106  soft-tissue]
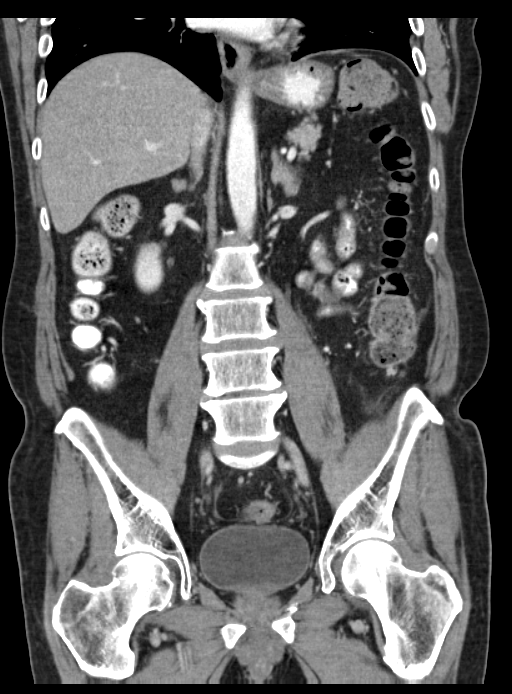

[15 of 46 positions shown; findings below may reference images not displayed]

FINDINGS: Musculoskeletal: No aggressive osseous lesions. Lumbar spondylosis.

Lung Bases: Dependent atelectasis.  Scarring in the lingula.

Liver:  Normal.

Spleen:  Normal.

Gallbladder:  Normal.

Common bile duct:  Normal.

Pancreas:  Normal.

Adrenal glands:  Normal bilaterally.

Kidneys: Normal renal enhancement and excretion. Subcentimeter
low-density lesion the LEFT upper renal pole likely represents a
simple renal cyst. LEFT ureter appears normal. RIGHT ureter normal.

Stomach:  Normal.

Small bowel:  No inflammatory changes or obstruction of small bowel.

Colon: There appears to be in normal appendix inferior to the cecum.
Colon is diffusely distended with stool. The stool burden becomes
progressively larger in the transverse and descending colon, worst
in the rectosigmoid. There is some stranding around the stool
distended transversely oriented sigmoid colon however there is no
focal diverticulitis, perforation or abscess.

The rectosigmoid junction is collapsed and a small amount of fluid
is present within the rectum. Mural thickening at the rectosigmoid
junction is probably due to collapse rather than colonic mass. If
screening colonoscopy has not been recently performed, it is
recommended.

Pelvic Genitourinary:  Normal.

Peritoneum: Small amount of free fluid is present in both lower
quadrants adjacent to the distended sigmoid colon. This probably
represents reactive free fluid. No upper abdominal free fluid.

Vascular/lymphatic: Atherosclerosis.

Body Wall: Normal.
IMPRESSION: 1. Large stool burden, particularly in the sigmoid colon.
2. Small amount of free fluid and subtle inflammatory changes in the
sigmoid mesocolon. Given the stool burden, the findings suggest
stercoral colitis. There is no focal inflammatory change to suggest
diverticulitis.

## 2017-10-08 DIAGNOSIS — L570 Actinic keratosis: Secondary | ICD-10-CM | POA: Diagnosis not present

## 2017-10-08 DIAGNOSIS — D0439 Carcinoma in situ of skin of other parts of face: Secondary | ICD-10-CM | POA: Diagnosis not present

## 2017-11-25 ENCOUNTER — Other Ambulatory Visit: Payer: Self-pay | Admitting: Internal Medicine

## 2017-12-17 ENCOUNTER — Other Ambulatory Visit: Payer: Self-pay | Admitting: Internal Medicine

## 2018-01-05 ENCOUNTER — Ambulatory Visit (INDEPENDENT_AMBULATORY_CARE_PROVIDER_SITE_OTHER): Payer: Medicare Other | Admitting: Adult Health

## 2018-01-05 ENCOUNTER — Encounter: Payer: Self-pay | Admitting: Adult Health

## 2018-01-05 DIAGNOSIS — H6122 Impacted cerumen, left ear: Secondary | ICD-10-CM | POA: Diagnosis not present

## 2018-01-05 NOTE — Progress Notes (Signed)
 @  Patient ID: Cory Vasquez, male    DOB: [birth year], 72 y.o.   MRN: [id number]  Chief Complaint  Patient presents with  . Follow-up    Ear fullness    Referring provider: Tanda Rockers, MD  HPI: 72 year old male never smoker followed for hypertension.  Primary care patient of Dr. Melvyn Novas   01/05/2018 Follow up ; Ear fullness  Patient resents for a work in visit today.  Patient complains of left ear fullness decreased hearing and pressure sensation.  Patient says his ear feels like it is filled up and cannot hear out of it.  No significant pain on the right ear. No drainage, fever or sinus congestion.  No Known Allergies  Immunization History  Administered Date(s) Administered  . Pneumococcal Conjugate-13 03/25/2016  . Td 01/23/2010    Past Medical History:  Diagnosis Date  . Diverticulosis    see colonoscopy 5/06 rec fu 11/2014  . HBP (high blood pressure)    ACE d/c'd 11/02/08. cough/sorethroat > resolved  . Health maintenance examination    [name]. Td June 30/2011. CPX June 30/2011  . Palpitations    neg myoview 11/05 with EF 58%  . Prostate cancer ([facility])     Tobacco History: Social History   Tobacco Use  Smoking Status Never Smoker  Smokeless Tobacco Never Used   Counseling given: Not Answered   Outpatient Encounter Medications as of 01/05/2018  Medication Sig  . aspirin (BAYER LOW STRENGTH) 81 MG EC tablet Take 81 mg by mouth daily.    Marland Kitchen losartan-hydrochlorothiazide (HYZAAR) 100-25 MG tablet TAKE 1 TABLET BY MOUTH EVERY DAY  . metoprolol tartrate (LOPRESSOR) 25 MG tablet TAKE 1 TABLET BY MOUTH TWICE DAILY  . Multiple Vitamin (MULTIVITAMIN WITH MINERALS) TABS tablet Take 1 tablet by mouth daily.  . sildenafil (REVATIO) 20 MG tablet Take 20 mg by mouth as needed.   No facility-administered encounter medications on file as of 01/05/2018.      Review of Systems  Constitutional:   No  weight loss, night sweats,  Fevers, chills, fatigue, or   lassitude.  HEENT:   No headaches,  Difficulty swallowing,  Tooth/dental problems, or  Sore throat,                No sneezing, itching, ear ache, nasal congestion, post nasal drip, +ear pressure      Physical Exam  BP 122/80 (BP Location: Left Arm, Cuff Size: Normal)   Pulse 66   Ht 6' (1.829 m)   Wt 198 lb 9.6 oz (90.1 kg)   SpO2 96%   BMI 26.94 kg/m   GEN: A/Ox3; pleasant , NAD, elderly    HEENT:  [place]/AT,  EACs-Left cerumen impaction . Right is clear TMs-wnl on right NOSE-clear, THROAT-clear, no lesions, no postnasal drip or exudate noted.     Assessment & Plan:   Cerumen impaction Ear irrigation without apparent complication.  Plan  Use Debrox As needed        Rexene Edison, NP 01/05/2018

## 2018-01-05 NOTE — Patient Instructions (Signed)
Use Debrox to ears As needed  Ear wax  Follow up with Dr. Melvyn Novas  As planned and As needed

## 2018-01-05 NOTE — Assessment & Plan Note (Signed)
Ear irrigation without apparent complication.  Plan  Use Debrox As needed

## 2018-01-05 NOTE — Progress Notes (Signed)
Chart and office note reviewed in detail  > agree with a/p as outlined    

## 2018-03-25 ENCOUNTER — Ambulatory Visit (INDEPENDENT_AMBULATORY_CARE_PROVIDER_SITE_OTHER): Payer: Medicare Other | Admitting: Internal Medicine

## 2018-03-25 ENCOUNTER — Ambulatory Visit (INDEPENDENT_AMBULATORY_CARE_PROVIDER_SITE_OTHER)
Admission: RE | Admit: 2018-03-25 | Discharge: 2018-03-25 | Disposition: A | Discharge status: Home / Self Care | Payer: Medicare Other | Source: Ambulatory Visit | Attending: Internal Medicine | Admitting: Internal Medicine

## 2018-03-25 ENCOUNTER — Encounter: Payer: Self-pay | Admitting: Internal Medicine

## 2018-03-25 VITALS — BP 118/70 | HR 65 | Ht 72.0 in | Wt 201.0 lb

## 2018-03-25 DIAGNOSIS — R058 Other specified cough: Secondary | ICD-10-CM

## 2018-03-25 DIAGNOSIS — R05 Cough: Secondary | ICD-10-CM | POA: Diagnosis not present

## 2018-03-25 DIAGNOSIS — N402 Nodular prostate without lower urinary tract symptoms: Secondary | ICD-10-CM | POA: Diagnosis not present

## 2018-03-25 DIAGNOSIS — Z0000 Encounter for general adult medical examination without abnormal findings: Secondary | ICD-10-CM

## 2018-03-25 DIAGNOSIS — Z23 Encounter for immunization: Secondary | ICD-10-CM | POA: Diagnosis not present

## 2018-03-25 DIAGNOSIS — E7800 Pure hypercholesterolemia, unspecified: Secondary | ICD-10-CM

## 2018-03-25 DIAGNOSIS — I10 Essential (primary) hypertension: Secondary | ICD-10-CM | POA: Diagnosis not present

## 2018-03-25 NOTE — Progress Notes (Signed)
Subjective:  Patient ID: Cory Vasquez, male   DOB: 05-05-1946 .   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     History of Present Illness  November 02, 2008 ov cough after doubled lisinopril assoc with intense sore throat withouth sign nasal symptoms, sob, excess mucus or purulent sputum. No itching / sneezing. rec d/c acei, resolved         03/09/2017  f/u ov/Dalma Panchal re:  Hbp/ palpitations  Chief Complaint  Patient presents with  . Follow-up    Here for physical exam. Pt has been doing good since last visit. No complaints or concerns. Denies any SOB, cough, or CP.  palpitations typically last up to 1.5 h and better if supine  = SVT, [name] said consider ablation last ov  03/05/16 reviewed with pt / has not had colonoscopy yet as rec rec Schedule colonscopy at your convenience by the end of the year  Try the over the counter ear wax remover and if not better see Tammy NP for lavage of your left ear  Please remember to go to the lab and x-ray department downstairs in the basement  for your tests - we will call you with the results when they are available.    03/25/2018  f/u ov/Emilygrace Grothe re: annual hbp/hyperlipidemia / new cough x 6 months Chief Complaint  Patient presents with  . Annual Exam    Pt not fasting. Pt states doing well and no co's.    Dyspnea:  Not limited by breathing from desired activities   Cough: [place] no pattern x not typically noct / non productive throat tickle Sleeping: fine/ no am flare  SABA use: none 02: none    No obvious day to day or daytime variability or assoc excess/ purulent sputum or mucus plugs or hemoptysis or cp or chest tightness, subjective wheeze or overt sinus or hb symptoms.   Sleeping flat without nocturnal  or early am exacerbation  of respiratory  c/o's or need for noct saba. Also  denies any obvious fluctuation of symptoms with weather or environmental changes or other aggravating or alleviating factors except as outlined above   No unusual exposure hx or h/o childhood pna/ asthma or knowledge of premature birth.  Current Allergies, Complete Past Medical History, Past Surgical History, Family History, and Social History were reviewed in [employer] record.  ROS  The following are not active complaints unless bolded Hoarseness, sore throat, dysphagia, dental problems, itching, sneezing,  nasal congestion or discharge of excess mucus or purulent secretions, ear ache,   fever, chills, sweats, unintended wt loss or wt gain, classically pleuritic or exertional cp,  orthopnea pnd or arm/hand swelling  or leg swelling, presyncope, palpitations, abdominal pain, anorexia, nausea, vomiting, diarrhea  or change in bowel habits or change in bladder habits, change in stools or change in urine, dysuria, hematuria,  rash, arthralgias, visual complaints, headache, numbness, weakness or ataxia or problems with walking or coordination,  change in mood or  memory.        No outpatient medications have been marked as taking for the 03/25/18 encounter (Office Visit) with Tanda Rockers, MD.              Past Medical History:   HBP  - ACE d/c'd 11/02/08 cough/sorethroat > resolved  Palpitations  - neg myoview 05/2004  with EF 58%  Divertiuculosis  - see colonoscopy 11/2004 rec fu 11/2014 and 03/09/2017 and 03/25/2018  GU...................................................................... 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 Nl external ear canals without cough reflex   NECK :  without JVD/Nodes/TM/ nl carotid upstrokes bilaterally   LUNGS: no acc muscle use,  Nl contour chest which is clear to A and P bilaterally without cough on insp or exp maneuvers   CV:  RRR  no s3 or murmur or increase in P2, and no edema   ABD:  soft and nontender with nl inspiratory excursion in the supine position. No bruits or organomegaly appreciated, bowel sounds nl  MS:  Nl gait/ ext warm without deformities, calf tenderness, cyanosis or clubbing No obvious joint restrictions   SKIN: warm and dry without lesions    NEURO:  alert, approp, nl sensorium with  no motor or cerebellar deficits apparent.   GU/rectal per Dr Jeffie Pollock        Labs ordered 03/25/2018  Allergy profile   EKG :   Sinus Bradycardia/  Otherwise wnl     Assessment:

## 2018-03-25 NOTE — Patient Instructions (Signed)
GERD (REFLUX)  is an extremely common cause of respiratory symptoms just like yours , many times with no obvious heartburn at all.    It can be treated with medication, but also with lifestyle changes including elevation of the head of your bed (ideally with 6 inch  bed blocks),  Smoking cessation, avoidance of late meals, excessive alcohol, and avoid fatty foods, chocolate, peppermint, colas, red wine, and acidic juices such as orange juice.  NO MINT OR MENTHOL PRODUCTS SO NO COUGH DROPS   USE SUGARLESS CANDY INSTEAD (Jolley ranchers or Stover's or Life Savers) or even ice chips will also do - the key is to swallow to prevent all throat clearing. NO OIL BASED VITAMINS - use powdered substitutes.    If not better with diet you can try : Try prilosec otc 20mg   Take 30-60 min before first meal of the day and Pepcid ac (famotidine) 20 mg one @  bedtime until cough is completely gone for at least a week without the need for cough suppression   If not better in a month then return to start the work up for chronic cough.   Please remember to go to the lab and x-ray department downstairs in the basement  for your tests - we will call you with the results when they are available.

## 2018-03-28 ENCOUNTER — Encounter: Payer: Self-pay | Admitting: Internal Medicine

## 2018-03-28 NOTE — Assessment & Plan Note (Signed)
-   Target LDL < 130 as male, hbp, pos fm hx   rec fasting lipid profile

## 2018-03-28 NOTE — Assessment & Plan Note (Signed)
Needs colonoscopy undated/ advised

## 2018-03-28 NOTE — Assessment & Plan Note (Addendum)
Intol to ACEi 2010 Recurrent upper airway cough winter of 2019    Allergy profile 03/25/2018 >  Eos 0. /  IgE  Ordered, rec otc antihisatmines     Upper airway cough syndrome (previously labeled PNDS),  is so named because it's frequently impossible to sort out how much is  CR/sinusitis with freq throat clearing (which can be related to primary GERD)   vs  causing  secondary (" extra esophageal")  GERD from wide swings in gastric pressure that occur with throat clearing, often  promoting self use of mint and menthol lozenges that reduce the lower esophageal sphincter tone and exacerbate the problem further in a cyclical fashion.   These are the same pts (now being labeled as having "irritable larynx syndrome" by some cough centers) who not infrequently have a history of having failed to tolerate ace inhibitors,  dry powder inhalers or biphosphonates or report having atypical/extraesophageal reflux symptoms that don't respond to standard doses of PPI  and are easily confused as having aecopd or asthma flares by even experienced allergists/ pulmonologists (myself included).    Advised if not better with otc's (allegra, clariton or zyrtec) call for further eval

## 2018-03-28 NOTE — Assessment & Plan Note (Signed)
Adequate control on present rx, reviewed in detail with pt > no change in rx needed     Needs bmet

## 2018-03-28 NOTE — Assessment & Plan Note (Addendum)
referred to urology 03/06/2013 > dr Jeffie Pollock f/u s any obstructive symptoms or clinical evidence of recurrence

## 2018-03-29 ENCOUNTER — Other Ambulatory Visit (INDEPENDENT_AMBULATORY_CARE_PROVIDER_SITE_OTHER): Payer: Medicare Other

## 2018-03-29 ENCOUNTER — Encounter: Payer: Self-pay | Admitting: Gastroenterology

## 2018-03-29 DIAGNOSIS — I10 Essential (primary) hypertension: Secondary | ICD-10-CM

## 2018-03-29 DIAGNOSIS — E7800 Pure hypercholesterolemia, unspecified: Secondary | ICD-10-CM

## 2018-03-29 DIAGNOSIS — R058 Other specified cough: Secondary | ICD-10-CM

## 2018-03-29 DIAGNOSIS — R05 Cough: Secondary | ICD-10-CM | POA: Diagnosis not present

## 2018-03-29 LAB — HEPATIC FUNCTION PANEL
ALT: 16 U/L (ref 0–53)
AST: 19 U/L (ref 0–37)
Albumin: 3.9 g/dL (ref 3.5–5.2)
Alkaline Phosphatase: 63 U/L (ref 39–117)
BILIRUBIN TOTAL: 0.7 mg/dL (ref 0.2–1.2)
Bilirubin, Direct: 0.1 mg/dL (ref 0.0–0.3)
Total Protein: 6.9 g/dL (ref 6.0–8.3)

## 2018-03-29 LAB — CBC WITH DIFFERENTIAL/PLATELET
BASOS PCT: 0.2 % (ref 0.0–3.0)
Basophils Absolute: 0 10*3/uL (ref 0.0–0.1)
EOS PCT: 3.7 % (ref 0.0–5.0)
Eosinophils Absolute: 0.2 10*3/uL (ref 0.0–0.7)
HCT: 44.1 % (ref 39.0–52.0)
HEMOGLOBIN: 15 g/dL (ref 13.0–17.0)
LYMPHS ABS: 1.9 10*3/uL (ref 0.7–4.0)
Lymphocytes Relative: 33.4 % (ref 12.0–46.0)
MCHC: 34.1 g/dL (ref 30.0–36.0)
MCV: 95.5 fl (ref 78.0–100.0)
MONO ABS: 0.5 10*3/uL (ref 0.1–1.0)
Monocytes Relative: 9.3 % (ref 3.0–12.0)
Neutro Abs: 3.1 10*3/uL (ref 1.4–7.7)
Neutrophils Relative %: 53.4 % (ref 43.0–77.0)
Platelets: 259 10*3/uL (ref 150.0–400.0)
RBC: 4.61 Mil/uL (ref 4.22–5.81)
RDW: 12.9 % (ref 11.5–15.5)
WBC: 5.8 10*3/uL (ref 4.0–10.5)

## 2018-03-29 LAB — LIPID PANEL
CHOLESTEROL: 177 mg/dL (ref 0–200)
HDL: 49.2 mg/dL (ref >39.00)
LDL CALC: 105 mg/dL — AB (ref 0–99)
NONHDL: 127.84
Total CHOL/HDL Ratio: 4
Triglycerides: 115 mg/dL (ref 0.0–149.0)
VLDL: 23 mg/dL (ref 0.0–40.0)

## 2018-03-29 LAB — BASIC METABOLIC PANEL
BUN: 19 mg/dL (ref 6–23)
CHLORIDE: 104 meq/L (ref 96–112)
CO2: 25 meq/L (ref 19–32)
Calcium: 9.1 mg/dL (ref 8.4–10.5)
Creatinine, Ser: 1.08 mg/dL (ref 0.40–1.50)
GFR: 71.63 mL/min (ref >60.00)
Glucose, Bld: 100 mg/dL — ABNORMAL HIGH (ref 70–99)
POTASSIUM: 3.9 meq/L (ref 3.5–5.1)
SODIUM: 139 meq/L (ref 135–145)

## 2018-03-29 LAB — TSH: TSH: 0.95 u[IU]/mL (ref 0.35–4.50)

## 2018-03-29 NOTE — Progress Notes (Signed)
Spoke with pt and notified of results per Dr. [name]. Pt verbalized understanding and denied any questions. 

## 2018-03-30 LAB — RESPIRATORY ALLERGY PROFILE REGION II ~~LOC~~
Allergen, A. alternata, m6: <0.1 kU/L
Allergen, Comm Silver Birch, t9: <0.1 kU/L
Allergen, D pternoyssinus,d7: <0.1 kU/L
Allergen, P. notatum, m1: <0.1 kU/L
Bermuda Grass: <0.1 kU/L
Box Elder IgE: <0.1 kU/L
CLASS: 0
CLASS: 0
CLASS: 0
CLASS: 0
CLASS: 0
CLASS: 0
CLASS: 0
CLASS: 0
CLASS: 0
COMMON RAGWEED (SHORT) (W1) IGE: <0.1 kU/L
Cat Dander: <0.1 kU/L
Class: 0
Class: 0
Class: 0
Class: 0
Class: 0
Class: 0
Class: 0
Class: 0
Class: 0
Class: 0
Class: 0
Class: 0
Class: 0
Class: 0
Class: 0
D. farinae: <0.1 kU/L
Elm IgE: <0.1 kU/L
IgE (Immunoglobulin E), Serum: 45 kU/L (ref <=114)
Johnson Grass: <0.1 kU/L
Pecan/Hickory Tree IgE: <0.1 kU/L

## 2018-03-30 LAB — INTERPRETATION:

## 2018-03-31 NOTE — Progress Notes (Signed)
Pt notified labs ok

## 2018-04-11 DIAGNOSIS — D1801 Hemangioma of skin and subcutaneous tissue: Secondary | ICD-10-CM | POA: Diagnosis not present

## 2018-04-11 DIAGNOSIS — L812 Freckles: Secondary | ICD-10-CM | POA: Diagnosis not present

## 2018-04-11 DIAGNOSIS — L821 Other seborrheic keratosis: Secondary | ICD-10-CM | POA: Diagnosis not present

## 2018-04-11 DIAGNOSIS — L570 Actinic keratosis: Secondary | ICD-10-CM | POA: Diagnosis not present

## 2018-04-11 DIAGNOSIS — Z85828 Personal history of other malignant neoplasm of skin: Secondary | ICD-10-CM | POA: Diagnosis not present

## 2018-05-11 ENCOUNTER — Other Ambulatory Visit: Payer: Self-pay | Admitting: Internal Medicine

## 2018-05-17 ENCOUNTER — Encounter: Payer: Medicare Other | Admitting: Gastroenterology

## 2018-05-19 ENCOUNTER — Encounter: Payer: Medicare Other | Admitting: Gastroenterology

## 2018-08-08 ENCOUNTER — Ambulatory Visit (INDEPENDENT_AMBULATORY_CARE_PROVIDER_SITE_OTHER): Payer: Medicare Other | Admitting: Internal Medicine

## 2018-08-08 ENCOUNTER — Encounter: Payer: Self-pay | Admitting: Internal Medicine

## 2018-08-08 VITALS — BP 152/88 | HR 56 | Ht 72.0 in | Wt 200.4 lb

## 2018-08-08 DIAGNOSIS — I10 Essential (primary) hypertension: Secondary | ICD-10-CM

## 2018-08-08 DIAGNOSIS — M10272 Drug-induced gout, left ankle and foot: Secondary | ICD-10-CM | POA: Diagnosis not present

## 2018-08-08 DIAGNOSIS — R05 Cough: Secondary | ICD-10-CM | POA: Diagnosis not present

## 2018-08-08 DIAGNOSIS — R058 Other specified cough: Secondary | ICD-10-CM

## 2018-08-08 DIAGNOSIS — M10279 Drug-induced gout, unspecified ankle and foot: Secondary | ICD-10-CM | POA: Insufficient documentation

## 2018-08-08 LAB — URIC ACID: URIC ACID, SERUM: 5.9 mg/dL (ref 4.0–7.8)

## 2018-08-08 LAB — BASIC METABOLIC PANEL
BUN: 16 mg/dL (ref 6–23)
CALCIUM: 9.9 mg/dL (ref 8.4–10.5)
CHLORIDE: 104 meq/L (ref 96–112)
CO2: 32 mEq/L (ref 19–32)
CREATININE: 1.01 mg/dL (ref 0.40–1.50)
GFR: 77.31 mL/min (ref >60.00)
Glucose, Bld: 89 mg/dL (ref 70–99)
Potassium: 5 mEq/L (ref 3.5–5.1)
Sodium: 140 mEq/L (ref 135–145)

## 2018-08-08 MED ORDER — AMLODIPINE BESYLATE 5 MG PO TABS: 5.0000 mg | ORAL_TABLET | Freq: Every day | ORAL | 11 refills | Status: DC

## 2018-08-08 MED ORDER — FUROSEMIDE 20 MG PO TABS: 20.0000 mg | ORAL_TABLET | Freq: Every day | ORAL | 11 refills | Status: DC

## 2018-08-08 NOTE — Patient Instructions (Addendum)
Stop losartan/hctz  Start lasix 20 mg / amlodipine 5 mg daily in am   GERD (REFLUX)  is an extremely common cause of respiratory symptoms just like yours , many times with no obvious heartburn at all.    It can be treated with medication, but also with lifestyle changes including elevation of the head of your bed (ideally with 6 inch  bed blocks),  Smoking cessation, avoidance of late meals, excessive alcohol, and avoid fatty foods, chocolate, peppermint, colas, red wine, and acidic juices such as orange juice.  NO MINT OR MENTHOL PRODUCTS SO NO COUGH DROPS   USE SUGARLESS CANDY INSTEAD (Jolley ranchers or Stover's or Life Savers) or even ice chips will also do - the key is to swallow to prevent all throat clearing. NO OIL BASED VITAMINS - use powdered substitutes.    If not better with diet you can try : Try prilosec otc 20mg   Take 30-60 min before first meal of the day and Pepcid ac (famotidine) 20 mg one @  bedtime until cough is completely gone for at least a week without the need for cough suppression   If not better in a month then return to start the work up for chronic cough   If all better return after labor for cpx

## 2018-08-08 NOTE — Progress Notes (Signed)
Spoke with pt and notified of results per Dr. [name]. Pt verbalized understanding and denied any questions. 

## 2018-08-08 NOTE — Progress Notes (Signed)
Subjective:  Patient ID: Cory Vasquez, male   DOB: [birth year] .   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    History of Present Illness  November 02, 2008 ov cough after doubled lisinopril assoc with intense sore throat withouth sign nasal symptoms, sob, excess mucus or purulent sputum. No itching / sneezing. rec d/c acei, resolved   03/09/2017  f/u ov/Cory Vasquez re:  Hbp/ Vasquez  Chief Complaint  Patient presents with  . Follow-up    Here for physical exam. Pt has been doing good since last visit. No complaints or concerns. Denies any SOB, cough, or CP.  Vasquez typically last up to 1.5 h and better if supine  = SVT, [name] said consider ablation last ov  03/05/16 reviewed with pt / has not had colonoscopy yet as rec rec Schedule colonscopy at your convenience by the end of the year  Try the over the counter ear wax remover and if not better see Cory Vasquez for lavage of your left ear  Please remember to go to the lab and x-ray department downstairs in the basement  for your tests - we will call you with the results when they are available.    03/25/2018  f/u ov/Cory Vasquez re: annual hbp/hyperlipidemia / new cough x 6 months Chief Complaint  Patient presents with  . Annual Exam    Pt not fasting. Pt states doing well and no co's.    Dyspnea:  Not limited by breathing from desired activities   Cough: since 6 [place] no pattern x not typically noct / non productive throat tickle Sleeping: fine/ no am flare  rec GERD  If not better with diet you can try : Try prilosec otc 20mg   Take 30-60 min before first meal of the day and Pepcid ac (famotidine) 20 mg one @  bedtime until cough is completely gone for at least a week without the need for cough suppression If not better in a month then return to start the work up for chronic cough. >>>  did not do    08/08/2018  f/u ov/Cory Vasquez re:  Chronic cough / hbp new L foot pain woke up with it 2 d prior to [name]  Patient presents with  . Acute Visit    increased BP x 2 days. He also c/o left foot swelling and pain x 2 days.   Dyspnea:  Not limited by breathing from desired activities  Including bike /treadmill Cough: dry > wet  Day > noct using cough drops, never took gerd rx "it's not that bad"  Sleeping: side/ 2 pillows  SABA use: none  02: none   No obvious day to day or daytime variability or assoc excess/ purulent sputum or mucus plugs or hemoptysis or cp or chest tightness, subjective wheeze or overt sinus or hb symptoms.   Sleeping without nocturnal  or early am exacerbation  of respiratory  c/o's or need for noct saba. Also denies any obvious fluctuation of symptoms with weather or environmental changes or other aggravating or alleviating factors except as outlined above   No unusual exposure hx or h/o childhood pna/ asthma or knowledge of premature birth.  Current Allergies, Complete Past Medical History, Past Surgical History, Family History, and Social History were reviewed in [employer] record.  ROS  The following are not active  complaints unless bolded Hoarseness, sore throat, dysphagia, dental problems, itching, sneezing,  nasal congestion or discharge of excess mucus or purulent secretions, ear ache,   fever, chills, sweats, unintended wt loss or wt gain, classically pleuritic or exertional cp,  orthopnea pnd or arm/hand swelling  or leg swelling, presyncope, Vasquez, abdominal pain, anorexia, nausea, vomiting, diarrhea  or change in bowel habits or change in bladder habits, change in stools or change in urine, dysuria, hematuria,  rash, arthralgias, visual complaints, headache, numbness, weakness or ataxia or problems with walking or coordination,  change in mood or  memory.        Current Meds  Medication Sig  . aspirin  (BAYER LOW STRENGTH) 81 MG EC tablet Take 81 mg by mouth daily.    Marland Kitchen losartan-hydrochlorothiazide (HYZAAR) 100-25 MG tablet TAKE 1 TABLET BY MOUTH EVERY DAY  . metoprolol tartrate (LOPRESSOR) 25 MG tablet TAKE 1 TABLET BY MOUTH TWICE DAILY  . Multiple Vitamin (MULTIVITAMIN WITH MINERALS) TABS tablet Take 1 tablet by mouth daily.  . sildenafil (REVATIO) 20 MG tablet Take 20 mg by mouth as needed.             Past Medical History:   HBP  - ACE d/c'd 11/02/08 cough/sorethroat > resolved  Vasquez  - neg myoview 05/2004 with EF 58%  Divertiuculosis  - see colonoscopy 11/2004 rec fu 11/2014 and 03/09/2017 and 03/25/2018  GU...................................................................... 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 Nl external ear canals without cough reflex   NECK :  without JVD/Nodes/TM/ nl carotid upstrokes bilaterally   LUNGS: no acc muscle use,  Nl contour chest which is clear to A and P bilaterally without cough on insp or exp maneuvers   CV:  RRR  no s3 or murmur or increase in P2, and no edema   ABD:  soft and nontender with nl inspiratory excursion in the supine position. No bruits or organomegaly  appreciated, bowel sounds nl  MS:  Nl gait/ ext warm without deformities, calf tenderness, cyanosis or clubbing No obvious joint restrictions  - Pos calor over 1st MTP on L   SKIN: warm and dry without lesions    NEURO:  alert, approp, nl sensorium with  no motor or cerebellar deficits apparent.       Labs ordered/ reviewed:      Chemistry      Component Value Date/Time   NA 140 08/08/2018 1002   K 5.0 08/08/2018 1002   CL 104 08/08/2018 1002   CO2 32 08/08/2018 1002  BUN 16 08/08/2018 1002   CREATININE 1.01 08/08/2018 1002      Component Value Date/Time   CALCIUM 9.9 08/08/2018 1002   ALKPHOS 63 03/29/2018 0829   AST 19 03/29/2018 0829   ALT 16 03/29/2018 0829   BILITOT 0.7 03/29/2018 0829         Uric Acid 08/08/2018  = 5.9             Assessment:

## 2018-08-12 ENCOUNTER — Encounter: Payer: Self-pay | Admitting: Internal Medicine

## 2018-08-12 NOTE — Assessment & Plan Note (Signed)
-   d/c hctz 08/08/2018 due to likely gout    Try amlopidine 5 mg daily and lasix 20 mg daily instead and recheck in one month

## 2018-08-12 NOTE — Assessment & Plan Note (Signed)
Intol to ACEi 2010 Recurrent upper airway cough winter of 2019  Allergy profile 03/25/2018 >  Eos 0.2 /  IgE 45  RAST neg   - 08/08/2018 rec gerd diet/ trial of acid suppression with otcs  The standardized cough guidelines published in Chest by Lissa Morales in 2006 are still the best available and consist of a multiple step process (up to 12!) , not a single office visit,  and are intended  to address this problem logically,  with an alogrithm dependent on response to empiric treatment at  each progressive step  to determine a specific diagnosis with  minimal addtional testing needed. Therefore if adherence is an issue or can't be accurately verified,  it's very unlikely the standard evaluation and treatment will be successful here.    Furthermore, response to therapy (other than acute cough suppression, which should only be used short term with avoidance of narcotic containing cough syrups if possible), can be a gradual process for which the patient is not likely to  perceive immediate benefit.  Unlike going to an eye doctor where the best perscription is almost always the first one and is immediately effective, this is almost never the case in the management of chronic cough syndromes. Therefore the patient needs to commit up front to consistently adhere to recommendations  for up to 6 weeks of therapy directed at the likely underlying problem(s) before the response can be reasonably evaluated.    I had an extended discussion with the patient reviewing all relevant studies completed to date and  lasting 15 to 20 minutes of a 25 minute visit    Each maintenance medication was reviewed in detail including most importantly the difference between maintenance and prns and under what circumstances the prns are to be triggered using an action plan format that is not reflected in the computer generated alphabetically organized AVS.     Please see AVS for specific instructions unique to this visit that I  personally wrote and verbalized to the the pt in detail and then reviewed with pt  by my nurse highlighting any  changes in therapy recommended at today's visit to their plan of care.

## 2018-08-12 NOTE — Assessment & Plan Note (Signed)
Onset 08/06/18 while on hctz and UA level 5.9 08/08/2018   Classic hx and exam so first step is try off hctz and f/u ortho or rheum prn   Gout diet also reviewed.

## 2018-08-15 ENCOUNTER — Ambulatory Visit (INDEPENDENT_AMBULATORY_CARE_PROVIDER_SITE_OTHER): Payer: Medicare Other | Admitting: Internal Medicine

## 2018-08-15 ENCOUNTER — Telehealth: Payer: Self-pay | Admitting: Internal Medicine

## 2018-08-15 ENCOUNTER — Encounter: Payer: Self-pay | Admitting: Internal Medicine

## 2018-08-15 VITALS — BP 124/80 | HR 67 | Ht 72.0 in | Wt 196.4 lb

## 2018-08-15 DIAGNOSIS — M10272 Drug-induced gout, left ankle and foot: Secondary | ICD-10-CM

## 2018-08-15 DIAGNOSIS — R05 Cough: Secondary | ICD-10-CM | POA: Diagnosis not present

## 2018-08-15 DIAGNOSIS — I10 Essential (primary) hypertension: Secondary | ICD-10-CM

## 2018-08-15 DIAGNOSIS — R058 Other specified cough: Secondary | ICD-10-CM

## 2018-08-15 MED ORDER — AMLODIPINE BESYLATE 10 MG PO TABS: 10.0000 mg | ORAL_TABLET | Freq: Every day | ORAL | 11 refills | Status: DC

## 2018-08-15 MED ORDER — AMLODIPINE BESYLATE 5 MG PO TABS: 5.0000 mg | ORAL_TABLET | Freq: Every day | ORAL | 11 refills | Status: DC

## 2018-08-15 NOTE — Patient Instructions (Signed)
Double the amlodipine 10 mg daily   Return after labor day as planned for cpx - call in meantime if need

## 2018-08-15 NOTE — Telephone Encounter (Signed)
Spoke with pt. At his last OV, MW changed his BP medications. States over the last week his BP has been running high. Pt has been consistently checking it at [facility]. It has been running in the range of 150/100. Pt has been scheduled to see MW today at 3:15pm.

## 2018-08-15 NOTE — Progress Notes (Signed)
Subjective:  Patient ID: Cory Vasquez, male   DOB: 04-10-1946 .   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    History of Present Illness  November 02, 2008 ov cough after doubled lisinopril assoc with intense sore throat withouth sign nasal symptoms, sob, excess mucus or purulent sputum. No itching / sneezing. rec d/c acei, resolved   03/09/2017  f/u ov/[name] re:  Hbp/ palpitations  Chief Complaint  Patient presents with  . Follow-up    Here for physical exam. Pt has been doing good since last visit. No complaints or concerns. Denies any SOB, cough, or CP.  palpitations typically last up to 1.5 h and better if supine  = SVT, [name] said consider ablation last ov  03/05/16 reviewed with pt / has not had colonoscopy yet as rec rec Schedule colonscopy at your convenience by the end of the year  Try the over the counter ear wax remover and if not better see Tammy NP for lavage of your left ear  Please remember to go to the lab and x-ray department downstairs in the basement  for your tests - we will call you with the results when they are available.    03/25/2018  f/u ov/[name] re: annual hbp/hyperlipidemia / new cough x 6 months Chief Complaint  Patient presents with  . Annual Exam    Pt not fasting. Pt states doing well and no co's.    Dyspnea:  Not limited by breathing from desired activities   Cough: [place] 6 months [place] at beach no pattern x not typically noct / non productive throat tickle Sleeping: fine/ no am flare  rec GERD  If not better with diet you can try : Try prilosec otc 20mg   Take 30-60 min before first meal of the day and Pepcid ac (famotidine) 20 mg one @  bedtime until cough is completely gone for at least a week without the need for cough suppression If not better in a month then return to start the work up for chronic cough. >>>  did not do    08/08/2018  f/u ov/[name] re:  Chronic cough / hbp new L foot pain woke up with it 2 d prior to [place]  Patient presents with  . Acute Visit    increased BP x 2 days. He also c/o left foot swelling and pain x 2 days.   Dyspnea:  Not limited by breathing from desired activities  Including bike /treadmill Cough: dry > wet  Day > noct using cough drops, never took gerd rx "it's not that bad"  Sleeping: side/ 2 pillows  SABA use: none  02: none Rec Stop losartan/hctz Start lasix 20 mg / amlodipine 5 mg daily in am  GERD diet   If not better with diet you can try : Try prilosec otc 20mg   Take 30-60 min before first meal of the day and Pepcid ac (famotidine) 20 mg one @  bedtime until cough is completely gone for at least a week without the need for cough suppression   08/15/2018  f/u ov/[name] re: hbp readings high/ foot better / never took gerd rx  Chief Complaint  Patient presents with  . Acute Visit    high blood pressure x1 week  since change from losartan-hctz to lasix /amlodipine bp readings as high as 145/100 per pt.  No  ha/ tia/claudication/cp  L great toe better, never needed nsaids   Continues with minimal daily cough s  obvious day to day or daytime variability or assoc excess/ purulent sputum or mucus plugs or hemoptysis or cp or chest tightness, subjective wheeze or overt sinus or hb symptoms.   Sleeping ok without nocturnal  or early am exacerbation  of respiratory  c/o's or need for noct saba. Also denies any obvious fluctuation of symptoms with weather or environmental changes or other aggravating or alleviating factors except as outlined above   No unusual exposure hx or h/o childhood pna/ asthma or knowledge of premature birth.  Current Allergies, Complete Past Medical History, Past Surgical History, Family History, and Social History were reviewed in [employer] record.  ROS  The following are not active complaints  unless bolded Hoarseness, sore throat, dysphagia, dental problems, itching, sneezing,  nasal congestion or discharge of excess mucus or purulent secretions, ear ache,   fever, chills, sweats, unintended wt loss or wt gain, classically pleuritic or exertional cp,  orthopnea pnd or arm/hand swelling  or leg swelling, presyncope, palpitations, abdominal pain, anorexia, nausea, vomiting, diarrhea  or change in bowel habits or change in bladder habits, change in stools or change in urine, dysuria, hematuria,  rash, arthralgias, visual complaints, headache, numbness, weakness or ataxia or problems with walking or coordination,  change in mood or  memory.        Current Meds  Medication Sig  . aspirin (BAYER LOW STRENGTH) 81 MG EC tablet Take 81 mg by mouth daily.    . furosemide (LASIX) 20 MG tablet Take 1 tablet (20 mg total) by mouth daily.  . metoprolol tartrate (LOPRESSOR) 25 MG tablet TAKE 1 TABLET BY MOUTH TWICE DAILY  . Multiple Vitamin (MULTIVITAMIN WITH MINERALS) TABS tablet Take 1 tablet by mouth daily.  . sildenafil (REVATIO) 20 MG tablet Take 20 mg by mouth as needed.  . [DISCONTINUED] amLODipine (NORVASC) 5 MG tablet Take 1 tablet (5 mg total) by mouth daily.  . [DISCONTINUED] amLODipine (NORVASC) 5 MG tablet Take 1 tablet (5 mg total) by mouth daily.          No obvious day to day or daytime variability or assoc excess/ purulent sputum or mucus plugs or hemoptysis or cp or chest tightness, subjective wheeze or overt sinus or hb symptoms.   Sleeping  without nocturnal  or early am exacerbation  of respiratory  c/o's or need for noct saba. Also denies any obvious fluctuation of symptoms with weather or environmental changes or other aggravating or alleviating factors except as outlined above   No unusual exposure hx or h/o childhood pna/ asthma or knowledge of premature birth.  Current Allergies, Complete Past Medical History, Past Surgical History, Family History, and Social History  were reviewed in [employer] record.  ROS  The following are not active complaints unless bolded Hoarseness, sore throat, dysphagia, dental problems, itching, sneezing,  nasal congestion or discharge of excess mucus or purulent secretions, ear ache,   fever, chills, sweats, unintended wt loss or wt gain, classically pleuritic or exertional cp,  orthopnea pnd or arm/hand swelling  or leg swelling, presyncope, palpitations, abdominal pain, anorexia, nausea, vomiting, diarrhea  or change in bowel habits or change in bladder habits, change in stools or change in urine, dysuria, hematuria,  rash, arthralgias, visual complaints, headache, numbness, weakness or ataxia or problems with walking or coordination,  change in mood or  memory.  Current Meds  Medication Sig  . aspirin (BAYER LOW STRENGTH) 81 MG EC tablet Take 81 mg by mouth daily.    . furosemide (LASIX) 20 MG tablet Take 1 tablet (20 mg total) by mouth daily.  . metoprolol tartrate (LOPRESSOR) 25 MG tablet TAKE 1 TABLET BY MOUTH TWICE DAILY  . Multiple Vitamin (MULTIVITAMIN WITH MINERALS) TABS tablet Take 1 tablet by mouth daily.  . sildenafil (REVATIO) 20 MG tablet Take 20 mg by mouth as needed.  . [DISCONTINUED] amLODipine (NORVASC) 5 MG tablet Take 1 tablet (5 mg total) by mouth daily.  .                     Past Medical History:   HBP  - ACE d/c'd 11/02/08 cough/sorethroat > resolved  - HCTZ d/c'd due to gout L great toe 07/2018 Palpitations  - neg myoview 05/2004 with EF 58%  Divertiuculosis  - see colonoscopy 11/2004 rec fu 11/2014 and 03/09/2017 and 03/25/2018  GU...................................................................... 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 Nl external ear canals without cough reflex   NECK :  without JVD/Nodes/TM/ nl carotid upstrokes bilaterally   LUNGS: no acc muscle use,  Nl contour chest which is clear to A and P bilaterally without cough on insp or exp maneuvers   CV:  RRR  no s3 or murmur or increase in P2, and no edema   ABD:  soft and nontender with nl inspiratory excursion in the supine position. No bruits or organomegaly appreciated, bowel sounds nl  MS:  Nl gait/ ext warm without deformities, calf tenderness, cyanosis or clubbing No obvious joint restrictions   SKIN: warm and dry without lesions    NEURO:  alert, approp, nl sensorium with  no motor or cerebellar deficits apparent.           Assessment:

## 2018-08-16 ENCOUNTER — Encounter: Payer: Self-pay | Admitting: Internal Medicine

## 2018-08-16 NOTE — Assessment & Plan Note (Addendum)
Onset 08/06/18 while on hctz and UA level 5.9 08/08/2018 > much better 08/15/2018 s rx   No additional rx needed unless pain recurs off hctz/ gout diet reviewed

## 2018-08-16 NOTE — Assessment & Plan Note (Signed)
-   d/c hctz 08/08/2018 due to likely gout > improved off hctz as of 08/15/2018   However, Not optimally controlled on present regimen. I reviewed this with the patient and note that adequately beta blocked so rec double dose of amlopidine with usual side effects = constipation and leg swelling   Discussed in detail all the  indications, usual  risks and alternatives  relative to the benefits with patient who agrees to proceed with rx as outlined.

## 2018-08-16 NOTE — Assessment & Plan Note (Addendum)
Intol to ACEi 2010 Recurrent upper airway cough winter of 2019  Allergy profile 03/25/2018 >  Eos 0.2 /  IgE 45  RAST neg  - 08/08/2018 rec gerd diet/ trial of acid suppression with otcs  Says not it's not bad enough to want to start on cough algorithm    I had an extended discussion with the patient reviewing all relevant studies completed to date and  lasting 15 to 20 minutes of a 25 minute acute office visit    Each maintenance medication was reviewed in detail including most importantly the difference between maintenance and prns and under what circumstances the prns are to be triggered using an action plan format that is not reflected in the computer generated alphabetically organized AVS.     Please see AVS for specific instructions unique to this visit that I personally wrote and verbalized to the the pt in detail and then reviewed with pt  by my nurse highlighting any  changes in therapy recommended at today's visit to their plan of care.

## 2018-09-27 DIAGNOSIS — L570 Actinic keratosis: Secondary | ICD-10-CM | POA: Diagnosis not present

## 2018-09-27 DIAGNOSIS — L578 Other skin changes due to chronic exposure to nonionizing radiation: Secondary | ICD-10-CM | POA: Diagnosis not present

## 2018-09-27 DIAGNOSIS — Z85828 Personal history of other malignant neoplasm of skin: Secondary | ICD-10-CM | POA: Diagnosis not present

## 2018-09-27 DIAGNOSIS — L820 Inflamed seborrheic keratosis: Secondary | ICD-10-CM | POA: Diagnosis not present

## 2018-10-20 ENCOUNTER — Telehealth: Payer: Self-pay | Admitting: Internal Medicine

## 2018-10-20 NOTE — Telephone Encounter (Signed)
Called [facility] and spoke with Ovid Curd in regards to pt's visits of 1/13 and 1/20 that they were needing to know if these were primary care visits or specialist visits and I stated that we have Dr. Melvyn Novas listed as pt's PCP. Per Ovid Curd, these were showing up as specialty visits and I then stated to Ovid Curd that MW is a pulmonologist working at a specialty office but he also still sees some PCP patients, one being this pt.  Per Ovid Curd, pt would have to call insurance to get MW switched to PCP if this is still correct so they could get the info fixed with claims.   Called and spoke with pt letting him know that he is needing to call insurance to tell them that MW is his PCP so that way they can get things fixed with claims. Pt expressed understanding and stated he would call them. Nothing further needed.

## 2018-12-31 ENCOUNTER — Other Ambulatory Visit: Payer: Self-pay | Admitting: Internal Medicine

## 2019-01-11 LAB — PSA: PSA: 1.01

## 2019-01-16 ENCOUNTER — Telehealth: Payer: Self-pay | Admitting: Internal Medicine

## 2019-01-16 DIAGNOSIS — C44329 Squamous cell carcinoma of skin of other parts of face: Secondary | ICD-10-CM | POA: Diagnosis not present

## 2019-01-16 DIAGNOSIS — L570 Actinic keratosis: Secondary | ICD-10-CM | POA: Diagnosis not present

## 2019-01-16 NOTE — Telephone Encounter (Signed)
Called and spoke with pt who stated he has had swelling in feet and ankles that comes and goes. Pt states that this might be gout as he had gout in toes about 6 months ago and meds were changed by MW.  Pt stated he does not have the pain like he had 6 months ago from the gout.  Pt also states he has had some violent hiccups which he had last night 6/21 which had some dry heaving involved and had to sit up in bed to be able to get hiccups under control.  Due to recent changes, pt stated he would like to have an appt with MW. Pt denies any complaints of fever, SOB, chest tightness, body aches, or chills. Pt has not done any recent travelling, has not had to be tested for COVID for any reason, and has not been around anyone that has either been sick or exposed to [place].  Pt has been scheduled for appt with MW tomorrow, 6/23 at 12pm. Nothing further needed.

## 2019-01-17 ENCOUNTER — Encounter: Payer: Self-pay | Admitting: Internal Medicine

## 2019-01-17 ENCOUNTER — Ambulatory Visit (INDEPENDENT_AMBULATORY_CARE_PROVIDER_SITE_OTHER): Payer: Medicare Other | Admitting: Internal Medicine

## 2019-01-17 ENCOUNTER — Other Ambulatory Visit: Payer: Self-pay

## 2019-01-17 DIAGNOSIS — M10279 Drug-induced gout, unspecified ankle and foot: Secondary | ICD-10-CM | POA: Diagnosis not present

## 2019-01-17 DIAGNOSIS — R05 Cough: Secondary | ICD-10-CM

## 2019-01-17 DIAGNOSIS — I10 Essential (primary) hypertension: Secondary | ICD-10-CM | POA: Diagnosis not present

## 2019-01-17 DIAGNOSIS — R058 Other specified cough: Secondary | ICD-10-CM

## 2019-01-17 NOTE — Progress Notes (Signed)
Subjective:  Patient ID: Cory Vasquez, male   DOB: 1945/12/11 .   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    History of Present Illness  November 02, 2008 ov cough after doubled lisinopril assoc with intense sore throat withouth sign nasal symptoms, sob, excess mucus or purulent sputum. No itching / sneezing. rec d/c acei, resolved   03/09/2017  f/u ov/Lacosta Hargan re:  Hbp/ palpitations  Chief Complaint  Patient presents with  . Follow-up    Here for physical exam. Pt has been doing good since last visit. No complaints or concerns. Denies any SOB, cough, or CP.  palpitations typically last up to 1.5 h and better if supine  = SVT, [name] said consider ablation last ov  03/05/16 reviewed with pt / has not had colonoscopy yet as rec rec Schedule colonscopy at your convenience by the end of the year  Try the over the counter ear wax remover and if not better see Tammy NP for lavage of your left ear  Please remember to go to the lab and x-ray department downstairs in the basement  for your tests - we will call you with the results when they are available.    03/25/2018  f/u ov/Laurent Cargile re: annual hbp/hyperlipidemia / new cough x 6 months Chief Complaint  Patient presents with  . Annual Exam    Pt not fasting. Pt states doing well and no co's.    Dyspnea:  Not limited by breathing from desired activities   Cough: since 6 months [place] at beach no pattern x not typically noct / non productive throat tickle Sleeping: fine/ no am flare  rec GERD  If not better with diet you can try : Try prilosec otc 20mg   Take 30-60 min before first meal of the day and Pepcid ac (famotidine) 20 mg one @  bedtime until cough is completely gone for at least a week without the need for cough suppression If not better in a month then return to start the work up for chronic cough. >>>  did not do    08/08/2018  f/u ov/Desia Saban re:  Chronic cough / hbp new L foot pain woke up with it 2 d prior to [place]  Patient presents with  . Acute Visit    increased BP x 2 days. He also c/o left foot swelling and pain x 2 days.   Dyspnea:  Not limited by breathing from desired activities  Including bike /treadmill Cough: dry > wet  Day > noct using cough drops, never took gerd rx "it's not that bad"  Sleeping: side/ 2 pillows  SABA use: none  02: none Rec Stop losartan/hctz Start lasix 20 mg / amlodipine 5 mg daily in am  GERD diet   If not better with diet you can try : Try prilosec otc 20mg   Take 30-60 min before first meal of the day and Pepcid ac (famotidine) 20 mg one @  bedtime until cough is completely gone for at least a week without the need for cough suppression       01/17/2019  f/u ov/Vicky Schleich re: hbp/ leg swelling/ severe hiccups Chief Complaint  Patient presents with  . Acute Visit    occ swelling in feet ankles- esp the right   Dyspnea:  Not limited by breathing from desired activities   Cough: none Sleeping: flat  Severe hiccups assoc with overt hb happening no more than once a week, never took gerd rx as prev rec for cough    No obvious day to day or daytime variability or assoc excess/ purulent sputum or mucus plugs or hemoptysis or cp or chest tightness, subjective wheeze or overt sinus or hb symptoms.   sleeping without nocturnal  or early am exacerbation  of respiratory  c/o's or need for noct saba. Also denies any obvious fluctuation of symptoms with weather or environmental changes or other aggravating or alleviating factors except as outlined above   No unusual exposure hx or h/o childhood pna/ asthma or knowledge of premature birth.  Current Allergies, Complete Past Medical History, Past Surgical History, Family History, and Social History were reviewed in [employer] record.  ROS  The following are not active  complaints unless bolded Hoarseness, sore throat, dysphagia, dental problems, itching, sneezing,  nasal congestion or discharge of excess mucus or purulent secretions, ear ache,   fever, chills, sweats, unintended wt loss or wt gain, classically pleuritic or exertional cp,  orthopnea pnd or arm/hand swelling  or leg swelling, presyncope, palpitations, abdominal pain, anorexia, nausea, vomiting, diarrhea  or change in bowel habits or change in bladder habits, change in stools or change in urine, dysuria, hematuria,  rash, arthralgias, visual complaints, headache, numbness, weakness or ataxia or problems with walking or coordination,  change in mood or  memory.        Current Meds  Medication Sig  . amLODipine (NORVASC) 10 MG tablet Take 1 tablet (10 mg total) by mouth daily.  Marland Kitchen aspirin (BAYER LOW STRENGTH) 81 MG EC tablet Take 81 mg by mouth daily.    . furosemide (LASIX) 20 MG tablet Take 1 tablet (20 mg total) by mouth daily.  . metoprolol tartrate (LOPRESSOR) 25 MG tablet TAKE 1 TABLET BY MOUTH TWICE DAILY  . Multiple Vitamin (MULTIVITAMIN WITH MINERALS) TABS tablet Take 1 tablet by mouth daily.  . sildenafil (REVATIO) 20 MG tablet Take 20 mg by mouth as needed.                 Past Medical History:   HBP  - ACE d/c'd 11/02/08 cough/sorethroat > resolved  - HCTZ d/c'd due to gout L great toe 07/2018 Palpitations  - neg myoview 05/2004 with EF 58%  Divertiuculosis  - see colonoscopy 11/2004 rec fu 11/2014 and 03/09/2017 and 03/25/2018  GU...................................................................... 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 Nl external ear canals without cough reflex   NECK :  without JVD/Nodes/TM/ nl carotid upstrokes bilaterally   LUNGS: no acc muscle use,  Nl contour chest which is clear to A and P bilaterally without cough on insp or exp maneuvers   CV:  RRR  no s3 or murmur or increase in P2, and tace ankle edema bilaterally   ABD:  soft and nontender with nl inspiratory excursion in the supine position. No bruits or organomegaly appreciated, bowel sounds nl  MS:  Nl gait/ ext warm without deformities, calf tenderness, cyanosis or clubbing No obvious joint restrictions   SKIN: warm and dry without lesions    NEURO:  alert, approp, nl sensorium with  no motor or cerebellar deficits apparent.       Assessment:

## 2019-01-17 NOTE — Patient Instructions (Addendum)
For any flare of cough, hiccups, urge to clear the throat > immediately start prilosec 20 mg Take 30- 60 min before your first and last meals of the day   For swelling in legs, ok to take an extra furosemide and avoid salt, elevate and use elastic knee high stockings   Set up cpx after labor day

## 2019-01-21 ENCOUNTER — Encounter: Payer: Self-pay | Admitting: Internal Medicine

## 2019-01-21 NOTE — Assessment & Plan Note (Signed)
Intol to ACEi 2010 Recurrent upper airway cough winter of 2019  Allergy profile 03/25/2018 >  Eos 0.2 /  IgE 45  RAST neg  - 08/08/2018 rec gerd diet/ trial of acid suppression with otcs  Suspect the hiccups are another manifestation of gerd and rec gerd rx for next flare/ gi f/u prn

## 2019-01-21 NOTE — Assessment & Plan Note (Signed)
Onset 08/06/18 while on hctz and UA level 5.9 08/08/2018 > much better 08/15/2018 s rx > resolved as of 01/17/2019    Each maintenance medication was reviewed in detail including most importantly the difference between maintenance and as needed and under what circumstances the prns are to be used.  Please see AVS for specific  Instructions which are unique to this visit and I personally typed out  which were reviewed in detail in writing with the patient and a copy provided.

## 2019-01-21 NOTE — Assessment & Plan Note (Signed)
-   d/c hctz 08/08/2018 due to likely gout > improved off hctz as of 08/15/2018   - worse dep edema reported 01/17/2019 on amlopine 10 and lasix rec elastic hose, extra dose of lasix prn - if persists consider lower dose of amlodipine in combination with  losartan s hctz due to concerns with gout.

## 2019-02-02 DIAGNOSIS — C44329 Squamous cell carcinoma of skin of other parts of face: Secondary | ICD-10-CM | POA: Diagnosis not present

## 2019-02-17 DIAGNOSIS — Z1211 Encounter for screening for malignant neoplasm of colon: Secondary | ICD-10-CM | POA: Diagnosis not present

## 2019-02-20 ENCOUNTER — Telehealth: Payer: Self-pay | Admitting: Internal Medicine

## 2019-02-20 DIAGNOSIS — I10 Essential (primary) hypertension: Secondary | ICD-10-CM

## 2019-02-20 NOTE — Telephone Encounter (Signed)
Spoke with pt he would like a referral to Dr. Ronnald Ramp at [facility]. Dr. Melvyn Novas please advise if it is ok to send referral.

## 2019-02-20 NOTE — Telephone Encounter (Signed)
Pt is calling back [phone]

## 2019-02-20 NOTE — Telephone Encounter (Signed)
LMTCB

## 2019-02-21 NOTE — Telephone Encounter (Signed)
Patient would like to establish with Dr. Ronnald Ramp b/c pt's spouse is a pt of Dr. Ronnald Ramp.  Please advise.

## 2019-02-21 NOTE — Telephone Encounter (Signed)
Fine with me  Dx essential hbp

## 2019-02-21 NOTE — Telephone Encounter (Signed)
Order has been placed for the referral to primary care. Called and spoke with pt letting him know this had been done. Pt verbalized understanding. Nothing further needed.

## 2019-02-22 ENCOUNTER — Other Ambulatory Visit: Payer: Self-pay | Admitting: Internal Medicine

## 2019-02-22 MED ORDER — METOPROLOL TARTRATE 25 MG PO TABS: 25.0000 mg | ORAL_TABLET | Freq: Two times a day (BID) | ORAL | 1 refills | Status: DC

## 2019-02-22 NOTE — Telephone Encounter (Signed)
Pt scheduled  

## 2019-02-22 NOTE — Telephone Encounter (Signed)
Yes, I will see him  TJ 

## 2019-03-28 ENCOUNTER — Ambulatory Visit (INDEPENDENT_AMBULATORY_CARE_PROVIDER_SITE_OTHER): Payer: Medicare Other | Admitting: Internal Medicine

## 2019-03-28 ENCOUNTER — Encounter: Payer: Self-pay | Admitting: Internal Medicine

## 2019-03-28 ENCOUNTER — Other Ambulatory Visit: Payer: Self-pay

## 2019-03-28 VITALS — BP 126/70 | HR 72 | Temp 98.2°F | Resp 16 | Ht 72.0 in | Wt 197.0 lb

## 2019-03-28 DIAGNOSIS — Z0000 Encounter for general adult medical examination without abnormal findings: Secondary | ICD-10-CM

## 2019-03-28 DIAGNOSIS — I10 Essential (primary) hypertension: Secondary | ICD-10-CM | POA: Diagnosis not present

## 2019-03-28 DIAGNOSIS — E785 Hyperlipidemia, unspecified: Secondary | ICD-10-CM

## 2019-03-28 DIAGNOSIS — Z23 Encounter for immunization: Secondary | ICD-10-CM | POA: Diagnosis not present

## 2019-03-28 DIAGNOSIS — R739 Hyperglycemia, unspecified: Secondary | ICD-10-CM

## 2019-03-28 DIAGNOSIS — Z0001 Encounter for general adult medical examination with abnormal findings: Secondary | ICD-10-CM | POA: Diagnosis not present

## 2019-03-28 DIAGNOSIS — R001 Bradycardia, unspecified: Secondary | ICD-10-CM | POA: Diagnosis not present

## 2019-03-28 DIAGNOSIS — M10279 Drug-induced gout, unspecified ankle and foot: Secondary | ICD-10-CM

## 2019-03-28 DIAGNOSIS — N402 Nodular prostate without lower urinary tract symptoms: Secondary | ICD-10-CM

## 2019-03-28 DIAGNOSIS — Z1211 Encounter for screening for malignant neoplasm of colon: Secondary | ICD-10-CM

## 2019-03-28 MED ORDER — ROSUVASTATIN CALCIUM 10 MG PO TABS: 10.0000 mg | ORAL_TABLET | Freq: Every day | ORAL | 1 refills | Status: DC

## 2019-03-28 NOTE — Progress Notes (Signed)
Subjective:  Patient ID: Cory Vasquez, male    DOB: 1945-12-25  Age: 73 y.o. MRN: [id number]  CC: Hypertension and Hyperlipidemia  NEW TO ME  HPI SHAFT TIA presents for f/up - He has felt well recently and offers no complaints.  He exercises and clocks in about 15,000 steps a day.  He does not experience CP, DOE, palpitations, edema, or fatigue.  He tells me his blood pressure has been well controlled.  History Coal has a past medical history of Diverticulosis, HBP (high blood pressure), Health maintenance examination, Palpitations, and Prostate cancer ([place]).   He has a past surgical history that includes Back surgery.   His family history includes Diabetes in his brother; Heart disease (age of onset: 54) in his father.He reports that he has never smoked. He has never used smokeless tobacco. He reports current alcohol use of about 5.0 - 6.0 standard drinks of alcohol per week. He reports that he does not use drugs.  Outpatient Medications Prior to Visit  Medication Sig Dispense Refill  . amLODipine (NORVASC) 10 MG tablet Take 1 tablet (10 mg total) by mouth daily. 30 tablet 11  . aspirin (BAYER LOW STRENGTH) 81 MG EC tablet Take 81 mg by mouth daily.      . furosemide (LASIX) 20 MG tablet Take 1 tablet (20 mg total) by mouth daily. 30 tablet 11  . metoprolol tartrate (LOPRESSOR) 25 MG tablet Take 1 tablet (25 mg total) by mouth 2 (two) times daily. 180 tablet 1  . Multiple Vitamin (MULTIVITAMIN WITH MINERALS) TABS tablet Take 1 tablet by mouth daily.    . sildenafil (REVATIO) 20 MG tablet Take 20 mg by mouth as needed.     No facility-administered medications prior to visit.     ROS Review of Systems  Constitutional: Negative for chills, diaphoresis, fatigue and unexpected weight change.  HENT: Negative.   Eyes: Negative for visual disturbance.  Respiratory: Negative for cough, chest tightness, shortness of breath and wheezing.   Cardiovascular: Negative for chest  pain, palpitations and leg swelling.  Gastrointestinal: Negative for abdominal pain, diarrhea, nausea and vomiting.  Endocrine: Negative.  Negative for polydipsia, polyphagia and polyuria.  Genitourinary: Negative.  Negative for difficulty urinating and dysuria.  Musculoskeletal: Negative.  Negative for arthralgias and myalgias.  Skin: Negative.   Neurological: Negative for dizziness, weakness and light-headedness.  Hematological: Negative for adenopathy. Does not bruise/bleed easily.  Psychiatric/Behavioral: Negative.     Objective:  BP 126/70 (BP Location: Left Arm, Patient Position: Sitting, Cuff Size: Normal)   Pulse 72   Temp 98.2 F (36.8 C) (Oral)   Resp 16   Ht 6' (1.829 m)   Wt 197 lb (89.4 kg)   SpO2 98%   BMI 26.72 kg/m   Physical Exam Constitutional:      Appearance: Normal appearance. He is not ill-appearing or diaphoretic.  HENT:     Nose: Nose normal.     Mouth/Throat:     Mouth: Mucous membranes are moist.  Eyes:     General: No scleral icterus.    Conjunctiva/sclera: Conjunctivae normal.  Neck:     Musculoskeletal: Normal range of motion and neck supple.  Cardiovascular:     Rate and Rhythm: Regular rhythm. Bradycardia present.     Heart sounds: No murmur.     Comments: EKG ---  Sinus  Bradycardia  Low voltage -possible pulmonary disease.   ABNORMAL  Pulmonary:     Effort: Pulmonary effort is normal.  Breath sounds: No stridor. No wheezing, rhonchi or rales.  Abdominal:     General: Abdomen is flat. Bowel sounds are normal. There is no distension.     Palpations: There is no hepatomegaly or splenomegaly.     Tenderness: There is no abdominal tenderness.     Hernia: No hernia is present.  Genitourinary:    Comments: GU and rectal exams are deferred at his request.  He tells me that a urologist did this about 3 months ago. Musculoskeletal:     Right lower leg: No edema.     Left lower leg: No edema.  Skin:    General: Skin is warm.      Findings: No lesion or rash.  Neurological:     General: No focal deficit present.     Mental Status: He is alert and oriented to person, place, and time. Mental status is at baseline.  Psychiatric:        Mood and Affect: Mood normal.        Behavior: Behavior normal.      Lab Results  Component Value Date   WBC 8.0 03/29/2019   HGB 13.9 03/29/2019   HCT 41.4 03/29/2019   PLT 343.0 03/29/2019   GLUCOSE 143 (H) 03/29/2019   CHOL 162 03/29/2019   TRIG 106.0 03/29/2019   HDL 53.70 03/29/2019   LDLCALC 87 03/29/2019   ALT 16 03/29/2019   AST 19 03/29/2019   NA 138 03/29/2019   K 3.6 03/29/2019   CL 104 03/29/2019   CREATININE 1.04 03/29/2019   BUN 22 03/29/2019   CO2 26 03/29/2019   TSH 1.12 03/29/2019   PSA 1.01 01/11/2019   INR 0.9 05/31/2007     Assessment & Plan:   Carlosalberto was seen today for hypertension and hyperlipidemia.  Diagnoses and all orders for this visit:  Essential hypertension- His blood pressure is adequately well controlled.  He is mildly bradycardic but is asymptomatic with this.  Will continue the current antihypertensive regimen. -     EKG 12-Lead -     Basic metabolic panel; Future -     CBC with Differential/Platelet; Future  Acute drug-induced gout of foot, unspecified laterality  Prostate nodule without urinary obstruction dx prostate ca/ limited 02/2014   Need for influenza vaccination -     Flu Vaccine QUAD High Dose(Fluad)  Dyslipidemia, goal LDL below 130- He has a moderately elevated ASCVD risk score so I have asked him to take a statin for CV risk reduction. -     Lipid panel; Future -     TSH; Future -     Hepatic function panel; Future -     rosuvastatin (CRESTOR) 10 MG tablet; Take 1 tablet (10 mg total) by mouth daily.  Routine general medical examination at a health care facility- Exam completed, labs reviewed, vaccines reviewed and updated, Cologuard is ordered to screen for colon cancer/polyps, patient education was given.   Colon cancer screening -     Cologuard  Need for pneumococcal vaccination -     Pneumococcal polysaccharide vaccine 23-valent greater than or equal to 2yo subcutaneous/IM  Hyperglycemia- I have asked him to come back for an A1c to screen for DM 2. -     Hemoglobin A1c; Future   I am having Cristopher Estimable. Kinkaid start on rosuvastatin. I am also having him maintain his aspirin, multivitamin with minerals, sildenafil, furosemide, amLODipine, and metoprolol tartrate.  Meds ordered this encounter  Medications  . rosuvastatin (CRESTOR) 10  MG tablet    Sig: Take 1 tablet (10 mg total) by mouth daily.    Dispense:  90 tablet    Refill:  1     Follow-up: Return in about 6 months (around 09/25/2019).  Scarlette Calico, MD

## 2019-03-28 NOTE — Patient Instructions (Signed)

## 2019-03-29 ENCOUNTER — Encounter: Payer: Self-pay | Admitting: Internal Medicine

## 2019-03-29 ENCOUNTER — Telehealth: Payer: Self-pay | Admitting: Internal Medicine

## 2019-03-29 ENCOUNTER — Other Ambulatory Visit (INDEPENDENT_AMBULATORY_CARE_PROVIDER_SITE_OTHER): Payer: Medicare Other

## 2019-03-29 DIAGNOSIS — Z1211 Encounter for screening for malignant neoplasm of colon: Secondary | ICD-10-CM | POA: Insufficient documentation

## 2019-03-29 DIAGNOSIS — E785 Hyperlipidemia, unspecified: Secondary | ICD-10-CM | POA: Diagnosis not present

## 2019-03-29 DIAGNOSIS — I10 Essential (primary) hypertension: Secondary | ICD-10-CM

## 2019-03-29 DIAGNOSIS — Z23 Encounter for immunization: Secondary | ICD-10-CM | POA: Insufficient documentation

## 2019-03-29 DIAGNOSIS — R739 Hyperglycemia, unspecified: Secondary | ICD-10-CM | POA: Insufficient documentation

## 2019-03-29 LAB — CBC WITH DIFFERENTIAL/PLATELET
Basophils Absolute: 0 10*3/uL (ref 0.0–0.1)
Basophils Relative: 0.4 % (ref 0.0–3.0)
Eosinophils Absolute: 0.2 10*3/uL (ref 0.0–0.7)
Eosinophils Relative: 2.5 % (ref 0.0–5.0)
HCT: 41.4 % (ref 39.0–52.0)
Hemoglobin: 13.9 g/dL (ref 13.0–17.0)
Lymphocytes Relative: 33 % (ref 12.0–46.0)
Lymphs Abs: 2.6 10*3/uL (ref 0.7–4.0)
MCHC: 33.5 g/dL (ref 30.0–36.0)
MCV: 94.5 fl (ref 78.0–100.0)
Monocytes Absolute: 0.5 10*3/uL (ref 0.1–1.0)
Monocytes Relative: 6.9 % (ref 3.0–12.0)
Neutro Abs: 4.6 10*3/uL (ref 1.4–7.7)
Neutrophils Relative %: 57.2 % (ref 43.0–77.0)
Platelets: 343 10*3/uL (ref 150.0–400.0)
RBC: 4.39 Mil/uL (ref 4.22–5.81)
RDW: 12.7 % (ref 11.5–15.5)
WBC: 8 10*3/uL (ref 4.0–10.5)

## 2019-03-29 LAB — BASIC METABOLIC PANEL
BUN: 22 mg/dL (ref 6–23)
CO2: 26 mEq/L (ref 19–32)
Calcium: 9 mg/dL (ref 8.4–10.5)
Chloride: 104 mEq/L (ref 96–112)
Creatinine, Ser: 1.04 mg/dL (ref 0.40–1.50)
GFR: 70.19 mL/min (ref >60.00)
Glucose, Bld: 143 mg/dL — ABNORMAL HIGH (ref 70–99)
Potassium: 3.6 mEq/L (ref 3.5–5.1)
Sodium: 138 mEq/L (ref 135–145)

## 2019-03-29 LAB — HEPATIC FUNCTION PANEL
ALT: 16 U/L (ref 0–53)
AST: 19 U/L (ref 0–37)
Albumin: 3.9 g/dL (ref 3.5–5.2)
Alkaline Phosphatase: 96 U/L (ref 39–117)
Bilirubin, Direct: 0.1 mg/dL (ref 0.0–0.3)
Total Bilirubin: 0.6 mg/dL (ref 0.2–1.2)
Total Protein: 7.1 g/dL (ref 6.0–8.3)

## 2019-03-29 LAB — LIPID PANEL
Cholesterol: 162 mg/dL (ref 0–200)
HDL: 53.7 mg/dL (ref >39.00)
LDL Cholesterol: 87 mg/dL (ref 0–99)
NonHDL: 107.87
Total CHOL/HDL Ratio: 3
Triglycerides: 106 mg/dL (ref 0.0–149.0)
VLDL: 21.2 mg/dL (ref 0.0–40.0)

## 2019-03-29 LAB — TSH: TSH: 1.12 u[IU]/mL (ref 0.35–4.50)

## 2019-03-29 NOTE — Telephone Encounter (Signed)
Copied from [place] [id number]. Topic: General - Other >> Mar 29, 2019 12:43 PM Mathis Bud wrote: Reason for CRM: Patient called to get lab results. Patient call back [phone]

## 2019-03-30 ENCOUNTER — Other Ambulatory Visit (INDEPENDENT_AMBULATORY_CARE_PROVIDER_SITE_OTHER): Payer: Medicare Other

## 2019-03-30 ENCOUNTER — Telehealth: Payer: Self-pay

## 2019-03-30 DIAGNOSIS — R739 Hyperglycemia, unspecified: Secondary | ICD-10-CM | POA: Diagnosis not present

## 2019-03-30 LAB — HEMOGLOBIN A1C: Hgb A1c MFr Bld: 5.6 % (ref 4.6–6.5)

## 2019-03-30 NOTE — Telephone Encounter (Signed)
error 

## 2019-04-14 DIAGNOSIS — Z1212 Encounter for screening for malignant neoplasm of rectum: Secondary | ICD-10-CM | POA: Diagnosis not present

## 2019-04-14 DIAGNOSIS — Z1211 Encounter for screening for malignant neoplasm of colon: Secondary | ICD-10-CM | POA: Diagnosis not present

## 2019-04-17 DIAGNOSIS — L821 Other seborrheic keratosis: Secondary | ICD-10-CM | POA: Diagnosis not present

## 2019-04-17 DIAGNOSIS — Z85828 Personal history of other malignant neoplasm of skin: Secondary | ICD-10-CM | POA: Diagnosis not present

## 2019-04-17 DIAGNOSIS — L812 Freckles: Secondary | ICD-10-CM | POA: Diagnosis not present

## 2019-04-17 DIAGNOSIS — L570 Actinic keratosis: Secondary | ICD-10-CM | POA: Diagnosis not present

## 2019-04-20 ENCOUNTER — Encounter: Payer: Self-pay | Admitting: Internal Medicine

## 2019-04-20 ENCOUNTER — Ambulatory Visit (INDEPENDENT_AMBULATORY_CARE_PROVIDER_SITE_OTHER)
Admission: RE | Admit: 2019-04-20 | Discharge: 2019-04-20 | Disposition: A | Discharge status: Home / Self Care | Payer: Medicare Other | Source: Ambulatory Visit | Attending: Internal Medicine | Admitting: Internal Medicine

## 2019-04-20 ENCOUNTER — Ambulatory Visit (INDEPENDENT_AMBULATORY_CARE_PROVIDER_SITE_OTHER): Payer: Medicare Other | Admitting: Internal Medicine

## 2019-04-20 ENCOUNTER — Other Ambulatory Visit: Payer: Self-pay

## 2019-04-20 VITALS — BP 126/78 | HR 71 | Temp 98.3°F | Ht 72.0 in | Wt 198.0 lb

## 2019-04-20 DIAGNOSIS — S299XXA Unspecified injury of thorax, initial encounter: Secondary | ICD-10-CM | POA: Diagnosis not present

## 2019-04-20 DIAGNOSIS — J439 Emphysema, unspecified: Secondary | ICD-10-CM | POA: Diagnosis not present

## 2019-04-20 LAB — COLOGUARD: Cologuard: NEGATIVE

## 2019-04-20 NOTE — Progress Notes (Signed)
Subjective:  Patient ID: Cory Vasquez, male    DOB: Nov 18, 1945  Age: 73 y.o. MRN: [id number]  CC: Fall (on 9/21, landed on his leaf blower ) and Chest Pain (Right side)   HPI Cory Vasquez presents for concerns about his right chest - He was working in the yard 3 days ago and says he fell and landed on his leaf blower.  He injured his right lower anterior rib cage.  He has some soreness in the area but he has not noticed any bruising or swelling.  He is controlling the pain with Tylenol.  He denies shortness of breath, hemoptysis, neck pain, or back pain.  Outpatient Medications Prior to Visit  Medication Sig Dispense Refill  . amLODipine (NORVASC) 10 MG tablet Take 1 tablet (10 mg total) by mouth daily. 30 tablet 11  . aspirin (BAYER LOW STRENGTH) 81 MG EC tablet Take 81 mg by mouth daily.      . furosemide (LASIX) 20 MG tablet Take 1 tablet (20 mg total) by mouth daily. 30 tablet 11  . metoprolol tartrate (LOPRESSOR) 25 MG tablet Take 1 tablet (25 mg total) by mouth 2 (two) times daily. 180 tablet 1  . Multiple Vitamin (MULTIVITAMIN WITH MINERALS) TABS tablet Take 1 tablet by mouth daily.    . rosuvastatin (CRESTOR) 10 MG tablet Take 1 tablet (10 mg total) by mouth daily. 90 tablet 1  . sildenafil (REVATIO) 20 MG tablet Take 20 mg by mouth as needed.     No facility-administered medications prior to visit.     ROS Review of Systems  Constitutional: Negative for chills, diaphoresis, fatigue and fever.  HENT: Negative.   Eyes: Negative for visual disturbance.  Respiratory: Negative for cough, chest tightness, shortness of breath and wheezing.   Cardiovascular: Positive for chest pain. Negative for palpitations and leg swelling.  Gastrointestinal: Negative for abdominal pain, nausea and vomiting.  Endocrine: Negative.   Genitourinary: Negative.  Negative for dysuria and hematuria.  Musculoskeletal: Negative for arthralgias, back pain, neck pain and neck stiffness.  Skin:  Negative.  Negative for color change.  Neurological: Negative for dizziness, weakness, light-headedness and headaches.  Hematological: Negative for adenopathy. Does not bruise/bleed easily.  Psychiatric/Behavioral: Negative.     Objective:  BP 126/78 (BP Location: Left Arm, Patient Position: Sitting, Cuff Size: Normal)   Pulse 71   Temp 98.3 F (36.8 C) (Oral)   Ht 6' (1.829 m)   Wt 198 lb (89.8 kg)   SpO2 97%   BMI 26.85 kg/m   BP Readings from Last 3 Encounters:  04/20/19 126/78  03/28/19 126/70  01/17/19 124/74    Wt Readings from Last 3 Encounters:  04/20/19 198 lb (89.8 kg)  03/28/19 197 lb (89.4 kg)  01/17/19 197 lb (89.4 kg)    Physical Exam Vitals signs reviewed.  Constitutional:      General: He is not in acute distress.    Appearance: He is well-developed. He is not ill-appearing, toxic-appearing or diaphoretic.  HENT:     Nose: Nose normal.     Mouth/Throat:     Mouth: Mucous membranes are moist.  Eyes:     General: No scleral icterus.    Conjunctiva/sclera: Conjunctivae normal.  Neck:     Musculoskeletal: Normal range of motion and neck supple.  Cardiovascular:     Rate and Rhythm: Normal rate and regular rhythm.     Pulses: Normal pulses.     Heart sounds: No murmur. No friction rub.  No gallop.   Pulmonary:     Effort: Pulmonary effort is normal. No tachypnea.     Breath sounds: Normal breath sounds. No stridor or decreased air movement. No decreased breath sounds, wheezing, rhonchi or rales.  Chest:     Chest wall: Tenderness present. No deformity, swelling, crepitus or edema.    Abdominal:     General: Abdomen is flat.     Palpations: There is no mass.     Tenderness: There is no abdominal tenderness.  Musculoskeletal: Normal range of motion.     Right lower leg: No edema.     Left lower leg: No edema.  Skin:    General: Skin is warm and dry.  Neurological:     General: No focal deficit present.     Mental Status: He is alert.      Lab Results  Component Value Date   WBC 8.0 03/29/2019   HGB 13.9 03/29/2019   HCT 41.4 03/29/2019   PLT 343.0 03/29/2019   GLUCOSE 143 (H) 03/29/2019   CHOL 162 03/29/2019   TRIG 106.0 03/29/2019   HDL 53.70 03/29/2019   LDLCALC 87 03/29/2019   ALT 16 03/29/2019   AST 19 03/29/2019   NA 138 03/29/2019   K 3.6 03/29/2019   CL 104 03/29/2019   CREATININE 1.04 03/29/2019   BUN 22 03/29/2019   CO2 26 03/29/2019   TSH 1.12 03/29/2019   PSA 1.01 01/11/2019   INR 0.9 05/31/2007   HGBA1C 5.6 03/30/2019    Dg Chest 2 View  Result Date: 03/25/2018 CLINICAL DATA:  Hypertension, new cough, history prostate cancer EXAM: CHEST - 2 VIEW COMPARISON:  03/09/2017 FINDINGS: Normal heart size, mediastinal contours, and pulmonary vascularity. Lungs clear. No pleural effusion or pneumothorax. Mild BILATERAL glenohumeral degenerative changes. Minimal degenerative disc disease changes at thoracic spine. Bones otherwise unremarkable. IMPRESSION: No acute abnormalities. Electronically Signed   By: Lavonia Dana M.D.   On: 03/25/2018 15:03    Dg Chest 2 View  Result Date: 04/20/2019 CLINICAL DATA:  73 year old male with a history of chest wall injury EXAM: CHEST - 2 VIEW COMPARISON:  March 25, 2018 FINDINGS: Cardiomediastinal silhouette within normal limits in size and contour. Stigmata of emphysema, with increased retrosternal airspace, flattened hemidiaphragms, increased AP diameter, and hyperinflation on the AP view. Coarsened interstitial markings. No pneumothorax or pleural effusion. No confluent airspace disease IMPRESSION: Chronic lung changes without evidence of acute cardiopulmonary disease Electronically Signed   By: Corrie Mckusick D.O.   On: 04/20/2019 14:36    Assessment & Plan:   Pacer was seen today for fall and chest pain.  Diagnoses and all orders for this visit:  Injury of chest wall, initial encounter- He has some discomfort in the area but the examination of the area and the normal  plain films are reassuring.  This is likely a chest wall contusion.  He will continue Tylenol as needed and I recommended that he apply ice to the affected area. -     DG Chest 2 View; Future   I am having Cristopher Estimable. Despain maintain his aspirin, multivitamin with minerals, sildenafil, furosemide, amLODipine, metoprolol tartrate, and rosuvastatin.  No orders of the defined types were placed in this encounter.    Follow-up: No follow-ups on file.  Scarlette Calico, MD

## 2019-04-21 ENCOUNTER — Encounter: Payer: Self-pay | Admitting: Internal Medicine

## 2019-04-21 NOTE — Patient Instructions (Signed)

## 2019-05-01 ENCOUNTER — Encounter: Payer: Self-pay | Admitting: Internal Medicine

## 2019-06-26 ENCOUNTER — Other Ambulatory Visit: Payer: Self-pay | Admitting: Internal Medicine

## 2019-06-26 MED ORDER — FUROSEMIDE 20 MG PO TABS: 20.0000 mg | ORAL_TABLET | Freq: Every day | ORAL | 11 refills | Status: DC

## 2019-07-17 DIAGNOSIS — L570 Actinic keratosis: Secondary | ICD-10-CM | POA: Diagnosis not present

## 2019-07-24 LAB — PSA: PSA: 1.16

## 2019-07-31 ENCOUNTER — Other Ambulatory Visit: Payer: Self-pay | Admitting: Urology

## 2019-07-31 DIAGNOSIS — C61 Malignant neoplasm of prostate: Secondary | ICD-10-CM

## 2019-08-01 ENCOUNTER — Other Ambulatory Visit: Payer: Medicare Other

## 2019-08-18 ENCOUNTER — Other Ambulatory Visit: Payer: Self-pay

## 2019-08-18 MED ORDER — AMLODIPINE BESYLATE 10 MG PO TABS: 10.0000 mg | ORAL_TABLET | Freq: Every day | ORAL | 11 refills | Status: DC

## 2019-08-18 MED ORDER — METOPROLOL TARTRATE 25 MG PO TABS: 25.0000 mg | ORAL_TABLET | Freq: Two times a day (BID) | ORAL | 1 refills | Status: DC

## 2019-08-19 ENCOUNTER — Ambulatory Visit: Payer: Medicare Other | Attending: Internal Medicine

## 2019-08-19 DIAGNOSIS — Z23 Encounter for immunization: Secondary | ICD-10-CM

## 2019-08-22 ENCOUNTER — Other Ambulatory Visit: Payer: Medicare Other

## 2019-08-22 ENCOUNTER — Ambulatory Visit: Payer: Medicare Other

## 2019-08-22 NOTE — Progress Notes (Signed)
Cory Reining, MD Reason for referral-supraventricular tachycardia  HPI: 74 year old male for evaluation of SVT at request of Scarlette Calico, MD.  Previously seen but not since August 2017. He did have nonsustained ventricular tachycardia on a prior exercise treadmill but his LV function is normal. A previous Myoview in November 2005 showed an ejection fraction of 58% with no ischemia or infarction. Echo 8/16 showed normal LV function, grade 1 diastolic dysfunction. Event monitor 8/16 negative. ETT 10/16 negative. 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 He converted spontaneously to sinus rhythm.  Since he was seen in August 2017 he has rare brief palpitations but no recurrent SVT.  He denies dyspnea, chest pain, palpitations or syncope.  Current Outpatient Medications  Medication Sig Dispense Refill  . amLODipine (NORVASC) 10 MG tablet Take 1 tablet (10 mg total) by mouth daily. 30 tablet 11  . aspirin (BAYER LOW STRENGTH) 81 MG EC tablet Take 81 mg by mouth daily.      . furosemide (LASIX) 20 MG tablet Take 1 tablet (20 mg total) by mouth daily. 30 tablet 11  . metoprolol tartrate (LOPRESSOR) 25 MG tablet Take 1 tablet (25 mg total) by mouth 2 (two) times daily. (Patient not taking: Reported on 08/28/2019) 180 tablet 1  . Multiple Vitamin (MULTIVITAMIN WITH MINERALS) TABS tablet Take 1 tablet by mouth daily.    . rosuvastatin (CRESTOR) 10 MG tablet Take 1 tablet (10 mg total) by mouth daily. 90 tablet 1  . sildenafil (REVATIO) 20 MG tablet Take 20 mg by mouth as needed.     No current facility-administered medications for this visit.    No Known Allergies   Past Medical History:  Diagnosis Date  . Diverticulosis    see colonoscopy 5/06 rec fu 11/2014  . HBP (high blood pressure)    ACE d/c'd 11/02/08. cough/sorethroat > resolved  . Health maintenance examination    [name]. Td June 30/2011. CPX June 30/2011  . Hyperlipidemia   . Palpitations    neg myoview 11/05 with EF 58%  . Prostate cancer ([place])   . SVT (supraventricular tachycardia) ([facility])     Past Surgical History:  Procedure Laterality Date  . BACK SURGERY     [name] 11/08    Social History   Socioeconomic History  . Marital status: Married    Spouse name: Not on file  . Number of children: 2  . Years of education: Not on file  . Highest education level: Not on file  Occupational History  . Occupation: Salesman  Tobacco Use  . Smoking status: Never Smoker  . Smokeless tobacco: Never Used  Substance and Sexual Activity  . Alcohol use: Yes    Alcohol/week: 5.0 - 6.0 standard drinks    Types: 5 - 6 Standard drinks or equivalent per week    Comment: Occasional  . Drug use: No  . Sexual activity: Not on file  Other Topics Concern  . Not on file  Social History Narrative   Denies flu shot 09/29/2010         Social Determinants of Health   Financial Resource Strain:   . Difficulty of Paying Living Expenses: Not on file  Food Insecurity:   . Worried About Charity fundraiser in the Last Year: Not on file  . Ran Out of Food in the Last Year: Not on file  Transportation Needs:   .  Lack of Transportation (Medical): Not on file  . Lack of Transportation (Non-Medical): Not on file  Physical Activity:   . Days of Exercise per Week: Not on file  . Minutes of Exercise per Session: Not on file  Stress:   . Feeling of Stress : Not on file  Social Connections:   . Frequency of Communication with Friends and Family: Not on file  . Frequency of Social Gatherings with Friends and Family: Not on file  . Attends Religious Services: Not on file  . Active Member of Clubs or Organizations: Not on file  . Attends Archivist Meetings: Not on file  . Marital Status: Not on file  Intimate Partner Violence:   . Fear of Current or Ex-Partner: Not on file  . Emotionally Abused: Not on  file  . Physically Abused: Not on file  . Sexually Abused: Not on file    Family History  Problem Relation Age of Onset  . Heart disease Father 76  . Diabetes Brother     ROS: no fevers or chills, productive cough, hemoptysis, dysphasia, odynophagia, melena, hematochezia, dysuria, hematuria, rash, seizure activity, orthopnea, PND, pedal edema, claudication. Remaining systems are negative.  Physical Exam:   Blood pressure 125/71, pulse 72, temperature (!) 95.4 F (35.2 C), height 6' (1.829 m), weight 202 lb (91.6 kg), SpO2 99 %.  General:  Well developed/well nourished in NAD Skin warm/dry Patient not depressed No peripheral clubbing Back-normal HEENT-normal/normal eyelids Neck supple/normal carotid upstroke bilaterally; no bruits; no JVD; no thyromegaly chest - CTA/ normal expansion CV - RRR/normal S1 and S2; no murmurs, rubs or gallops;  PMI nondisplaced Abdomen -NT/ND, no HSM, no mass, + bowel sounds, no bruit 2+ femoral pulses, no bruits Ext-no edema, chords, 2+ DP Neuro-grossly nonfocal   A/P  1 SVT-he has had no recurrences since 3 years ago.  He will continue metoprolol as needed.  We can refer for ablation in the future if his episodes become more frequent.  2 hypertension-blood pressure controlled.  Continue present medical regimen and follow.  Note he has had pedal edema in the past.  If this worsens we could consider discontinuing amlodipine and trying a different antihypertensive.  3 hyperlipidemia-continue statin.  Management per primary care.  Kirk Ruths, MD

## 2019-08-28 ENCOUNTER — Ambulatory Visit: Payer: Medicare Other | Admitting: Cardiology

## 2019-08-28 ENCOUNTER — Encounter: Payer: Self-pay | Admitting: Cardiology

## 2019-08-28 ENCOUNTER — Other Ambulatory Visit: Payer: Self-pay | Admitting: *Deleted

## 2019-08-28 ENCOUNTER — Other Ambulatory Visit: Payer: Self-pay

## 2019-08-28 VITALS — BP 125/71 | HR 72 | Temp 95.4°F | Ht 72.0 in | Wt 202.0 lb

## 2019-08-28 DIAGNOSIS — I471 Supraventricular tachycardia: Secondary | ICD-10-CM

## 2019-08-28 DIAGNOSIS — E785 Hyperlipidemia, unspecified: Secondary | ICD-10-CM

## 2019-08-28 DIAGNOSIS — I10 Essential (primary) hypertension: Secondary | ICD-10-CM | POA: Diagnosis not present

## 2019-08-28 NOTE — Patient Instructions (Signed)

## 2019-09-14 ENCOUNTER — Other Ambulatory Visit: Payer: Medicare Other

## 2019-09-14 ENCOUNTER — Ambulatory Visit
Admission: RE | Admit: 2019-09-14 | Discharge: 2019-09-14 | Disposition: A | Discharge status: Home / Self Care | Payer: Medicare Other | Source: Ambulatory Visit | Attending: Urology | Admitting: Urology

## 2019-09-14 DIAGNOSIS — C61 Malignant neoplasm of prostate: Secondary | ICD-10-CM

## 2019-09-16 ENCOUNTER — Other Ambulatory Visit: Payer: Medicare Other

## 2019-09-16 ENCOUNTER — Other Ambulatory Visit: Payer: Self-pay | Admitting: Internal Medicine

## 2019-09-16 ENCOUNTER — Ambulatory Visit: Payer: Medicare Other

## 2019-09-16 DIAGNOSIS — E785 Hyperlipidemia, unspecified: Secondary | ICD-10-CM

## 2019-09-19 ENCOUNTER — Ambulatory Visit
Admission: RE | Admit: 2019-09-19 | Discharge: 2019-09-19 | Disposition: A | Discharge status: Home / Self Care | Payer: Medicare Other | Source: Ambulatory Visit | Attending: Urology | Admitting: Urology

## 2019-09-19 ENCOUNTER — Other Ambulatory Visit: Payer: Self-pay

## 2019-09-19 MED ORDER — GADOBENATE DIMEGLUMINE 529 MG/ML IV SOLN
17.0000 mL | Freq: Once | INTRAVENOUS | Status: AC | PRN
  Administered 2019-09-19: 17 mL via INTRAVENOUS

## 2019-09-23 ENCOUNTER — Ambulatory Visit: Payer: Medicare Other | Attending: Internal Medicine

## 2019-09-23 DIAGNOSIS — Z23 Encounter for immunization: Secondary | ICD-10-CM | POA: Insufficient documentation

## 2019-09-23 NOTE — Progress Notes (Signed)
   Covid-19 Vaccination Clinic  Name:  SHY BRUSH    MRN: [id number] DOB: [birth year]  09/23/2019  Mr. Wenner was observed post Covid-19 immunization for 15 minutes without incidence. He was provided with Vaccine Information Sheet and instruction to access the V-Safe system.   Mr. Melka was instructed to call 911 with any severe reactions post vaccine: Marland Kitchen Difficulty breathing  . Swelling of your face and throat  . A fast heartbeat  . A bad rash all over your body  . Dizziness and weakness    Immunizations Administered    Name Date Dose VIS Date Route   Moderna COVID-19 Vaccine 09/23/2019  8:45 AM 0.5 mL 06/27/2019 Intramuscular   Manufacturer: Moderna   Lot: [id number]   [id number]

## 2019-09-26 DIAGNOSIS — L821 Other seborrheic keratosis: Secondary | ICD-10-CM | POA: Diagnosis not present

## 2019-09-26 DIAGNOSIS — L570 Actinic keratosis: Secondary | ICD-10-CM | POA: Diagnosis not present

## 2019-09-26 DIAGNOSIS — Z85828 Personal history of other malignant neoplasm of skin: Secondary | ICD-10-CM | POA: Diagnosis not present

## 2019-12-15 ENCOUNTER — Telehealth: Payer: Self-pay | Admitting: Internal Medicine

## 2019-12-15 DIAGNOSIS — I10 Essential (primary) hypertension: Secondary | ICD-10-CM

## 2019-12-15 NOTE — Progress Notes (Signed)
  Chronic Care Management   Note  12/15/2019 Name: AUGUSTIN VISCOMI MRN: [id number] DOB: 10-04-1945  JERRETT DURLING is a 74 y.o. year old male who is a primary care patient of [name], MD. I reached out to Mena Pauls by phone today in response to a referral sent by Mr. Cristopher Estimable Dahms's PCP, [name], MD.   Mr. Stewart was given information about Chronic Care Management services today including:  1. CCM service includes personalized support from designated clinical staff supervised by his physician, including individualized plan of care and coordination with other care providers 2. 24/7 contact phone numbers for assistance for urgent and routine care needs. 3. Service will only be billed when office clinical staff spend 20 minutes or more in a month to coordinate care. 4. Only one practitioner may furnish and bill the service in a calendar month. 5. The patient may stop CCM services at any time (effective at the end of the month) by phone call to the office staff.   Patient agreed to services and verbal consent obtained.   This note is not being shared with the patient for the following reason: To respect privacy (The patient or proxy has requested that the information not be shared). Follow up plan:   Earney Hamburg Upstream Scheduler

## 2020-01-17 LAB — PSA: PSA: 1.54

## 2020-01-24 ENCOUNTER — Ambulatory Visit (INDEPENDENT_AMBULATORY_CARE_PROVIDER_SITE_OTHER): Payer: Medicare Other

## 2020-01-24 ENCOUNTER — Other Ambulatory Visit: Payer: Self-pay

## 2020-01-24 ENCOUNTER — Ambulatory Visit: Payer: Medicare Other | Admitting: Internal Medicine

## 2020-01-24 ENCOUNTER — Telehealth: Payer: Medicare Other | Admitting: Family

## 2020-01-24 ENCOUNTER — Ambulatory Visit (INDEPENDENT_AMBULATORY_CARE_PROVIDER_SITE_OTHER): Payer: Medicare Other | Admitting: Internal Medicine

## 2020-01-24 ENCOUNTER — Encounter: Payer: Self-pay | Admitting: Internal Medicine

## 2020-01-24 VITALS — BP 136/76 | HR 77 | Temp 98.2°F | Ht 72.0 in | Wt 194.2 lb

## 2020-01-24 DIAGNOSIS — M5030 Other cervical disc degeneration, unspecified cervical region: Secondary | ICD-10-CM | POA: Diagnosis not present

## 2020-01-24 DIAGNOSIS — M542 Cervicalgia: Secondary | ICD-10-CM

## 2020-01-24 LAB — CBC WITH DIFFERENTIAL/PLATELET
Basophils Absolute: 0 10*3/uL (ref 0.0–0.1)
Basophils Relative: 0.3 % (ref 0.0–3.0)
Eosinophils Absolute: 0.2 10*3/uL (ref 0.0–0.7)
Eosinophils Relative: 1.7 % (ref 0.0–5.0)
HCT: 42.8 % (ref 39.0–52.0)
Hemoglobin: 14.7 g/dL (ref 13.0–17.0)
Lymphocytes Relative: 23.6 % (ref 12.0–46.0)
Lymphs Abs: 2.4 10*3/uL (ref 0.7–4.0)
MCHC: 34.3 g/dL (ref 30.0–36.0)
MCV: 93.5 fl (ref 78.0–100.0)
Monocytes Absolute: 1 10*3/uL (ref 0.1–1.0)
Monocytes Relative: 9.4 % (ref 3.0–12.0)
Neutro Abs: 6.7 10*3/uL (ref 1.4–7.7)
Neutrophils Relative %: 65 % (ref 43.0–77.0)
Platelets: 261 10*3/uL (ref 150.0–400.0)
RBC: 4.57 Mil/uL (ref 4.22–5.81)
RDW: 12.7 % (ref 11.5–15.5)
WBC: 10.3 10*3/uL (ref 4.0–10.5)

## 2020-01-24 LAB — SEDIMENTATION RATE: Sed Rate: 39 mm/hr — ABNORMAL HIGH (ref 0–20)

## 2020-01-24 MED ORDER — KETOROLAC TROMETHAMINE 60 MG/2ML IM SOLN
60.0000 mg | Freq: Once | INTRAMUSCULAR | Status: AC
  Administered 2020-01-24: 60 mg via INTRAMUSCULAR

## 2020-01-24 MED ORDER — RELAFEN DS 1000 MG PO TABS: 1.0000 | ORAL_TABLET | Freq: Every day | ORAL | 0 refills | Status: DC | PRN

## 2020-01-24 MED ORDER — METHYLPREDNISOLONE 4 MG PO TBPK: ORAL_TABLET | ORAL | 0 refills | Status: AC

## 2020-01-24 NOTE — Patient Instructions (Signed)
Degenerative Disk Disease  Degenerative disk disease is a condition caused by changes that occur in the spinal disks as a person ages. Spinal disks are soft and compressible disks located between the bones of your spine (vertebrae). These disks act like shock absorbers. Degenerative disk disease can affect the whole spine. However, the neck and lower back are most often affected. Many changes can occur in the spinal disks with aging, such as:  The spinal disks may dry and shrink.  Small tears may occur in the tough, outer covering of the disk (annulus).  The disk space may become smaller due to loss of water.  Abnormal growths in the bone (spurs) may occur. This can put pressure on the nerve roots exiting the spinal canal, causing pain.  The spinal canal may become narrowed. What are the causes? This condition may be caused by:  Normal degeneration with age.  Injuries.  Certain activities and sports that cause damage. What increases the risk? The following factors may make you more likely to develop this condition:  Being overweight.  Having a family history of degenerative disk disease.  Smoking.  Sudden injury.  Doing work that requires heavy lifting. What are the signs or symptoms? Symptoms of this condition include:  Pain that varies in intensity. Some people have no pain, while others have severe pain. The location of the pain depends on the part of your backbone that is affected. You may have: ? Pain in your neck or arm if a disk in your neck area is affected. ? Pain in your back, buttocks, or legs if a disk in your lower back is affected.  Pain that becomes worse while bending or reaching up, or with twisting movements.  Pain that may start gradually and then get worse as time passes. It may also start after a major or minor injury.  Numbness or tingling in the arms or legs. How is this diagnosed? This condition may be diagnosed based on:  Your symptoms and  medical history.  A physical exam.  Imaging tests, including: ? An X-ray of the spine. ? MRI. How is this treated? This condition may be treated with:  Medicines.  Rehabilitation exercises. These activities aim to strengthen muscles in your back and abdomen to better support your spine. If treatments do not help to relieve your symptoms or you have severe pain, you may need surgery. Follow these instructions at home: Medicines  Take over-the-counter and prescription medicines only as told by your health care provider.  Do not drive or use heavy machinery while taking prescription pain medicine.  If you are taking prescription pain medicine, take actions to prevent or treat constipation. Your health care provider may recommend that you: ? Drink enough fluid to keep your urine pale yellow. ? Eat foods that are high in fiber, such as fresh fruits and vegetables, whole grains, and beans. ? Limit foods that are high in fat and processed sugars, such as fried or sweet foods. ? Take an over-the-counter or prescription medicine for constipation. Activity  Rest as told by your health care provider.  Ask your health care provider what activities are safe for you. Return to your normal activities as directed.  Avoid sitting for a long time without moving. Get up to take short walks every 1-2 hours. This is important to improve blood flow and breathing. Ask for help if you feel weak or unsteady.  Perform relaxation exercises as told by your health care provider.  Maintain good posture.    Do not lift anything that is heavier than 10 lb (4.5 kg), or the limit that you are told, until your health care provider says that it is safe.  Follow proper lifting and walking techniques as told by your health care provider. Managing pain, stiffness, and swelling   If directed, put ice on the painful area. Icing can help to relieve pain. ? Put ice in a plastic bag. ? Place a towel between your  skin and the bag. ? Leave the ice on for 20 minutes, 2-3 times a day.  If directed, apply heat to the painful area as often as told by your health care provider. Heat can reduce the stiffness of your muscles. Use the heat source that your health care provider recommends, such as a moist heat pack or a heating pad. ? Place a towel between your skin and the heat source. ? Leave the heat on for 20-30 minutes. ? Remove the heat if your skin turns bright red. This is especially important if you are unable to feel pain, heat, or cold. You may have a greater risk of getting burned. General instructions  Change your sitting, standing, and sleeping habits as told by your health care provider.  Avoid sitting in the same position for long periods of time. Change positions frequently.  Lose weight or maintain a healthy weight as told by your health care provider.  Do not use any products that contain nicotine or tobacco, such as cigarettes and e-cigarettes. If you need help quitting, ask your health care provider.  Wear supportive footwear.  Keep all follow-up visits as told by your health care provider. This is important. This may include visits for physical therapy. Contact a health care provider if you:  Have pain that does not go away within 1-4 weeks.  Lose your appetite.  Lose weight without trying. Get help right away if you:  Have severe pain.  Notice weakness in your arms, hands, or legs.  Begin to lose control of your bladder or bowel movements.  Have fevers or night sweats. Summary  Degenerative disk disease is a condition caused by changes that occur in the spinal disks as a person ages.  Degenerative disk disease can affect the whole spine. However, the neck and lower back are most often affected.  Take over-the-counter and prescription medicines only as told by your health care provider. This information is not intended to replace advice given to you by your health care  provider. Make sure you discuss any questions you have with your health care provider. Document Revised: 07/08/2017 Document Reviewed: 07/08/2017 Elsevier Patient Education  2020 [employer]  

## 2020-01-24 NOTE — Progress Notes (Signed)
Subjective:  Patient ID: Cory Vasquez, male    DOB: 08-Dec-1945  Age: 74 y.o. MRN: [id number]  CC: Neck Pain  This visit occurred during the SARS-CoV-2 public health emergency.  Safety protocols were in place, including screening questions prior to the visit, additional usage of staff PPE, and extensive cleaning of exam room while observing appropriate contact time as indicated for disinfecting solutions.    HPI [name] presents for concerns about his neck - He woke up 2 days prior to this visit with posterior neck discomfort that he describes as a stiffness and a throbbing sensation that radiates anteriorly.  There is no pain that radiates into his extremities and he denies paresthesias.  He has had mild trouble swallowing.  He denies paresthesias.  He has not gotten much symptom relief with aspirin and he is requesting something for pain.  He denies any history of trauma or injury.  Outpatient Medications Prior to Visit  Medication Sig Dispense Refill  . amLODipine (NORVASC) 10 MG tablet Take 1 tablet (10 mg total) by mouth daily. 30 tablet 11  . aspirin (BAYER LOW STRENGTH) 81 MG EC tablet Take 81 mg by mouth daily.      . furosemide (LASIX) 20 MG tablet Take 1 tablet (20 mg total) by mouth daily. 30 tablet 11  . Multiple Vitamin (MULTIVITAMIN WITH MINERALS) TABS tablet Take 1 tablet by mouth daily.    . rosuvastatin (CRESTOR) 10 MG tablet TAKE 1 TABLET(10 MG) BY MOUTH DAILY 90 tablet 1  . sildenafil (REVATIO) 20 MG tablet Take 20 mg by mouth as needed.    . metoprolol tartrate (LOPRESSOR) 25 MG tablet Take 1 tablet (25 mg total) by mouth 2 (two) times daily. (Patient not taking: Reported on 08/28/2019) 180 tablet 1   No facility-administered medications prior to visit.    ROS Review of Systems  Constitutional: Negative.  Negative for chills, fatigue and fever.  HENT: Positive for trouble swallowing. Negative for sore throat and voice change.   Eyes: Negative.     Respiratory: Negative for cough, chest tightness, shortness of breath and wheezing.   Cardiovascular: Negative for chest pain, palpitations and leg swelling.  Gastrointestinal: Negative for abdominal pain, constipation, diarrhea, nausea and vomiting.  Endocrine: Negative.   Genitourinary: Negative.  Negative for difficulty urinating and dysuria.  Musculoskeletal: Positive for neck pain and neck stiffness. Negative for arthralgias.  Neurological: Negative.  Negative for dizziness, weakness and numbness.  Hematological: Negative for adenopathy. Does not bruise/bleed easily.  Psychiatric/Behavioral: Negative.     Objective:  BP 136/76 (BP Location: Left Arm, Patient Position: Sitting, Cuff Size: Normal)   Pulse 77   Temp 98.2 F (36.8 C) (Oral)   Ht 6' (1.829 m)   Wt 194 lb 4 oz (88.1 kg)   SpO2 98%   BMI 26.35 kg/m   BP Readings from Last 3 Encounters:  01/24/20 136/76  08/28/19 125/71  04/20/19 126/78    Wt Readings from Last 3 Encounters:  01/24/20 194 lb 4 oz (88.1 kg)  08/28/19 202 lb (91.6 kg)  04/20/19 198 lb (89.8 kg)    Physical Exam Vitals reviewed.  Constitutional:      General: He is not in acute distress.    Appearance: Normal appearance. He is not ill-appearing or toxic-appearing.  HENT:     Nose: Nose normal.     Mouth/Throat:     Lips: Pink.     Mouth: Mucous membranes are moist.  Palate: No mass.     Pharynx: Oropharynx is clear. No pharyngeal swelling, oropharyngeal exudate, posterior oropharyngeal erythema or uvula swelling.     Tonsils: No tonsillar exudate or tonsillar abscesses.  Eyes:     General: No scleral icterus.    Conjunctiva/sclera: Conjunctivae normal.  Neck:     Thyroid: No thyroid mass or thyromegaly.  Cardiovascular:     Rate and Rhythm: Normal rate and regular rhythm.     Heart sounds: No murmur heard.   Pulmonary:     Effort: Pulmonary effort is normal.     Breath sounds: No stridor. No wheezing, rhonchi or rales.   Abdominal:     General: Abdomen is flat.     Palpations: There is no mass.     Tenderness: There is no abdominal tenderness. There is no guarding.  Musculoskeletal:     Cervical back: No edema, erythema, signs of trauma, rigidity, tenderness or crepitus. Pain with movement present. No spinous process tenderness or muscular tenderness. Decreased range of motion.     Right lower leg: No edema.     Left lower leg: No edema.  Lymphadenopathy:     Cervical: No cervical adenopathy.  Skin:    General: Skin is warm and dry.     Coloration: Skin is not pale.  Neurological:     General: No focal deficit present.     Mental Status: He is alert and oriented to person, place, and time. Mental status is at baseline.     Cranial Nerves: Cranial nerves are intact.     Sensory: Sensation is intact.     Motor: No weakness or atrophy.     Coordination: Coordination is intact. Romberg sign negative. Coordination normal.     Deep Tendon Reflexes: Reflexes normal.     Reflex Scores:      Tricep reflexes are 0 on the right side and 0 on the left side.      Bicep reflexes are 0 on the right side and 0 on the left side.      Brachioradialis reflexes are 0 on the right side and 0 on the left side.      Patellar reflexes are 0 on the right side and 0 on the left side.      Achilles reflexes are 0 on the right side and 0 on the left side.    Lab Results  Component Value Date   WBC 10.3 01/24/2020   HGB 14.7 01/24/2020   HCT 42.8 01/24/2020   PLT 261.0 01/24/2020   GLUCOSE 143 (H) 03/29/2019   CHOL 162 03/29/2019   TRIG 106.0 03/29/2019   HDL 53.70 03/29/2019   LDLCALC 87 03/29/2019   ALT 16 03/29/2019   AST 19 03/29/2019   NA 138 03/29/2019   K 3.6 03/29/2019   CL 104 03/29/2019   CREATININE 1.04 03/29/2019   BUN 22 03/29/2019   CO2 26 03/29/2019   TSH 1.12 03/29/2019   PSA 1.16 07/24/2019   INR 0.9 05/31/2007   HGBA1C 5.6 03/30/2019    MR PROSTATE W WO CONTRAST  Result Date:  09/20/2019 CLINICAL DATA:  History of prostate cancer and elevated PSA. Biopsy in 2017. EXAM: MR PROSTATE WITHOUT AND WITH CONTRAST TECHNIQUE: Multiplanar multisequence MRI images were obtained of the pelvis centered about the prostate. Pre and post contrast images were obtained. CONTRAST:  67mL MULTIHANCE GADOBENATE DIMEGLUMINE 529 MG/ML IV SOLN Creatinine was obtained on site at [place] at [address] Results: Creatinine  0.9 mg/dL. COMPARISON:  None FINDINGS: Prostate: Diffuse T2 hypointensity about the posterior peripheral zone without corresponding restricted diffusion beyond mildly heterogeneous low signal on ADC. No signs of high B value restricted diffusion within the prostate. Area appearing slightly more focal on the left at the left base (image 15 of series 5), coronal images just below the central zone transition. There is some motion which limits assessment in addition to bowel gas. There is some mild asymmetry of enhancement within this area best seen on series 28, subtraction images. Mild BPH changes in the transitional zone. Volume: 40 cc Transcapsular spread:  Absent Seminal vesicle involvement: Absent Neurovascular bundle involvement: Absent Pelvic adenopathy: Absent Bone metastasis: Absent Other findings: Colonic diverticulosis. IMPRESSION: Signs of prostatitis and BPH. Asymmetry of low signal on T2 with limited diffusion and slight asymmetry of enhancement favoring left over right base with diffuse hypointensity in the peripheral zone midgland. Area in the left base to midgland assigned PIRADS category 3 in light of limited diffusion and other findings outlined above. This could also represent more profound changes of prostatitis. No signs of extracapsular disease or additional lesion of concern. Electronically Signed   By: Zetta Bills M.D.   On: 09/20/2019 10:59   DG Cervical Spine Complete  Result Date: 01/24/2020 CLINICAL DATA:  Neck pain and stiffness for 2 days.  EXAM: CERVICAL SPINE - COMPLETE 4+ VIEW COMPARISON:  None FINDINGS: Normal alignment of the cervical spine. The vertebral body heights are well preserved. The facet joints appear aligned. No fractures identified. Multi level disc space narrowing and endplate spurring noted at C4-5, C5-6, C6-7. Narrowing of the left C4-5 and C5-6 neural foramina. There is also narrowing of the right C4-5, C5-6 and C6-7 neural foramina. IMPRESSION: 1. No acute findings. 2. Multi level degenerative disc disease with bilateral neural foraminal narrowing. Electronically Signed   By: Kerby Moors M.D.   On: 01/24/2020 10:04     Assessment & Plan:   Seddrick was seen today for neck pain.  Diagnoses and all orders for this visit:  Neck pain, acute- Based on his symptoms, exam, plain films, and labs I think he is having an acute flare of degenerative disc disease.  I have some concern for spinal stenosis so I asked him to undergo an MRI of the cervical spine to see if he would benefit from surgical intervention.  Will treat his discomfort with an injection of Toradol, nabumetone, and a 6-day course of methylprednisolone. -     CBC with Differential/Platelet; Future -     Sedimentation rate; Future -     DG Cervical Spine Complete; Future -     Sedimentation rate -     CBC with Differential/Platelet -     methylPREDNISolone (MEDROL DOSEPAK) 4 MG TBPK tablet; TAKE AS DIRECTED -     MR Cervical Spine Wo Contrast; Future  Degenerative disc disease, cervical- See above. -     Nabumetone (RELAFEN DS) 1000 MG TABS; Take 1 tablet by mouth daily as needed. -     methylPREDNISolone (MEDROL DOSEPAK) 4 MG TBPK tablet; TAKE AS DIRECTED -     MR Cervical Spine Wo Contrast; Future -     ketorolac (TORADOL) injection 60 mg   I have discontinued Cristopher Estimable. Beigel's metoprolol tartrate. I am also having him start on Relafen DS and methylPREDNISolone. Additionally, I am having him maintain his aspirin, multivitamin with minerals,  sildenafil, furosemide, amLODipine, and rosuvastatin. We administered ketorolac.  Meds ordered this encounter  Medications  . Nabumetone (RELAFEN DS) 1000 MG TABS    Sig: Take 1 tablet by mouth daily as needed.    Dispense:  10 tablet    Refill:  0  . methylPREDNISolone (MEDROL DOSEPAK) 4 MG TBPK tablet    Sig: TAKE AS DIRECTED    Dispense:  21 tablet    Refill:  0  . ketorolac (TORADOL) injection 60 mg     Follow-up: Return in about 6 weeks (around 03/06/2020).  Scarlette Calico, MD

## 2020-01-30 DIAGNOSIS — L812 Freckles: Secondary | ICD-10-CM | POA: Diagnosis not present

## 2020-01-30 DIAGNOSIS — Z85828 Personal history of other malignant neoplasm of skin: Secondary | ICD-10-CM | POA: Diagnosis not present

## 2020-01-30 DIAGNOSIS — L570 Actinic keratosis: Secondary | ICD-10-CM | POA: Diagnosis not present

## 2020-01-30 DIAGNOSIS — D1801 Hemangioma of skin and subcutaneous tissue: Secondary | ICD-10-CM | POA: Diagnosis not present

## 2020-01-30 DIAGNOSIS — L820 Inflamed seborrheic keratosis: Secondary | ICD-10-CM | POA: Diagnosis not present

## 2020-01-30 DIAGNOSIS — L821 Other seborrheic keratosis: Secondary | ICD-10-CM | POA: Diagnosis not present

## 2020-02-21 ENCOUNTER — Other Ambulatory Visit: Payer: Self-pay

## 2020-02-21 ENCOUNTER — Ambulatory Visit: Payer: Medicare Other | Admitting: Pharmacist

## 2020-02-21 DIAGNOSIS — I10 Essential (primary) hypertension: Secondary | ICD-10-CM

## 2020-02-21 DIAGNOSIS — E785 Hyperlipidemia, unspecified: Secondary | ICD-10-CM

## 2020-02-21 NOTE — Patient Instructions (Addendum)
Visit Information  Phone number for Pharmacist: [phone]  Thank you for meeting with me to discuss your medications! I look forward to working with you to achieve your health care goals. Below is a summary of what we talked about during the visit:  Goals Addressed            This [place] (see longitudinal plan of care for additional care plan information)  Current Barriers:   Chronic Disease Management support, education, and care coordination needs related to Hypertension and Hyperlipidemia   Hypertension BP Readings from Last 3 Encounters:  01/24/20 136/76  08/28/19 125/71  04/20/19 126/78   Pharmacist Clinical Goal(s): o Over the next 180 days, patient will work with PharmD and providers to achieve BP goal <130/80  Current regimen:  o Amlodipine 10 mg daily o Furosemide 20 mg daily  Interventions: o Discussed BP goals and benefits of medication for prevention of heart attack / stroke  Patient self care activities - Over the next 180 days, patient will: o Check BP as needed, document, and provide at future appointments o Ensure daily salt intake < 2300 mg/day  Hyperlipidemia / Cardiovascular risk reduction Lab Results  Component Value Date/Time   LDLCALC 87 03/29/2019 09:18 AM   Pharmacist Clinical Goal(s): o Over the next 180 days, patient will work with PharmD and providers to maintain LDL goal < 100  Current regimen:  o Rosuvastatin 10 mg daily o Aspirin 81 mg daily  Interventions: o Discussed cholesterol goals and benefits of medication for prevention of heart attack / stroke  Patient self care activities - Over the next 180 days, patient will: o Continue medication as prescribed o Continue low cholesterol diet and exercise routine  Medication management  Pharmacist Clinical Goal(s): o Over the next 180 days, patient will work with PharmD and providers to maintain optimal medication  adherence  Current pharmacy: [facility]  Interventions o Comprehensive medication review performed. o Continue current medication management strategy  Patient self care activities - Over the next 180 days, patient will: o Focus on medication adherence by fill date o Take medications as prescribed o Report any questions or concerns to PharmD and/or provider(s)  Initial goal documentation       Cory Vasquez was given information about Chronic Care Management services today including:  1. CCM service includes personalized support from designated clinical staff supervised by his physician, including individualized plan of care and coordination with other care providers 2. 24/7 contact phone numbers for assistance for urgent and routine care needs. 3. Standard insurance, coinsurance, copays and deductibles apply for chronic care management only during months in which we provide at least 20 minutes of these services. Most insurances cover these services at 100%, however patients may be responsible for any copay, coinsurance and/or deductible if applicable. This service may help you avoid the need for more expensive face-to-face services. 4. Only one practitioner may furnish and bill the service in a calendar month. 5. The patient may stop CCM services at any time (effective at the end of the month) by phone call to the office staff.  Patient agreed to services and verbal consent obtained.   Patient verbalizes understanding of instructions provided today.  Telephone follow up appointment with pharmacy team member scheduled for: 6 months  [name], PharmD Clinical Pharmacist [facility] [place] Maintenance After Age 41 After age 41, you  are at a higher risk for certain long-term diseases and infections as well as injuries from falls. Falls are a major cause of broken bones and head injuries in people who are older than age 50. Getting regular  preventive care can help to keep you healthy and well. Preventive care includes getting regular testing and making lifestyle changes as recommended by your health care provider. Talk with your health care provider about:  Which screenings and tests you should have. A screening is a test that checks for a disease when you have no symptoms.  A diet and exercise plan that is right for you. What should I know about screenings and tests to prevent falls? Screening and testing are the best ways to find a health problem early. Early diagnosis and treatment give you the best chance of managing medical conditions that are common after age 14. Certain conditions and lifestyle choices may make you more likely to have a fall. Your health care provider may recommend:  Regular vision checks. Poor vision and conditions such as cataracts can make you more likely to have a fall. If you wear glasses, make sure to get your prescription updated if your vision changes.  Medicine review. Work with your health care provider to regularly review all of the medicines you are taking, including over-the-counter medicines. Ask your health care provider about any side effects that may make you more likely to have a fall. Tell your health care provider if any medicines that you take make you feel dizzy or sleepy.  Osteoporosis screening. Osteoporosis is a condition that causes the bones to get weaker. This can make the bones weak and cause them to break more easily.  Blood pressure screening. Blood pressure changes and medicines to control blood pressure can make you feel dizzy.  Strength and balance checks. Your health care provider may recommend certain tests to check your strength and balance while standing, walking, or changing positions.  Foot health exam. Foot pain and numbness, as well as not wearing proper footwear, can make you more likely to have a fall.  Depression screening. You may be more likely to have a fall if  you have a fear of falling, feel emotionally low, or feel unable to do activities that you used to do.  Alcohol use screening. Using too much alcohol can affect your balance and may make you more likely to have a fall. What actions can I take to lower my risk of falls? General instructions  Talk with your health care provider about your risks for falling. Tell your health care provider if: ? You fall. Be sure to tell your health care provider about all falls, even ones that seem minor. ? You feel dizzy, sleepy, or off-balance.  Take over-the-counter and prescription medicines only as told by your health care provider. These include any supplements.  Eat a healthy diet and maintain a healthy weight. A healthy diet includes low-fat dairy products, low-fat (lean) meats, and fiber from whole grains, beans, and lots of fruits and vegetables. Home safety  Remove any tripping hazards, such as rugs, cords, and clutter.  Install safety equipment such as grab bars in bathrooms and safety rails on stairs.  Keep rooms and walkways well-lit. Activity   Follow a regular exercise program to stay fit. This will help you maintain your balance. Ask your health care provider what types of exercise are appropriate for you.  If you need a cane or walker, use it as recommended by your health  care provider.  Wear supportive shoes that have nonskid soles. Lifestyle  Do not drink alcohol if your health care provider tells you not to drink.  If you drink alcohol, limit how much you have: ? 0-1 drink a day for women. ? 0-2 drinks a day for men.  Be aware of how much alcohol is in your drink. In the [place] one drink equals one typical bottle of beer (12 oz), one-half glass of wine (5 oz), or one shot of hard liquor (1 oz).  Do not use any products that contain nicotine or tobacco, such as cigarettes and e-cigarettes. If you need help quitting, ask your health care provider. Summary  Having a healthy  lifestyle and getting preventive care can help to protect your health and wellness after age 77.  Screening and testing are the best way to find a health problem early and help you avoid having a fall. Early diagnosis and treatment give you the best chance for managing medical conditions that are more common for people who are older than age 96.  Falls are a major cause of broken bones and head injuries in people who are older than age 81. Take precautions to prevent a fall at home.  Work with your health care provider to learn what changes you can make to improve your health and wellness and to prevent falls. This information is not intended to replace advice given to you by your health care provider. Make sure you discuss any questions you have with your health care provider. Document Revised: 11/03/2018 Document Reviewed: 05/26/2017 Elsevier Patient Education  2020 [employer].

## 2020-02-21 NOTE — Chronic Care Management (AMB) (Signed)
Chronic Care Management Pharmacy  Name: Cory Vasquez  MRN: [id number] DOB: 1946-04-29   Chief Complaint/ HPI  Cory Vasquez,  74 y.o. , male presents for their Initial CCM visit with the clinical pharmacist via telephone due to COVID-19 Pandemic.  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 He walks 3.5 miles per day.  Office Visits: 01/24/20 Dr Ronnald Ramp OV: acute visit for neck pain, tx'd with Toradol injection and steroid course, rec'd MRI of spine to further evaluate for surgical intervention  Consult Visit: 09/29/19 Dr Jeffie Pollock (urology): f/u via claims. 09/26/19 Dr Ronnald Ramp (dermatology): f/u actinic keratosis, hx skin cancer 08/28/19 Dr Stanford Breed (cardiology): SVT evaluation, no recurrences over 3 years. Continue metoprolol PRN. Monitor pedal edema with amlodipine.  No Known Allergies  Medications: Outpatient Encounter Medications as of 02/21/2020  Medication Sig  . amLODipine (NORVASC) 10 MG tablet Take 1 tablet (10 mg total) by mouth daily.  Marland Kitchen aspirin (BAYER LOW STRENGTH) 81 MG EC tablet Take 81 mg by mouth daily.    . furosemide (LASIX) 20 MG tablet Take 1 tablet (20 mg total) by mouth daily.  . Multiple Vitamin (MULTIVITAMIN WITH MINERALS) TABS tablet Take 1 tablet by mouth daily.  . rosuvastatin (CRESTOR) 10 MG tablet TAKE 1 TABLET(10 MG) BY MOUTH DAILY  . sildenafil (REVATIO) 20 MG tablet Take 20 mg by mouth as needed.  . Nabumetone (RELAFEN DS) 1000 MG TABS Take 1 tablet by mouth daily as needed. (Patient not taking: Reported on 02/21/2020)  . [DISCONTINUED] rosuvastatin (CRESTOR) 10 MG tablet Take 1 tablet (10 mg total) by mouth daily.   No  facility-administered encounter medications on file as of 02/21/2020.     Current Diagnosis/Assessment:  SDOH Interventions     Most Recent Value  SDOH Interventions  Financial Strain Interventions Intervention Not Indicated      Goals Addressed            This Visit's Progress   . Pharmacy Care Plan       CARE PLAN ENTRY (see longitudinal plan of care for additional care plan information)  Current Barriers:  . Chronic Disease Management support, education, and care coordination needs related to Hypertension and Hyperlipidemia   Hypertension BP Readings from Last 3 Encounters:  01/24/20 136/76  08/28/19 125/71  04/20/19 126/78 .  Pharmacist Clinical Goal(s): o Over the next 180 days, patient will work with PharmD and providers to achieve BP goal <130/80 . Current regimen:  o Amlodipine 10 mg daily o Furosemide 20 mg daily . Interventions: o Discussed BP goals and benefits of medication for prevention of heart attack / stroke . Patient self care activities - Over the next 180 days, patient will: o Check BP as needed, document, and provide at future appointments o Ensure daily salt intake < 2300 mg/day  Hyperlipidemia / Cardiovascular risk reduction Lab Results  Component Value Date/Time   LDLCALC 87 03/29/2019 09:18 AM .  Pharmacist Clinical Goal(s): o Over the next 180 days, patient will work with PharmD and providers to maintain LDL goal < 100 . Current regimen:  o Rosuvastatin 10 mg daily o Aspirin 81 mg daily . Interventions: o Discussed cholesterol goals and benefits of medication  for prevention of heart attack / stroke . Patient self care activities - Over the next 180 days, patient will: o Continue medication as prescribed o Continue low cholesterol diet and exercise routine  Medication management . Pharmacist Clinical Goal(s): o Over the next 180 days, patient will work with PharmD and providers to maintain optimal medication adherence . Current  pharmacy: [facility] . Interventions o Comprehensive medication review performed. o Continue current medication management strategy . Patient self care activities - Over the next 180 days, patient will: o Focus on medication adherence by fill date o Take medications as prescribed o Report any questions or concerns to PharmD and/or provider(s)  Initial goal documentation       Hypertension   BP goal is:  <130/80   Office blood pressures are  BP Readings from Last 3 Encounters:  01/24/20 136/76  08/28/19 125/71  04/20/19 126/78   Patient checks BP at home infrequently Patient home BP readings are ranging: n/a  Patient has failed these meds in the past: metoprolol Patient is currently controlled on the following medications:  . Amlodipine 10 mg daily - AM . Furosemide 20 mg daily - AM  We discussed diet and exercise extensively; BP goals; benefits of medications  Plan  Continue current medications and control with diet and exercise   Hyperlipidemia   LDL goal < 100  Lipid Panel     Component Value Date/Time   CHOL 162 03/29/2019 0918   TRIG 106.0 03/29/2019 0918   HDL 53.70 03/29/2019 0918   LDLCALC 87 03/29/2019 0918    Hepatic Function Latest Ref Rng & Units 03/29/2019 03/29/2018 03/09/2017  Total Protein 6.0 - 8.3 g/dL 7.1 6.9 6.6  Albumin 3.5 - 5.2 g/dL 3.9 3.9 4.2  AST 0 - 37 U/L _0 ALT 0 - 53 U/L _1 Alk Phosphatase 39 - 117 U/L 96 63 73  Total Bilirubin 0.2 - 1.2 mg/dL 0.6 0.7 0.6  Bilirubin, Direct 0.0 - 0.3 mg/dL 0.1 0.1 0.1    The 10-year ASCVD risk score Mikey Bussing DC Jr., et al., 2013) is: 23.3%   Values used to calculate the score:     Age: 70 years     Sex: Male     Is Non-Hispanic African American: No     Diabetic: No     Tobacco smoker: No     Systolic Blood Pressure: 122 mmHg     Is BP treated: Yes     HDL Cholesterol: 53.7 mg/dL     Total Cholesterol: 162 mg/dL   Patient has failed these meds in past: n/a Patient is currently  controlled on the following medications:  . Rosuvastatin 10 mg daily - AM . Aspirin 81 mg daily  We discussed:  diet and exercise extensively; cholesterol goals; benefits of statin for ASCVD risk reduction  Plan  Continue current medications and control with diet and exercise  Degenerative disc disease (cervical)   Patient has failed these meds in past: nabumetone Patient is currently controlled on the following medications:  . Ibuprofen 200 mg PRN  We discussed:  Pt reports flare has completely resolved after steroid and nabumetone course earlier this month. Pt typically uses ibuprofen for mild pain as needed.  Plan  Continue current medications  ED   Patient has failed these meds in past: n/a Patient is currently controlled on the following medications:  . Sildenafil 20 mg - 100 mg PRN  We discussed:  Pt uses up to 100  mg every few weeks. Denies BP issues.   Plan  Continue current medications  Health Maintenance   Patient is currently controlled on the following medications:  Marland Kitchen Multivitamin  We discussed:  Patient is satisfied with current OTC regimen and denies issues  Plan  Continue current medications  Medication Management   Pt uses [place] for all medications Uses pill box? No - prefers bottles Pt endorses 100% compliance  We discussed: [facility] is a preferred pharmacy and all medications are covered. Pt denies issues with pharmacy services.  Plan  Continue current medication management strategy    Follow up: 6 month phone visit  [name], PharmD, [facility] Clinical Pharmacist [facility] [phone]

## 2020-02-23 NOTE — Addendum Note (Signed)
Addended by: Aviva Signs M on: 02/23/2020 02:15 PM   Modules accepted: Orders

## 2020-02-24 ENCOUNTER — Other Ambulatory Visit: Payer: Medicare Other

## 2020-03-18 ENCOUNTER — Other Ambulatory Visit: Payer: Self-pay | Admitting: Internal Medicine

## 2020-03-18 DIAGNOSIS — E785 Hyperlipidemia, unspecified: Secondary | ICD-10-CM

## 2020-03-19 ENCOUNTER — Ambulatory Visit: Payer: Medicare Other

## 2020-03-26 ENCOUNTER — Ambulatory Visit: Payer: Medicare Other

## 2020-03-28 ENCOUNTER — Ambulatory Visit (INDEPENDENT_AMBULATORY_CARE_PROVIDER_SITE_OTHER): Payer: Medicare Other

## 2020-03-28 ENCOUNTER — Other Ambulatory Visit: Payer: Self-pay

## 2020-03-28 ENCOUNTER — Encounter: Payer: Self-pay | Admitting: Internal Medicine

## 2020-03-28 ENCOUNTER — Ambulatory Visit (INDEPENDENT_AMBULATORY_CARE_PROVIDER_SITE_OTHER): Payer: Medicare Other | Admitting: Internal Medicine

## 2020-03-28 VITALS — BP 116/70 | HR 77 | Temp 98.3°F | Resp 16 | Ht 72.0 in | Wt 192.0 lb

## 2020-03-28 DIAGNOSIS — R05 Cough: Secondary | ICD-10-CM

## 2020-03-28 DIAGNOSIS — Z1159 Encounter for screening for other viral diseases: Secondary | ICD-10-CM | POA: Diagnosis not present

## 2020-03-28 DIAGNOSIS — I10 Essential (primary) hypertension: Secondary | ICD-10-CM | POA: Diagnosis not present

## 2020-03-28 DIAGNOSIS — Z23 Encounter for immunization: Secondary | ICD-10-CM | POA: Diagnosis not present

## 2020-03-28 DIAGNOSIS — Z0000 Encounter for general adult medical examination without abnormal findings: Secondary | ICD-10-CM

## 2020-03-28 DIAGNOSIS — R739 Hyperglycemia, unspecified: Secondary | ICD-10-CM | POA: Diagnosis not present

## 2020-03-28 DIAGNOSIS — E785 Hyperlipidemia, unspecified: Secondary | ICD-10-CM

## 2020-03-28 DIAGNOSIS — R058 Other specified cough: Secondary | ICD-10-CM

## 2020-03-28 NOTE — Patient Instructions (Signed)

## 2020-03-28 NOTE — Progress Notes (Signed)
Subjective:  Patient ID: Cory Vasquez, male    DOB: [birth year]  Age: 74 y.o. MRN: [id number]  CC: Annual Exam, Hypertension, and Hyperlipidemia  This visit occurred during the SARS-CoV-2 public health emergency.  Safety protocols were in place, including screening questions prior to the visit, additional usage of staff PPE, and extensive cleaning of exam room while observing appropriate contact time as indicated for disinfecting solutions.    HPI [name] presents for a CPX.  He walks 3 to 4 miles a day.  He does not experience chest pain, shortness of breath, palpitations, edema, or fatigue.  He has a chronic, intermittent nonproductive cough and wants to do a  chest x-ray.  Outpatient Medications Prior to Visit  Medication Sig Dispense Refill  . amLODipine (NORVASC) 10 MG tablet Take 1 tablet (10 mg total) by mouth daily. 30 tablet 11  . aspirin (BAYER LOW STRENGTH) 81 MG EC tablet Take 81 mg by mouth daily.      . furosemide (LASIX) 20 MG tablet Take 1 tablet (20 mg total) by mouth daily. 30 tablet 11  . Multiple Vitamin (MULTIVITAMIN WITH MINERALS) TABS tablet Take 1 tablet by mouth daily.    . rosuvastatin (CRESTOR) 10 MG tablet TAKE 1 TABLET(10 MG) BY MOUTH DAILY 90 tablet 1  . sildenafil (REVATIO) 20 MG tablet Take 20 mg by mouth as needed.    . Nabumetone (RELAFEN DS) 1000 MG TABS Take 1 tablet by mouth daily as needed. 10 tablet 0   No facility-administered medications prior to visit.    ROS Review of Systems  Constitutional: Negative.  Negative for appetite change, diaphoresis, fatigue and unexpected weight change.  HENT: Negative.  Negative for sore throat and trouble swallowing.   Respiratory: Positive for cough. Negative for chest tightness, shortness of breath and wheezing.        + rare NP cough  Cardiovascular: Negative for chest pain, palpitations and leg swelling.  Gastrointestinal: Negative for abdominal pain, blood in stool, constipation, diarrhea,  nausea and vomiting.  Endocrine: Negative.   Genitourinary: Negative.  Negative for difficulty urinating and dysuria.  Musculoskeletal: Negative.  Negative for arthralgias and myalgias.  Skin: Negative.  Negative for color change and pallor.  Neurological: Negative.  Negative for dizziness, weakness, light-headedness and headaches.  Hematological: Negative for adenopathy. Does not bruise/bleed easily.  Psychiatric/Behavioral: Negative.     Objective:  BP 116/70   Pulse 77   Temp 98.3 F (36.8 C) (Oral)   Resp 16   Ht 6' (1.829 m)   Wt 192 lb (87.1 kg)   SpO2 98%   BMI 26.04 kg/m   BP Readings from Last 3 Encounters:  03/28/20 116/70  01/24/20 136/76  08/28/19 125/71    Wt Readings from Last 3 Encounters:  03/28/20 192 lb (87.1 kg)  01/24/20 194 lb 4 oz (88.1 kg)  08/28/19 202 lb (91.6 kg)    Physical Exam Vitals reviewed.  Constitutional:      Appearance: Normal appearance.  HENT:     Nose: Nose normal.     Mouth/Throat:     Mouth: Mucous membranes are moist.  Eyes:     General: No scleral icterus.    Conjunctiva/sclera: Conjunctivae normal.  Cardiovascular:     Rate and Rhythm: Normal rate and regular rhythm.     Heart sounds: No murmur heard.      Comments: EKG - NSR, 68 bpm Low voltage in III and aVF - no change from the prior  EKG Otherwise normal EKG Pulmonary:     Effort: Pulmonary effort is normal.     Breath sounds: No stridor. No wheezing, rhonchi or rales.  Abdominal:     General: Abdomen is flat. Bowel sounds are normal. There is no distension.     Palpations: Abdomen is soft. There is no hepatomegaly, splenomegaly or mass.     Tenderness: There is no abdominal tenderness.  Musculoskeletal:        General: Normal range of motion.     Cervical back: Neck supple.     Right lower leg: No edema.     Left lower leg: No edema.  Lymphadenopathy:     Cervical: No cervical adenopathy.  Skin:    General: Skin is warm.     Coloration: Skin is not  jaundiced.  Neurological:     General: No focal deficit present.     Mental Status: He is alert.  Psychiatric:        Mood and Affect: Mood normal.        Behavior: Behavior normal.     Lab Results  Component Value Date   WBC 10.3 01/24/2020   HGB 14.7 01/24/2020   HCT 42.8 01/24/2020   PLT 261.0 01/24/2020   GLUCOSE 143 (H) 03/29/2019   CHOL 162 03/29/2019   TRIG 106.0 03/29/2019   HDL 53.70 03/29/2019   LDLCALC 87 03/29/2019   ALT 16 03/29/2019   AST 19 03/29/2019   NA 138 03/29/2019   K 3.6 03/29/2019   CL 104 03/29/2019   CREATININE 1.04 03/29/2019   BUN 22 03/29/2019   CO2 26 03/29/2019   TSH 1.12 03/29/2019   PSA 1.54 01/17/2020   INR 0.9 05/31/2007   HGBA1C 5.6 03/30/2019    MR PROSTATE W WO CONTRAST  Result Date: 09/20/2019 CLINICAL DATA:  History of prostate cancer and elevated PSA. Biopsy in 2017. EXAM: MR PROSTATE WITHOUT AND WITH CONTRAST TECHNIQUE: Multiplanar multisequence MRI images were obtained of the pelvis centered about the prostate. Pre and post contrast images were obtained. CONTRAST:  35mL MULTIHANCE GADOBENATE DIMEGLUMINE 529 MG/ML IV SOLN Creatinine was obtained on site at [facility] at [address] Results: Creatinine 0.9 mg/dL. COMPARISON:  None FINDINGS: Prostate: Diffuse T2 hypointensity about the posterior peripheral zone without corresponding restricted diffusion beyond mildly heterogeneous low signal on ADC. No signs of high B value restricted diffusion within the prostate. Area appearing slightly more focal on the left at the left base (image 15 of series 5), coronal images just below the central zone transition. There is some motion which limits assessment in addition to bowel gas. There is some mild asymmetry of enhancement within this area best seen on series 28, subtraction images. Mild BPH changes in the transitional zone. Volume: 40 cc Transcapsular spread:  Absent Seminal vesicle involvement: Absent Neurovascular bundle  involvement: Absent Pelvic adenopathy: Absent Bone metastasis: Absent Other findings: Colonic diverticulosis. IMPRESSION: Signs of prostatitis and BPH. Asymmetry of low signal on T2 with limited diffusion and slight asymmetry of enhancement favoring left over right base with diffuse hypointensity in the peripheral zone midgland. Area in the left base to midgland assigned PIRADS category 3 in light of limited diffusion and other findings outlined above. This could also represent more profound changes of prostatitis. No signs of extracapsular disease or additional lesion of concern. Electronically Signed   By: Zetta Bills M.D.   On: 09/20/2019 10:59   DG Chest 2 View  Result Date: 03/28/2020 CLINICAL DATA:  Cough. EXAM: CHEST - 2 VIEW COMPARISON:  April 20, 2019. FINDINGS: The heart size and mediastinal contours are within normal limits. Both lungs are clear. The visualized skeletal structures are unremarkable. IMPRESSION: No active cardiopulmonary disease. Electronically Signed   By: Marijo Conception M.D.   On: 03/28/2020 17:06    Assessment & Plan:   Jaun was seen today for annual exam, hypertension and hyperlipidemia.  Diagnoses and all orders for this visit:  Essential hypertension- His blood pressure is adequately well controlled.  His EKG is reassuring.  Electrolytes and renal function are normal. -     BASIC METABOLIC PANEL WITH GFR; Future -     EKG 12-Lead  Dyslipidemia, goal LDL below 130- He has achieved his LDL goal and is doing well on the statin. -     Lipid panel; Future -     Hepatic function panel; Future  Routine general medical examination at a health care facility- Exam completed, labs reviewed, vaccines reviewed and updated, cancer screenings are up-to-date, patient education was given.  Hyperglycemia- His A1C is normal -     Hemoglobin A1c; Future -     BASIC METABOLIC PANEL WITH GFR; Future  Need for hepatitis C screening test -     Hepatitis C antibody;  Future  Upper airway cough syndrome- His chest x-ray remains normal.  There are no complications related to this. -     DG Chest 2 View; Future  Other orders -     Flu Vaccine QUAD 6+ mos PF IM (Fluarix Quad PF)   I have discontinued Cristopher Estimable. [place]'s Relafen DS. I am also having him maintain his aspirin, multivitamin with minerals, sildenafil, furosemide, amLODipine, and rosuvastatin.  No orders of the defined types were placed in this encounter.    Follow-up: Return in about 6 months (around 09/25/2020).  Scarlette Calico, MD

## 2020-03-29 LAB — BASIC METABOLIC PANEL WITH GFR
BUN: 11 mg/dL (ref 7–25)
CO2: 29 mmol/L (ref 20–32)
Calcium: 9.9 mg/dL (ref 8.6–10.3)
Chloride: 104 mmol/L (ref 98–110)
Creat: 1.01 mg/dL (ref 0.70–1.18)
GFR, Est African American: 85 mL/min/{1.73_m2} (ref >=60)
GFR, Est Non African American: 73 mL/min/{1.73_m2} (ref >=60)
Glucose, Bld: 88 mg/dL (ref 65–99)
Potassium: 5 mmol/L (ref 3.5–5.3)
Sodium: 143 mmol/L (ref 135–146)

## 2020-03-29 LAB — HEPATITIS C ANTIBODY
Hepatitis C Ab: NONREACTIVE
SIGNAL TO CUT-OFF: 0.02 (ref <1.00)

## 2020-03-29 LAB — HEMOGLOBIN A1C
Hgb A1c MFr Bld: 5.6 % of total Hgb (ref <5.7)
Mean Plasma Glucose: 114 (calc)
eAG (mmol/L): 6.3 (calc)

## 2020-03-29 LAB — LIPID PANEL
Cholesterol: 140 mg/dL (ref <200)
HDL: 62 mg/dL (ref >=40)
LDL Cholesterol (Calc): 59 mg/dL (calc)
Non-HDL Cholesterol (Calc): 78 mg/dL (calc) (ref <130)
Total CHOL/HDL Ratio: 2.3 (calc) (ref <5.0)
Triglycerides: 105 mg/dL (ref <150)

## 2020-03-29 LAB — HEPATIC FUNCTION PANEL
AG Ratio: 1.5 (calc) (ref 1.0–2.5)
ALT: 18 U/L (ref 9–46)
AST: 23 U/L (ref 10–35)
Albumin: 4.1 g/dL (ref 3.6–5.1)
Alkaline phosphatase (APISO): 103 U/L (ref 35–144)
Bilirubin, Direct: 0.1 mg/dL (ref 0.0–0.2)
Globulin: 2.8 g/dL (calc) (ref 1.9–3.7)
Indirect Bilirubin: 0.3 mg/dL (calc) (ref 0.2–1.2)
Total Bilirubin: 0.4 mg/dL (ref 0.2–1.2)
Total Protein: 6.9 g/dL (ref 6.1–8.1)

## 2020-05-26 ENCOUNTER — Other Ambulatory Visit: Payer: Self-pay | Admitting: Internal Medicine

## 2020-05-27 ENCOUNTER — Other Ambulatory Visit: Payer: Self-pay | Admitting: Internal Medicine

## 2020-05-29 DIAGNOSIS — C4442 Squamous cell carcinoma of skin of scalp and neck: Secondary | ICD-10-CM | POA: Diagnosis not present

## 2020-06-11 ENCOUNTER — Telehealth: Payer: Self-pay | Admitting: Internal Medicine

## 2020-06-11 NOTE — Telephone Encounter (Signed)
1.Medication Requested: amLODipine (NORVASC) 10 MG tablet furosemide (LASIX) 20 MG tablet rosuvastatin (CRESTOR) 10 MG tablet 2. Pharmacy (Name, Street, [place]):  [facility] - [address], [place] AT [address] Phone:  [phone]  Fax:  [phone]      3. On Med List: yes  4. Last Visit with PCP: 09.02.21  5. Next visit date with PCP: N/A  Requesting a 90 day supply of each   .

## 2020-06-12 ENCOUNTER — Other Ambulatory Visit: Payer: Self-pay | Admitting: Internal Medicine

## 2020-06-12 DIAGNOSIS — E785 Hyperlipidemia, unspecified: Secondary | ICD-10-CM

## 2020-06-12 DIAGNOSIS — I10 Essential (primary) hypertension: Secondary | ICD-10-CM

## 2020-06-12 MED ORDER — FUROSEMIDE 20 MG PO TABS: 20.0000 mg | ORAL_TABLET | Freq: Every day | ORAL | 1 refills | Status: DC

## 2020-06-12 MED ORDER — ROSUVASTATIN CALCIUM 10 MG PO TABS: ORAL_TABLET | ORAL | 1 refills | Status: DC

## 2020-06-12 MED ORDER — AMLODIPINE BESYLATE 10 MG PO TABS: 10.0000 mg | ORAL_TABLET | Freq: Every day | ORAL | 1 refills | Status: DC

## 2020-08-06 DIAGNOSIS — L578 Other skin changes due to chronic exposure to nonionizing radiation: Secondary | ICD-10-CM | POA: Diagnosis not present

## 2020-08-06 DIAGNOSIS — Z85828 Personal history of other malignant neoplasm of skin: Secondary | ICD-10-CM | POA: Diagnosis not present

## 2020-08-15 ENCOUNTER — Telehealth: Payer: Medicare Other

## 2020-09-07 ENCOUNTER — Other Ambulatory Visit: Payer: Self-pay | Admitting: Internal Medicine

## 2020-09-18 ENCOUNTER — Telehealth: Payer: Self-pay | Admitting: Pharmacist

## 2020-09-18 NOTE — Chronic Care Management (AMB) (Signed)
    Chronic Care Management Pharmacy Assistant   Name: Cory Vasquez  MRN: [id number] DOB: Apr 23, 1946  Reason for Encounter: Disease State/General Adherence Call  PCP : Cory Lima, MD  Allergies:  No Known Allergies  Medications: Outpatient Encounter Medications as of 09/18/2020  Medication Sig  . amLODipine (NORVASC) 10 MG tablet Take 1 tablet (10 mg total) by mouth daily.  Marland Kitchen aspirin (BAYER LOW STRENGTH) 81 MG EC tablet Take 81 mg by mouth daily.    . furosemide (LASIX) 20 MG tablet Take 1 tablet (20 mg total) by mouth daily.  . Multiple Vitamin (MULTIVITAMIN WITH MINERALS) TABS tablet Take 1 tablet by mouth daily.  . rosuvastatin (CRESTOR) 10 MG tablet TAKE 1 TABLET(10 MG) BY MOUTH DAILY  . sildenafil (REVATIO) 20 MG tablet Take 20 mg by mouth as needed.   No facility-administered encounter medications on file as of 09/18/2020.    Current Diagnosis: Patient Active Problem List   Diagnosis Date Noted  . Need for hepatitis C screening test 03/28/2020  . Degenerative disc disease, cervical 01/24/2020  . Colon cancer screening 03/29/2019  . Hyperglycemia 03/29/2019  . Upper airway cough syndrome 03/25/2018  . Routine general medical examination at a health care facility 03/27/2015  . Chronic rhinitis 03/22/2014  . Prostate nodule without urinary obstruction dx prostate ca/ limited 02/2014  03/06/2013  . Dyslipidemia, goal LDL below 130 06/14/2007  . Essential hypertension 06/14/2007  . Diverticulosis of colon without hemorrhage 06/14/2007    Have you seen any other providers since your last visit with Cory Vasquez, CPP?  03/28/2020 OV (PCP) Dr. Ronnald Vasquez; annual exam and chronic f/u, HTN well controlled, EKG reassuring, LDL at goal on statin, A1C normal, discontinued Relafen DS, f/u 6 months  Have you had any problems recently with your health?  Patient states he has not had any problems recently with his health.  Have you had any problems with your  pharmacy?  Patient states he has not had any problems recently with his pharmacy.  What issues or side effects are you having with your medications?  Patient states he is not currently having any issues or side effects from any of his medications.  What would you like me to pass along to [facility], CPP for her to help you with?   Patient states he doesn't have anything to pass along at this time.  What can we do to take care of you better?  Patient states he doesn't need anything at this time.  Cory Vasquez, [place] Pharmacist Assistant [phone]   Total time spent: 30 minutes  Follow-Up:  Pharmacist Review

## 2020-10-07 DIAGNOSIS — Z85828 Personal history of other malignant neoplasm of skin: Secondary | ICD-10-CM | POA: Diagnosis not present

## 2020-10-07 DIAGNOSIS — L570 Actinic keratosis: Secondary | ICD-10-CM | POA: Diagnosis not present

## 2020-10-07 DIAGNOSIS — L821 Other seborrheic keratosis: Secondary | ICD-10-CM | POA: Diagnosis not present

## 2020-12-06 ENCOUNTER — Other Ambulatory Visit: Payer: Self-pay | Admitting: Internal Medicine

## 2020-12-06 DIAGNOSIS — E785 Hyperlipidemia, unspecified: Secondary | ICD-10-CM

## 2020-12-06 DIAGNOSIS — I10 Essential (primary) hypertension: Secondary | ICD-10-CM

## 2020-12-18 ENCOUNTER — Telehealth: Payer: Self-pay | Admitting: Internal Medicine

## 2020-12-18 ENCOUNTER — Other Ambulatory Visit: Payer: Self-pay | Admitting: Internal Medicine

## 2020-12-18 DIAGNOSIS — E785 Hyperlipidemia, unspecified: Secondary | ICD-10-CM

## 2020-12-18 DIAGNOSIS — I10 Essential (primary) hypertension: Secondary | ICD-10-CM

## 2020-12-18 NOTE — Telephone Encounter (Signed)
Patient has called the office asking why his medication refill was denied. Patient was informed he needs to be seen in office but has decided to call cardiologist instead.

## 2020-12-18 NOTE — Telephone Encounter (Signed)
Noted  

## 2020-12-25 ENCOUNTER — Ambulatory Visit (INDEPENDENT_AMBULATORY_CARE_PROVIDER_SITE_OTHER): Payer: Medicare Other | Admitting: Internal Medicine

## 2020-12-25 ENCOUNTER — Encounter: Payer: Self-pay | Admitting: Internal Medicine

## 2020-12-25 ENCOUNTER — Other Ambulatory Visit: Payer: Self-pay

## 2020-12-25 VITALS — BP 130/72 | HR 68 | Temp 98.0°F | Ht 72.0 in | Wt 189.0 lb

## 2020-12-25 DIAGNOSIS — I10 Essential (primary) hypertension: Secondary | ICD-10-CM | POA: Diagnosis not present

## 2020-12-25 DIAGNOSIS — R739 Hyperglycemia, unspecified: Secondary | ICD-10-CM | POA: Diagnosis not present

## 2020-12-25 DIAGNOSIS — E785 Hyperlipidemia, unspecified: Secondary | ICD-10-CM | POA: Diagnosis not present

## 2020-12-25 MED ORDER — FUROSEMIDE 20 MG PO TABS: 20.0000 mg | ORAL_TABLET | Freq: Every day | ORAL | 1 refills | Status: DC

## 2020-12-25 MED ORDER — ROSUVASTATIN CALCIUM 10 MG PO TABS: ORAL_TABLET | ORAL | 1 refills | Status: DC

## 2020-12-25 MED ORDER — AMLODIPINE BESYLATE 10 MG PO TABS: 10.0000 mg | ORAL_TABLET | Freq: Every day | ORAL | 1 refills | Status: DC

## 2020-12-25 NOTE — Progress Notes (Signed)
Patient ID: Cory Vasquez, male   DOB: [birth year], 75 y.o.   MRN: [id number]        Chief Complaint: follow up HTN, HLD and hyperglycemia as PCP out of office today       HPI:  Cory Vasquez is a 75 y.o. male here overall doing well.  Pt denies chest pain, increased sob or doe, wheezing, orthopnea, PND, increased LE swelling, palpitations, dizziness or syncope.   Pt denies polydipsia, polyuria, or new focal neuro s/s.   Pt denies fever, wt loss, night sweats, loss of appetite, or other constitutional symptoms       Wt Readings from Last 3 Encounters:  12/25/20 189 lb (85.7 kg)  03/28/20 192 lb (87.1 kg)  01/24/20 194 lb 4 oz (88.1 kg)   BP Readings from Last 3 Encounters:  12/25/20 130/72  03/28/20 116/70  01/24/20 136/76         Past Medical History:  Diagnosis Date  . Diverticulosis    see colonoscopy 5/06 rec fu 11/2014  . HBP (high blood pressure)    ACE d/c'd 11/02/08. cough/sorethroat > resolved  . Health maintenance examination    [name]. Td June 30/2011. CPX June 30/2011  . Hyperlipidemia   . Palpitations    neg myoview 11/05 with EF 58%  . Prostate cancer ([place])   . SVT (supraventricular tachycardia) ([facility])    Past Surgical History:  Procedure Laterality Date  . BACK SURGERY     [name] 11/08    reports that he has never smoked. He has never used smokeless tobacco. He reports current alcohol use of about 5.0 - 6.0 standard drinks of alcohol per week. He reports that he does not use drugs. family history includes Diabetes in his brother; Heart disease (age of onset: 37) in his father. No Known Allergies Current Outpatient Medications on File Prior to Visit  Medication Sig Dispense Refill  . aspirin 81 MG EC tablet Take 81 mg by mouth daily.    . Multiple Vitamin (MULTIVITAMIN WITH MINERALS) TABS tablet Take 1 tablet by mouth daily.    . sildenafil (REVATIO) 20 MG tablet Take 20 mg by mouth as needed.     No current facility-administered medications on file prior to  visit.        ROS:  All others reviewed and negative.  Objective        PE:  BP 130/72 (BP Location: Right Arm, Patient Position: Sitting, Cuff Size: Normal)   Pulse 68   Temp 98 F (36.7 C) (Oral)   Ht 6' (1.829 m)   Wt 189 lb (85.7 kg)   SpO2 98%   BMI 25.63 kg/m                 Constitutional: Pt appears in NAD               HENT: Head: NCAT.                Right Ear: External ear normal.                 Left Ear: External ear normal.                Eyes: . Pupils are equal, round, and reactive to light. Conjunctivae and EOM are normal               Nose: without d/c or deformity  Neck: Neck supple. Gross normal ROM               Cardiovascular: Normal rate and regular rhythm.                 Pulmonary/Chest: Effort normal and breath sounds without rales or wheezing.                Abd:  Soft, NT, ND, + BS, no organomegaly               Neurological: Pt is alert. At baseline orientation, motor grossly intact               Skin: Skin is warm. No rashes, no other new lesions, LE edema - none               Psychiatric: Pt behavior is normal without agitation   Micro: none  Cardiac tracings I have personally interpreted today:  none  Pertinent Radiological findings (summarize): none   Lab Results  Component Value Date   WBC 10.3 01/24/2020   HGB 14.7 01/24/2020   HCT 42.8 01/24/2020   PLT 261.0 01/24/2020   GLUCOSE 88 03/28/2020   CHOL 140 03/28/2020   TRIG 105 03/28/2020   HDL 62 03/28/2020   LDLCALC 59 03/28/2020   ALT 18 03/28/2020   AST 23 03/28/2020   NA 143 03/28/2020   K 5.0 03/28/2020   CL 104 03/28/2020   CREATININE 1.01 03/28/2020   BUN 11 03/28/2020   CO2 29 03/28/2020   TSH 1.12 03/29/2019   PSA 1.54 01/17/2020   INR 0.9 05/31/2007   HGBA1C 5.6 03/28/2020   Assessment/Plan:  Cory Vasquez is a 75 y.o. White or Caucasian [1] male with  has a past medical history of Diverticulosis, HBP (high blood pressure), Health maintenance  examination, Hyperlipidemia, Palpitations, Prostate cancer ([place]), and SVT (supraventricular tachycardia) ([place]).  Dyslipidemia, goal LDL below 130 Lab Results  Component Value Date   LDLCALC 59 03/28/2020   Stable, pt to continue current statin crestor 10   Essential hypertension BP Readings from Last 3 Encounters:  12/25/20 130/72  03/28/20 116/70  01/24/20 136/76   Stable, pt to continue medical treatment norvasc   Hyperglycemia Lab Results  Component Value Date   HGBA1C 5.6 03/28/2020   Stable, pt to continue current medical treatment  - diet and wt control   Followup: Return if symptoms worsen or fail to improve.  Cathlean Cower, MD 12/28/2020 10:12 PM [facility]

## 2020-12-25 NOTE — Patient Instructions (Signed)
Please continue all other medications as before, and refills have been done if requested.  Please have the pharmacy call with any other refills you may need.  Please continue your efforts at being more active, low cholesterol diet, and weight control.  Please keep your appointments with your specialists as you may have planned  Please plan to follow up with Dr Ronnald Ramp and your yearly physical in Sept 2022 as you mentioned

## 2020-12-28 ENCOUNTER — Encounter: Payer: Self-pay | Admitting: Internal Medicine

## 2020-12-28 NOTE — Assessment & Plan Note (Signed)
Lab Results  Component Value Date   LDLCALC 59 03/28/2020   Stable, pt to continue current statin crestor 10

## 2020-12-28 NOTE — Assessment & Plan Note (Signed)
BP Readings from Last 3 Encounters:  12/25/20 130/72  03/28/20 116/70  01/24/20 136/76   Stable, pt to continue medical treatment norvasc

## 2020-12-28 NOTE — Assessment & Plan Note (Signed)
Lab Results  Component Value Date   HGBA1C 5.6 03/28/2020   Stable, pt to continue current medical treatment  - diet and wt control

## 2021-01-13 ENCOUNTER — Other Ambulatory Visit: Payer: Self-pay

## 2021-01-13 ENCOUNTER — Ambulatory Visit (INDEPENDENT_AMBULATORY_CARE_PROVIDER_SITE_OTHER): Payer: Medicare Other | Admitting: Pharmacist

## 2021-01-13 ENCOUNTER — Telehealth: Payer: Self-pay | Admitting: Pharmacist

## 2021-01-13 DIAGNOSIS — E785 Hyperlipidemia, unspecified: Secondary | ICD-10-CM

## 2021-01-13 DIAGNOSIS — M5030 Other cervical disc degeneration, unspecified cervical region: Secondary | ICD-10-CM

## 2021-01-13 DIAGNOSIS — I10 Essential (primary) hypertension: Secondary | ICD-10-CM | POA: Diagnosis not present

## 2021-01-13 NOTE — Progress Notes (Signed)
Chronic Care Management Pharmacy Note  01/13/2021 Name:  Cory Vasquez MRN:  [id number] DOB:  1946/06/19  Summary: -Pt endorses compliance with meds and denies issues -Pt reports he got 4th covid vaccine at [facility] recently  Recommendations/Changes made from today's visit: -Advised to get Shingrix and TD booster at local pharmacy    Subjective: Cory Vasquez is an 75 y.o. year old male who is a primary patient of [name], MD.  The CCM team was consulted for assistance with disease management and care coordination needs.    Engaged with patient by telephone for follow up visit in response to provider referral for pharmacy case management and/or care coordination services.   Consent to Services:  The patient was given information about Chronic Care Management services, agreed to services, and gave verbal consent prior to initiation of services.  Please see initial visit note for detailed documentation.   Patient Care Team: [name], MD as PCP - General (Internal Medicine) Charlton Haws, [facility] as Pharmacist (Pharmacist)  Recent office visits: 12/25/20 Dr Jenny Reichmann OV: chronic f/u, meds refilled 03/28/20 Dr Ronnald Ramp OV: chronic f/u; dc'd nabumetone. Labs stable. No med changes. F/U 6 months  Recent consult visits: 08/06/20 Dr Jarome Matin (dermatology): f/u skin cancer  Hospital visits: None in previous 6 months   Objective:  Lab Results  Component Value Date   CREATININE 1.01 03/28/2020   BUN 11 03/28/2020   GFR 70.19 03/29/2019   GFRNONAA 73 03/28/2020   GFRAA 85 03/28/2020   NA 143 03/28/2020   K 5.0 03/28/2020   CALCIUM 9.9 03/28/2020   CO2 29 03/28/2020   GLUCOSE 88 03/28/2020    Lab Results  Component Value Date/Time   HGBA1C 5.6 03/28/2020 09:59 AM   HGBA1C 5.6 03/30/2019 03:07 PM   GFR 70.19 03/29/2019 09:18 AM   GFR 77.31 08/08/2018 10:02 AM    Last diabetic Eye exam: No results found for: HMDIABEYEEXA  Last diabetic Foot exam: No  results found for: HMDIABFOOTEX   Lab Results  Component Value Date   CHOL 140 03/28/2020   HDL 62 03/28/2020   LDLCALC 59 03/28/2020   TRIG 105 03/28/2020   CHOLHDL 2.3 03/28/2020    Hepatic Function Latest Ref Rng & Units 03/28/2020 03/29/2019 03/29/2018  Total Protein 6.1 - 8.1 g/dL 6.9 7.1 6.9  Albumin 3.5 - 5.2 g/dL - 3.9 3.9  AST 10 - 35 U/L _0 ALT 9 - 46 U/L _1 Alk Phosphatase 39 - 117 U/L - 96 63  Total Bilirubin 0.2 - 1.2 mg/dL 0.4 0.6 0.7  Bilirubin, Direct 0.0 - 0.2 mg/dL 0.1 0.1 0.1    Lab Results  Component Value Date/Time   TSH 1.12 03/29/2019 09:18 AM   TSH 0.95 03/29/2018 08:29 AM    CBC Latest Ref Rng & Units 01/24/2020 03/29/2019 03/29/2018  WBC 4.0 - 10.5 K/uL 10.3 8.0 5.8  Hemoglobin 13.0 - 17.0 g/dL 14.7 13.9 15.0  Hematocrit 39.0 - 52.0 % 42.8 41.4 44.1  Platelets 150.0 - 400.0 K/uL 261.0 343.0 259.0    No results found for: VD25OH  Clinical ASCVD: No  The 10-year ASCVD risk score Mikey Bussing DC Jr., et al., 2013) is: 21%   Values used to calculate the score:     Age: 65 years     Sex: Male     Is Non-Hispanic African American: No     Diabetic: No     Tobacco smoker: No  Systolic Blood Pressure: 789 mmHg     Is BP treated: Yes     HDL Cholesterol: 62 mg/dL     Total Cholesterol: 140 mg/dL    Depression screen [facility] 2/9 03/28/2020 03/28/2019  Decreased Interest 0 0  Down, Depressed, Hopeless 0 0  PHQ - 2 Score 0 0      Social History   Tobacco Use  Smoking Status Never  Smokeless Tobacco Never   BP Readings from Last 3 Encounters:  12/25/20 130/72  03/28/20 116/70  01/24/20 136/76   Pulse Readings from Last 3 Encounters:  12/25/20 68  03/28/20 77  01/24/20 77   Wt Readings from Last 3 Encounters:  12/25/20 189 lb (85.7 kg)  03/28/20 192 lb (87.1 kg)  01/24/20 194 lb 4 oz (88.1 kg)   BMI Readings from Last 3 Encounters:  12/25/20 25.63 kg/m  03/28/20 26.04 kg/m  01/24/20 26.35 kg/m    Assessment/Interventions: Review  of patient past medical history, allergies, medications, health status, including review of consultants reports, laboratory and other test data, was performed as part of comprehensive evaluation and provision of chronic care management services.   SDOH:  (Social Determinants of Health) assessments and interventions performed: Yes  SDOH Screenings   Alcohol Screen: Not on file  Depression (PHQ2-9): Low Risk    PHQ-2 Score: 0  Financial Resource Strain: Low Risk    Difficulty of Paying Living Expenses: Not hard at all  Food Insecurity: Not on file  Housing: Not on file  Physical Activity: Not on file  Social Connections: Not on file  Stress: Not on file  Tobacco Use: Low Risk    Smoking Tobacco Use: Never   Smokeless Tobacco Use: Never  Transportation Needs: Not on file    [place]  No Known Allergies  Medications Reviewed Today     Reviewed by Charlton Haws, [facility] (Pharmacist) on 01/13/21 at [place] List Status: <None>   Medication Order Taking? Sig Documenting Provider Last Dose Status Informant  amLODipine (NORVASC) 10 MG tablet [id number] Yes Take 1 tablet (10 mg total) by mouth daily. [name], MD Taking Active   aspirin 81 MG EC tablet [id number] Yes Take 81 mg by mouth daily. [provider] Taking Active Self  furosemide (LASIX) 20 MG tablet [id number] Yes Take 1 tablet (20 mg total) by mouth daily. [name], MD Taking Active   Multiple Vitamin (MULTIVITAMIN WITH MINERALS) TABS tablet [id number] Yes Take 1 tablet by mouth daily. [provider] Taking Active Self  rosuvastatin (CRESTOR) 10 MG tablet [id number] Yes TAKE 1 TABLET(10 MG) BY MOUTH DAILY [name], MD Taking Active   sildenafil (REVATIO) 20 MG tablet [id number] Yes Take 20 mg by mouth as needed. [provider] Taking Active             Patient Active Problem List   Diagnosis Date Noted   Need for hepatitis C screening test 03/28/2020   Degenerative disc  disease, cervical 01/24/2020   Colon cancer screening 03/29/2019   Hyperglycemia 03/29/2019   Upper airway cough syndrome 03/25/2018   Routine general medical examination at a health care facility 03/27/2015   Chronic rhinitis 03/22/2014   Prostate nodule without urinary obstruction dx prostate ca/ limited 02/2014  03/06/2013   Dyslipidemia, goal LDL below 130 06/14/2007   Essential hypertension 06/14/2007   Diverticulosis of colon without hemorrhage 06/14/2007    Immunization History  Administered Date(s) Administered   Fluad Quad(high Dose 65+)  03/28/2019   Influenza, High Dose Seasonal PF 03/25/2018   Influenza,inj,Quad PF,6+ Mos 03/28/2020   Moderna Sars-Covid-2 Vaccination 08/19/2019, 09/23/2019, 03/21/2020   Pneumococcal Conjugate-13 03/25/2016   Pneumococcal Polysaccharide-23 03/28/2019   Td 01/23/2010    Conditions to be addressed/monitored:  Hypertension, Hyperlipidemia, and Osteoarthritis  Care Plan : [place]  Updates made by Charlton Haws, [place] since 01/13/2021 12:00 AM     Problem: Hypertension, Hyperlipidemia, and Osteoarthritis   Priority: High     Long-Range Goal: Disease management   Start Date: 01/13/2021  Expected End Date: 01/13/2022  This Visit's Progress: On track  Priority: High  Note:   Current Barriers:  Unable to independently monitor therapeutic efficacy Unaware of vaccine recommendations  Pharmacist Clinical Goal(s):  Patient will achieve adherence to monitoring guidelines and medication adherence to achieve therapeutic efficacy through collaboration with PharmD and provider.   Interventions: 1:1 collaboration with [name], MD regarding development and update of comprehensive plan of care as evidenced by provider attestation and co-signature Inter-disciplinary care team collaboration (see longitudinal plan of care) Comprehensive medication review performed; medication list updated in electronic medical record    Hypertension    BP goal is:  <130/80 Patient checks BP at home infrequently Patient home BP readings are ranging: n/a   Patient has failed these meds in the past: metoprolol Patient is currently controlled on the following medications: Amlodipine 10 mg daily - AM Furosemide 20 mg daily - AM   We discussed: BP has been at goal in clinic; he rarely checks at home; pt endorses compliance with amlodipine; he does not use furosemide much   Plan: Continue current medications and control with diet and exercise    Hyperlipidemia    LDL goal < 100   Patient has failed these meds in past: n/a Patient is currently controlled on the following medications: Rosuvastatin 10 mg daily - AM Aspirin 81 mg daily   We discussed:  pt endorses compliance and denies issues; medications were just refilled this month   Plan: Continue current medications and control with diet and exercise   Health Maintenance -Vaccine gaps: Shingrix, TDAP, covid booster dose #4 -Pt reports he got a 4th COVID vaccine in the last few months at [place] pt to get Shingrix and TDAP at local pharmacy   Patient Goals/Self-Care Activities Patient will:  - take medications as prescribed focus on medication adherence by routine check blood pressure as needed, document, and provide at future appointments -Get Shingrix and Tetatunus booster at local pharmacy      Medication Assistance: None required.  Patient affirms current coverage meets needs.  Compliance/Adherence/Medication fill history: Care Gaps: Shingrix  TDAP (due 01/24/20) Covid booster (due 06/21/20)  Star-Rating Drugs: Rosuvastatin - LF 09/07/20 x 90 ds  Patient's preferred pharmacy is:  [id number] - [place], [address] [place] [address] Phone: [phone] Fax: [phone]  Uses pill box? No - prefers bottles Pt endorses 100%  compliance  We discussed: Current pharmacy is preferred with insurance plan and patient is satisfied with pharmacy services Patient decided to: Continue current medication management strategy  Care Plan and Follow Up Patient Decision:  Patient agrees to Care Plan and Follow-up.  Plan: Telephone follow up appointment with care management team member scheduled for:  1 year  [name], PharmD, [place], CPP Clinical Pharmacist [facility] at [facility] [phone]

## 2021-01-13 NOTE — Patient Instructions (Signed)
Visit Information  Phone number for Pharmacist: [phone]   Goals Addressed             This Visit's Progress    Manage My Medicine       Timeframe:  Long-Range Goal Priority:  Medium Start Date:   01/13/21                          Expected End Date:       01/13/22                Follow Up Date Dec 2022   - call for medicine refill 2 or 3 days before it runs out - call if I am sick and can't take my medicine - keep a list of all the medicines I take; vitamins and herbals too  -Get Shingrix and Tetatunus booster at local pharmacy   Why is this important?   These steps will help you keep on track with your medicines.   Notes:         Patient verbalizes understanding of instructions provided today and agrees to view in [place].  Telephone follow up appointment with pharmacy team member scheduled for: 1 year  [name], PharmD, [place], CPP Clinical Pharmacist [facility] [phone]

## 2021-01-13 NOTE — Progress Notes (Signed)
    Chronic Care Management Pharmacy Assistant   Name: Cory Vasquez MRN: [id number] DOB: 25-Mar-1946  Called [facility] to get patients Covid vaccines dates, per [facility] patient had 1st vaccine on 03/21/20 and a Booster on 12/02/20. Called and spoke with patient and asked if he went anywhere else to receive vaccines, patient stated he got all his vaccines at the same [facility] and he will call me back with the dates when he gets his card out of his car.   Orinda Kenner, [place] Clinical Pharmacists Assistant [phone]  Time Spent: 41

## 2021-01-16 ENCOUNTER — Ambulatory Visit: Payer: Medicare Other | Admitting: Internal Medicine

## 2021-02-11 ENCOUNTER — Telehealth (INDEPENDENT_AMBULATORY_CARE_PROVIDER_SITE_OTHER): Payer: Medicare Other | Admitting: Internal Medicine

## 2021-02-11 ENCOUNTER — Other Ambulatory Visit: Payer: Medicare Other

## 2021-02-11 VITALS — Temp 98.7°F

## 2021-02-11 DIAGNOSIS — U071 COVID-19: Secondary | ICD-10-CM

## 2021-02-11 MED ORDER — MOLNUPIRAVIR EUA 200MG CAPSULE: 4.0000 | ORAL_CAPSULE | Freq: Two times a day (BID) | ORAL | 0 refills | Status: AC

## 2021-02-11 NOTE — Progress Notes (Signed)
Subjective:    Patient ID: Cory Vasquez, male    DOB: October 07, 1945, 75 y.o.   MRN: [id number]  DOS:  02/11/2021 Type of visit - description: Virtual Visit via Telephone    I connected with above mentioned patient  by telephone and verified that I am speaking with the correct person using two identifiers.  THIS ENCOUNTER IS A VIRTUAL VISIT DUE TO COVID-19 - PATIENT WAS NOT SEEN IN THE OFFICE. PATIENT HAS CONSENTED TO VIRTUAL VISIT / TELEMEDICINE VISIT   Location of patient: home  Location of provider: office  Persons participating in the virtual visit: patient, provider   I discussed the limitations, risks, security and privacy concerns of performing an evaluation and management service by telephone and the availability of in person appointments. I also discussed with the patient that there may be a patient responsible charge related to this service. The patient expressed understanding and agreed to proceed.  Acute The patient was feeling great until yesterday afternoon when he developed cough, sore throat and fatigue. Because of above, took a home COVID test and it came back positive. His wife remains negative.  Denies any fever chills No chest pain no difficulty breathing Mild myalgias. Has not checked his blood pressure but is typically okay.   Review of Systems See above   Past Medical History:  Diagnosis Date   Diverticulosis    see colonoscopy 5/06 rec fu 11/2014   HBP (high blood pressure)    ACE d/c'd 11/02/08. cough/sorethroat > resolved   Health maintenance examination    [name]. Td June 30/2011. CPX June 30/2011   Hyperlipidemia    Palpitations    neg myoview 11/05 with EF 58%   Prostate cancer ([place])    SVT (supraventricular tachycardia) [facility])     Past Surgical History:  Procedure Laterality Date   BACK SURGERY     [name] 11/08    Allergies as of 02/11/2021   No Known Allergies      Medication List        Accurate as of February 11, 2021  4:22 PM. If  you have any questions, ask your nurse or doctor.          amLODipine 10 MG tablet Commonly known as: Norvasc Take 1 tablet (10 mg total) by mouth daily.   aspirin 81 MG EC tablet Take 81 mg by mouth daily.   furosemide 20 MG tablet Commonly known as: Lasix Take 1 tablet (20 mg total) by mouth daily.   multivitamin with minerals Tabs tablet Take 1 tablet by mouth daily.   rosuvastatin 10 MG tablet Commonly known as: CRESTOR TAKE 1 TABLET(10 MG) BY MOUTH DAILY   sildenafil 20 MG tablet Commonly known as: REVATIO Take 20 mg by mouth as needed.           Objective:   Physical Exam Attempted attempted video visit, unable to communicate with the patient, consequently I called him over the phone, he sounded well, alert oriented x3, no distress, speaking in complete sentences.     Assessment     75 year old gentleman, PMH includes HTN, DJD, hyperglycemia, had 4 COVID vaccine shots, presents with:  COVID-19 infection: As described above, the patient is 10, very active, walks daily, I think is a good candidate for oral therapy, explained the medication will decrease his risk of getting sicker. We agreed on Molnupiravir, rest, fluids, Tylenol if needed, Robitussin-DM. If not gradually better or if he gets worse he needs to seek medical attention.  He verbalized understanding.   Advised to quarantine x 10 days    I discussed the assessment and treatment plan with the patient. The patient was provided an opportunity to ask questions and all were answered. The patient agreed with the plan and demonstrated an understanding of the instructions.   The patient was advised to call back or seek an in-person evaluation if the symptoms worsen or if the condition fails to improve as anticipated.  I provided 20 minutes of non-face-to-face time during this encounter.  Kathlene November, MD

## 2021-02-13 ENCOUNTER — Telehealth: Payer: Self-pay

## 2021-02-17 ENCOUNTER — Ambulatory Visit: Payer: Medicare Other | Admitting: Internal Medicine

## 2021-02-17 NOTE — Telephone Encounter (Signed)
Pt stated that he has been checking his Bp at home and it is lower and stable. He said if it elevates again that he would call to schedule an OV.

## 2021-02-21 ENCOUNTER — Telehealth (INDEPENDENT_AMBULATORY_CARE_PROVIDER_SITE_OTHER): Payer: Medicare Other | Admitting: Internal Medicine

## 2021-02-21 DIAGNOSIS — R739 Hyperglycemia, unspecified: Secondary | ICD-10-CM | POA: Diagnosis not present

## 2021-02-21 DIAGNOSIS — U071 COVID-19: Secondary | ICD-10-CM

## 2021-02-21 DIAGNOSIS — I10 Essential (primary) hypertension: Secondary | ICD-10-CM

## 2021-02-21 NOTE — Progress Notes (Signed)
Virtual Visit via Video Note  I connected with Cory Vasquez on 02/21/21 at 10:40 AM EDT by a video enabled telemedicine application and verified that I am speaking with the correct person using two identifiers.  Location of all participants today Patient: at home Provider: at office   I discussed the limitations of evaluation and management by telemedicine and the availability of in person appointments. The patient expressed understanding and agreed to proceed.  History of Present Illness: Here to f/u post covid when tested + 2 wks ago, then tx with molpunivir july 20 and tolerated well.  Since then BP has remained mild elevated in the 140-160's over baseline and only recently starting to improve now more often < 140/90;  has since then tested + for covid again even after the antibiotic tx and feeling better, but most recenlty neg, so thinks he can eventually go back to work.  Pt denies chest pain, increased sob or doe, wheezing, orthopnea, PND, increased LE swelling, palpitations, dizziness or syncope, though has some fatigue and occasional cough.  No n/v, abd pain, diarrhea.   Pt denies polydipsia, polyuria, Past Medical History:  Diagnosis Date   Diverticulosis    see colonoscopy 5/06 rec fu 11/2014   HBP (high blood pressure)    ACE d/c'd 11/02/08. cough/sorethroat > resolved   Health maintenance examination    [name]. Td June 30/2011. CPX June 30/2011   Hyperlipidemia    Palpitations    neg myoview 11/05 with EF 58%   Prostate cancer [facility])    SVT (supraventricular tachycardia) [facility])    Past Surgical History:  Procedure Laterality Date   BACK SURGERY     [name] 11/08    reports that he has never smoked. He has never used smokeless tobacco. He reports current alcohol use of about 5.0 - 6.0 standard drinks of alcohol per week. He reports that he does not use drugs. family history includes Diabetes in his brother; Heart disease (age of onset: 66) in his father. No Known  Allergies Current Outpatient Medications on File Prior to Visit  Medication Sig Dispense Refill   amLODipine (NORVASC) 10 MG tablet Take 1 tablet (10 mg total) by mouth daily. 90 tablet 1   aspirin 81 MG EC tablet Take 81 mg by mouth daily.     furosemide (LASIX) 20 MG tablet Take 1 tablet (20 mg total) by mouth daily. 90 tablet 1   Multiple Vitamin (MULTIVITAMIN WITH MINERALS) TABS tablet Take 1 tablet by mouth daily.     rosuvastatin (CRESTOR) 10 MG tablet TAKE 1 TABLET(10 MG) BY MOUTH DAILY 90 tablet 1   sildenafil (REVATIO) 20 MG tablet Take 20 mg by mouth as needed.     No current facility-administered medications on file prior to visit.    Observations/Objective: Alert, NAD, appropriate mood and affect, resps normal, cn 2-12 intact, moves all 4s, no visible rash or swelling Lab Results  Component Value Date   WBC 10.3 01/24/2020   HGB 14.7 01/24/2020   HCT 42.8 01/24/2020   PLT 261.0 01/24/2020   GLUCOSE 88 03/28/2020   CHOL 140 03/28/2020   TRIG 105 03/28/2020   HDL 62 03/28/2020   LDLCALC 59 03/28/2020   ALT 18 03/28/2020   AST 23 03/28/2020   NA 143 03/28/2020   K 5.0 03/28/2020   CL 104 03/28/2020   CREATININE 1.01 03/28/2020   BUN 11 03/28/2020   CO2 29 03/28/2020   TSH 1.12 03/29/2019   PSA 1.54 01/17/2020  INR 0.9 05/31/2007   HGBA1C 5.6 03/28/2020   Assessment and Plan: See notes  Follow Up Instructions: See notes   I discussed the assessment and treatment plan with the patient. The patient was provided an opportunity to ask questions and all were answered. The patient agreed with the plan and demonstrated an understanding of the instructions.   The patient was advised to call back or seek an in-person evaluation if the symptoms worsen or if the condition fails to improve as anticipated.   Cathlean Cower, MD

## 2021-02-22 ENCOUNTER — Encounter: Payer: Self-pay | Admitting: Internal Medicine

## 2021-02-22 DIAGNOSIS — U071 COVID-19: Secondary | ICD-10-CM | POA: Insufficient documentation

## 2021-02-22 NOTE — Assessment & Plan Note (Signed)
Recently elevated likely due to hyperdynamic due to illness, most recenlty improving again, ok to follow and BP likely to remain controlled now on current med, no need for change amlodipine, lasix

## 2021-02-22 NOTE — Patient Instructions (Signed)
Please continue all other medications as before, and refills have been done if requested.  Please have the pharmacy call with any other refills you may need.  Please continue your efforts at being more active, low cholesterol diet, and weight control.  Please keep your appointments with your specialists as you may have planned     

## 2021-02-22 NOTE — Assessment & Plan Note (Signed)
symptomatically much improved; ok for otc delsym prn cough and should be ok for return to work.

## 2021-02-22 NOTE — Assessment & Plan Note (Signed)
Lab Results  Component Value Date   HGBA1C 5.6 03/28/2020   Stable, pt to continue current medical treatment  - diet

## 2021-02-25 ENCOUNTER — Other Ambulatory Visit: Payer: Self-pay

## 2021-02-25 ENCOUNTER — Ambulatory Visit: Payer: Medicare Other

## 2021-02-25 DIAGNOSIS — I10 Essential (primary) hypertension: Secondary | ICD-10-CM

## 2021-02-25 NOTE — Progress Notes (Signed)
Pt states he recorded two Bps with his home monitor (goes around wrist) where systolic was [id number].  Pt denies h/a or chest pain.  He walked in to compare BP from machine.  Order received from PCP to check BP. BP 138/76, PCP notified. No new orders rec'd.  AWV & annual exam scheduled for Sept 2022; pt advised to continue current HTN course.  Pt verb understanding.

## 2021-02-27 DIAGNOSIS — L821 Other seborrheic keratosis: Secondary | ICD-10-CM | POA: Diagnosis not present

## 2021-02-27 DIAGNOSIS — Z85828 Personal history of other malignant neoplasm of skin: Secondary | ICD-10-CM | POA: Diagnosis not present

## 2021-02-27 DIAGNOSIS — L814 Other melanin hyperpigmentation: Secondary | ICD-10-CM | POA: Diagnosis not present

## 2021-02-27 DIAGNOSIS — L570 Actinic keratosis: Secondary | ICD-10-CM | POA: Diagnosis not present

## 2021-03-27 NOTE — Progress Notes (Signed)
Subjective:    Patient ID: Cory Vasquez, male    DOB: 06-23-1946, 75 y.o.   MRN: [id number]  HPI The patient is here for an acute visit for ear wax buildup   He states discomfort intermittently in the left ear.  He thinks he may have too much ear wax.  That happens every 3 years or so and he needs to come here to have it cleaned out. He denies change in hearing.    Medications and allergies reviewed with patient and updated if appropriate.  Patient Active Problem List   Diagnosis Date Noted   COVID-19 virus infection 02/22/2021   Need for hepatitis C screening test 03/28/2020   Degenerative disc disease, cervical 01/24/2020   Colon cancer screening 03/29/2019   Hyperglycemia 03/29/2019   Upper airway cough syndrome 03/25/2018   Routine general medical examination at a health care facility 03/27/2015   Chronic rhinitis 03/22/2014   Prostate nodule without urinary obstruction dx prostate ca/ limited 02/2014  03/06/2013   Dyslipidemia, goal LDL below 130 06/14/2007   Essential hypertension 06/14/2007   Diverticulosis of colon without hemorrhage 06/14/2007    Current Outpatient Medications on File Prior to Visit  Medication Sig Dispense Refill   amLODipine (NORVASC) 10 MG tablet Take 1 tablet (10 mg total) by mouth daily. 90 tablet 1   aspirin 81 MG EC tablet Take 81 mg by mouth daily.     furosemide (LASIX) 20 MG tablet Take 1 tablet (20 mg total) by mouth daily. 90 tablet 1   Multiple Vitamin (MULTIVITAMIN WITH MINERALS) TABS tablet Take 1 tablet by mouth daily.     rosuvastatin (CRESTOR) 10 MG tablet TAKE 1 TABLET(10 MG) BY MOUTH DAILY 90 tablet 1   sildenafil (REVATIO) 20 MG tablet Take 20 mg by mouth as needed.     No current facility-administered medications on file prior to visit.    Past Medical History:  Diagnosis Date   Diverticulosis    see colonoscopy 5/06 rec fu 11/2014   HBP (high blood pressure)    ACE d/c'd 11/02/08. cough/sorethroat > resolved   Health  maintenance examination    [name]. Td June 30/2011. CPX June 30/2011   Hyperlipidemia    Palpitations    neg myoview 11/05 with EF 58%   Prostate cancer (HCC)    SVT (supraventricular tachycardia) [facility])     Past Surgical History:  Procedure Laterality Date   BACK SURGERY     [name] 11/08    Social History   Socioeconomic History   Marital status: Married    Spouse name: Not on file   Number of children: 2   Years of education: Not on file   Highest education level: Not on file  Occupational History   Occupation: Salesman  Tobacco Use   Smoking status: Never   Smokeless tobacco: Never  Substance and Sexual Activity   Alcohol use: Yes    Alcohol/week: 5.0 - 6.0 standard drinks    Types: 5 - 6 Standard drinks or equivalent per week    Comment: Occasional   Drug use: No   Sexual activity: Not on file  Other Topics Concern   Not on file  Social History Narrative   Denies flu shot 09/29/2010         Social Determinants of Health   Financial Resource Strain: Not on file  Food Insecurity: Not on file  Transportation Needs: Not on file  Physical Activity: Not on file  Stress: Not on  file  Social Connections: Not on file    Family History  Problem Relation Age of Onset   Heart disease Father 25   Diabetes Brother     Review of Systems  Constitutional:  Negative for fever.  HENT:  Positive for ear pain (ear ache at times). Negative for hearing loss, sinus pressure, sore throat and tinnitus.   Neurological:  Negative for headaches.      Objective:   Vitals:   03/28/21 0820  BP: 140/84  Pulse: 71  Temp: 98.1 F (36.7 C)  SpO2: 98%   BP Readings from Last 3 Encounters:  03/28/21 140/84  02/25/21 138/76  12/25/20 130/72   Wt Readings from Last 3 Encounters:  03/28/21 189 lb (85.7 kg)  12/25/20 189 lb (85.7 kg)  03/28/20 192 lb (87.1 kg)   Body mass index is 25.63 kg/m.   Physical Exam    PRE-PROCEDURE EXAM: Right TM and Left TM cannot be  visualized due to excessive ceruemn of the ear canal. PROCEDURE INDICATION: remove wax to visualize ear drum & relieve discomfort CONSENT:  Verbal  PROCEDURE NOTE:   RIGHT EAR:  The CMA used a metal wax curette under direct vision with an otoscope to free the wax bolus from the ear wall and then successfully removed a small bit of wax. The ear was then irrigated with warm water to remove the remaining wax.  LEFT EAR:  The CMA used a metal wax curette under direct vision with an otoscope to free the wax bolus from the ear wall and then successfully removed a small bit of wax. The ear was then irrigated with warm water to remove the remaining wax.  POST- PROCEDURE EXAM: TMs successfully visualized and found to have no erythema.   Left ear canal with mild erythema from lavage - he denies pain.  He tolerated the procedure well.      Assessment & Plan:    Left ear discomfort, excessive cerumen in b/l ears: Acute Intermittent discomfort in left ear with excessive cerumen in both ears Successful ear lavage of both ears - tolerated the procedure well Ear exam post lavage reveals no concerning findings   This visit occurred during the SARS-CoV-2 public health emergency.  Safety protocols were in place, including screening questions prior to the visit, additional usage of staff PPE, and extensive cleaning of exam room while observing appropriate contact time as indicated for disinfecting solutions.

## 2021-03-28 ENCOUNTER — Other Ambulatory Visit: Payer: Self-pay

## 2021-03-28 ENCOUNTER — Encounter: Payer: Self-pay | Admitting: Internal Medicine

## 2021-03-28 ENCOUNTER — Ambulatory Visit (INDEPENDENT_AMBULATORY_CARE_PROVIDER_SITE_OTHER): Payer: Medicare Other | Admitting: Internal Medicine

## 2021-03-28 VITALS — BP 140/84 | HR 71 | Temp 98.1°F | Ht 72.0 in | Wt 189.0 lb

## 2021-03-28 DIAGNOSIS — H6123 Impacted cerumen, bilateral: Secondary | ICD-10-CM | POA: Diagnosis not present

## 2021-03-28 DIAGNOSIS — H9202 Otalgia, left ear: Secondary | ICD-10-CM

## 2021-03-28 NOTE — Patient Instructions (Addendum)
  Your ears were successfully cleaned out today.

## 2021-04-10 ENCOUNTER — Other Ambulatory Visit: Payer: Self-pay

## 2021-04-10 ENCOUNTER — Encounter: Payer: Self-pay | Admitting: Internal Medicine

## 2021-04-10 ENCOUNTER — Ambulatory Visit (INDEPENDENT_AMBULATORY_CARE_PROVIDER_SITE_OTHER): Payer: Medicare Other | Admitting: Internal Medicine

## 2021-04-10 ENCOUNTER — Ambulatory Visit (INDEPENDENT_AMBULATORY_CARE_PROVIDER_SITE_OTHER): Payer: Medicare Other

## 2021-04-10 VITALS — BP 122/72 | HR 68 | Temp 98.3°F | Ht 72.0 in | Wt 188.0 lb

## 2021-04-10 VITALS — BP 122/72 | HR 68 | Temp 98.3°F | Ht 72.0 in | Wt 188.8 lb

## 2021-04-10 DIAGNOSIS — N402 Nodular prostate without lower urinary tract symptoms: Secondary | ICD-10-CM | POA: Diagnosis not present

## 2021-04-10 DIAGNOSIS — I10 Essential (primary) hypertension: Secondary | ICD-10-CM | POA: Diagnosis not present

## 2021-04-10 DIAGNOSIS — Z23 Encounter for immunization: Secondary | ICD-10-CM | POA: Diagnosis not present

## 2021-04-10 DIAGNOSIS — Z0000 Encounter for general adult medical examination without abnormal findings: Secondary | ICD-10-CM

## 2021-04-10 DIAGNOSIS — E785 Hyperlipidemia, unspecified: Secondary | ICD-10-CM

## 2021-04-10 LAB — BASIC METABOLIC PANEL
BUN: 15 mg/dL (ref 6–23)
CO2: 30 mEq/L (ref 19–32)
Calcium: 9.5 mg/dL (ref 8.4–10.5)
Chloride: 104 mEq/L (ref 96–112)
Creatinine, Ser: 1.02 mg/dL (ref 0.40–1.50)
GFR: 72.62 mL/min (ref >60.00)
Glucose, Bld: 90 mg/dL (ref 70–99)
Potassium: 4.6 mEq/L (ref 3.5–5.1)
Sodium: 140 mEq/L (ref 135–145)

## 2021-04-10 LAB — CBC WITH DIFFERENTIAL/PLATELET
Basophils Absolute: 0 10*3/uL (ref 0.0–0.1)
Basophils Relative: 0.4 % (ref 0.0–3.0)
Eosinophils Absolute: 0.2 10*3/uL (ref 0.0–0.7)
Eosinophils Relative: 3 % (ref 0.0–5.0)
HCT: 45.3 % (ref 39.0–52.0)
Hemoglobin: 15.1 g/dL (ref 13.0–17.0)
Lymphocytes Relative: 35.4 % (ref 12.0–46.0)
Lymphs Abs: 2 10*3/uL (ref 0.7–4.0)
MCHC: 33.3 g/dL (ref 30.0–36.0)
MCV: 95.5 fl (ref 78.0–100.0)
Monocytes Absolute: 0.6 10*3/uL (ref 0.1–1.0)
Monocytes Relative: 10.6 % (ref 3.0–12.0)
Neutro Abs: 2.9 10*3/uL (ref 1.4–7.7)
Neutrophils Relative %: 50.6 % (ref 43.0–77.0)
Platelets: 260 10*3/uL (ref 150.0–400.0)
RBC: 4.75 Mil/uL (ref 4.22–5.81)
RDW: 13.1 % (ref 11.5–15.5)
WBC: 5.7 10*3/uL (ref 4.0–10.5)

## 2021-04-10 LAB — HEPATIC FUNCTION PANEL
ALT: 18 U/L (ref 0–53)
AST: 23 U/L (ref 0–37)
Albumin: 4.3 g/dL (ref 3.5–5.2)
Alkaline Phosphatase: 90 U/L (ref 39–117)
Bilirubin, Direct: 0.1 mg/dL (ref 0.0–0.3)
Total Bilirubin: 0.6 mg/dL (ref 0.2–1.2)
Total Protein: 7.1 g/dL (ref 6.0–8.3)

## 2021-04-10 LAB — LIPID PANEL
Cholesterol: 131 mg/dL (ref 0–200)
HDL: 62.5 mg/dL (ref >39.00)
LDL Cholesterol: 50 mg/dL (ref 0–99)
NonHDL: 68.89
Total CHOL/HDL Ratio: 2
Triglycerides: 94 mg/dL (ref 0.0–149.0)
VLDL: 18.8 mg/dL (ref 0.0–40.0)

## 2021-04-10 LAB — PSA: PSA: 1 ng/mL (ref 0.10–4.00)

## 2021-04-10 LAB — TSH: TSH: 1.17 u[IU]/mL (ref 0.35–5.50)

## 2021-04-10 MED ORDER — SHINGRIX 50 MCG/0.5ML IM SUSR: 0.5000 mL | Freq: Once | INTRAMUSCULAR | 1 refills | Status: AC

## 2021-04-10 MED ORDER — BOOSTRIX 5-2.5-18.5 LF-MCG/0.5 IM SUSP: 0.5000 mL | Freq: Once | INTRAMUSCULAR | 0 refills | Status: AC

## 2021-04-10 NOTE — Progress Notes (Signed)
Subjective:   Cory Vasquez is a 75 y.o. male who presents for Medicare Annual/Subsequent preventive examination.  Review of Systems     Cardiac Risk Factors include: advanced age (>14mn, >>14women);dyslipidemia;hypertension;male gender;family history of premature cardiovascular disease     Objective:    Today's Vitals   04/10/21 1003  BP: 122/72  Pulse: 68  Temp: 98.3 F (36.8 C)  SpO2: 97%  Weight: 188 lb 12.8 oz (85.6 kg)  Height: 6' (1.829 m)  PainSc: 0-No pain   Body mass index is 25.61 kg/m.  Advanced Directives 04/10/2021 11/26/2015 09/02/2015 04/25/2015  Does Patient Have a Medical Advance Directive? Yes No No No  Type of Advance Directive Living will;Healthcare Power of Attorney - - -  Does patient want to make changes to medical advance directive? No - Patient declined - - -  Copy of HWoody Creekin Chart? No - copy requested - - -    Current Medications (verified) Outpatient Encounter Medications as of 04/10/2021  Medication Sig   amLODipine (NORVASC) 10 MG tablet Take 1 tablet (10 mg total) by mouth daily.   aspirin 81 MG EC tablet Take 81 mg by mouth daily.   furosemide (LASIX) 20 MG tablet Take 1 tablet (20 mg total) by mouth daily.   Multiple Vitamin (MULTIVITAMIN WITH MINERALS) TABS tablet Take 1 tablet by mouth daily.   rosuvastatin (CRESTOR) 10 MG tablet TAKE 1 TABLET(10 MG) BY MOUTH DAILY   sildenafil (REVATIO) 20 MG tablet Take 20 mg by mouth as needed.   No facility-administered encounter medications on file as of 04/10/2021.    Allergies (verified) Patient has no known allergies.   History: Past Medical History:  Diagnosis Date   Diverticulosis    see colonoscopy 5/06 rec fu 11/2014   HBP (high blood pressure)    ACE d/c'd 11/02/08. cough/sorethroat > resolved   Health maintenance examination    [name]. Td June 30/2011. CPX June 30/2011   Hyperlipidemia    Palpitations    neg myoview 11/05 with EF 58%   Prostate cancer (HCC)     SVT (supraventricular tachycardia) (HCC)    Past Surgical History:  Procedure Laterality Date   BACK SURGERY     [name] 11/08   Family History  Problem Relation Age of Onset   Heart disease Father 764  Diabetes Brother    Social History   Socioeconomic History   Marital status: Married    Spouse name: Not on file   Number of children: 2   Years of education: Not on file   Highest education level: Not on file  Occupational History   Occupation: Salesman  Tobacco Use   Smoking status: Never   Smokeless tobacco: Never  Substance and Sexual Activity   Alcohol use: Yes    Alcohol/week: 5.0 - 6.0 standard drinks    Types: 5 - 6 Standard drinks or equivalent per week    Comment: Occasional   Drug use: No   Sexual activity: Not on file  Other Topics Concern   Not on file  Social History Narrative   Denies flu shot 09/29/2010         Social Determinants of Health   Financial Resource Strain: Low Risk    Difficulty of Paying Living Expenses: Not hard at all  Food Insecurity: No Food Insecurity   Worried About RCharity fundraiserin the Last Year: Never true   Ran Out of Food in the Last Year: Never  true  Transportation Needs: No Transportation Needs   Lack of Transportation (Medical): No   Lack of Transportation (Non-Medical): No  Physical Activity: Sufficiently Active   Days of Exercise per Week: 5 days   Minutes of Exercise per Session: 30 min  Stress: No Stress Concern Present   Feeling of Stress : Not at all  Social Connections: Not on file    Tobacco Counseling Counseling given: Not Answered   Clinical Intake:  Pre-visit preparation completed: Yes  Pain : No/denies pain Pain Score: 0-No pain     BMI - recorded: 25.5 Nutritional Status: BMI 25 -29 Overweight Nutritional Risks: None Diabetes: No  How often do you need to have someone help you when you read instructions, pamphlets, or other written materials from your doctor or pharmacy?: 1 -  Never What is the last grade level you completed in school?: Master's Degree  Diabetic? no  Interpreter Needed?: No  Information entered by :: Lisette Abu, LPN   Activities of Daily Living In your present state of health, do you have any difficulty performing the following activities: 04/10/2021 12/25/2020  Hearing? N N  Vision? N N  Difficulty concentrating or making decisions? N N  Walking or climbing stairs? N N  Dressing or bathing? N N  Doing errands, shopping? N N  Preparing Food and eating ? N -  Using the Toilet? N -  In the past six months, have you accidently leaked urine? N -  Do you have problems with loss of bowel control? N -  Managing your Medications? N -  Managing your Finances? N -  Housekeeping or managing your Housekeeping? N -  Some recent data might be hidden    Patient Care Team: [name], MD as PCP - General (Internal Medicine) Charlton Haws, [facility] as Pharmacist (Pharmacist) Center, [place] as Consulting Physician (Optometry)  Indicate any recent Medical Services you may have received from other than [facility] providers in the past year (date may be approximate).     Assessment:   This is a routine wellness examination for Cory Vasquez.  Hearing/Vision screen Hearing Screening - Comments:: Patient denied any hearing difficulty. Vision Screening - Comments:: Patient wears corrective lenses.  Annually eye exam done by [facility].  Dietary issues and exercise activities discussed: Current Exercise Habits: Home exercise routine, Type of exercise: walking, Time (Minutes): 30, Frequency (Times/Week): 5, Weekly Exercise (Minutes/Week): 150, Intensity: Moderate, Exercise limited by: None identified   Goals Addressed   None   Depression Screen PHQ 2/9 Scores 04/10/2021 03/28/2020 03/28/2019  PHQ - 2 Score 0 0 0    Fall Risk Fall Risk  04/10/2021 03/28/2020 03/28/2019  Falls in the past year? 0 0 0  Number falls in past yr: 0 - 0  Injury  with Fall? 0 - 0  Risk for fall due to : No Fall Risks - -  Follow up Falls evaluation completed - Falls evaluation completed    [place]:  Any stairs in or around the home? Yes  If so, are there any without handrails? No  Home free of loose throw rugs in walkways, pet beds, electrical cords, etc? Yes  Adequate lighting in your home to reduce risk of falls? Yes   ASSISTIVE DEVICES UTILIZED TO PREVENT FALLS:  Life alert? Yes  (Apple Watch) Use of a cane, walker or w/c? No  Grab bars in the bathroom? Yes  Shower chair or bench in shower? No  Elevated toilet seat or a handicapped toilet? No   TIMED UP AND GO:  Was the test performed? Yes .  Length of time to ambulate 10 feet: 5 sec.   Gait steady and fast without use of assistive device  Cognitive Function: Normal cognitive status assessed by direct observation by this Nurse Health Advisor. No abnormalities found.          Immunizations Immunization History  Administered Date(s) Administered   Fluad Quad(high Dose 65+) 03/28/2019   Influenza, High Dose Seasonal PF 03/25/2018   Influenza,inj,Quad PF,6+ Mos 03/28/2020   Moderna Sars-Covid-2 Vaccination 08/19/2019, 09/23/2019, 03/21/2020, 12/02/2020   Pneumococcal Conjugate-13 03/25/2016   Pneumococcal Polysaccharide-23 03/28/2019   Td 01/23/2010    TDAP status: Due, Education has been provided regarding the importance of this vaccine. Advised may receive this vaccine at local pharmacy or Health Dept. Aware to provide a copy of the vaccination record if obtained from local pharmacy or Health Dept. Verbalized acceptance and understanding.  Flu Vaccine status: Up to date  Pneumococcal vaccine status: Up to date  Covid-19 vaccine status: Completed vaccines  Qualifies for Shingles Vaccine? Yes   Zostavax completed No   Shingrix Completed?: No.    Education has been provided regarding the importance of this vaccine. Patient has been  advised to call insurance company to determine out of pocket expense if they have not yet received this vaccine. Advised may also receive vaccine at local pharmacy or Health Dept. Verbalized acceptance and understanding.  Screening Tests Health Maintenance  Topic Date Due   Zoster Vaccines- Shingrix (1 of 2) Never done   TETANUS/TDAP  01/24/2020   COVID-19 Vaccine (5 - Booster for Moderna series) 04/04/2021   INFLUENZA VACCINE  10/24/2021 (Originally 02/24/2021)   Fecal DNA (Cologuard)  04/10/2022   Hepatitis C Screening  Completed   PNA vac Low Risk Adult  Completed   HPV VACCINES  Aged Out    Health Maintenance  Health Maintenance Due  Topic Date Due   Zoster Vaccines- Shingrix (1 of 2) Never done   TETANUS/TDAP  01/24/2020   COVID-19 Vaccine (5 - Booster for Moderna series) 04/04/2021    Colorectal cancer screening: Type of screening: Cologuard. Completed 04/20/2019. Repeat every 3 years  Lung Cancer Screening: (Low Dose CT Chest recommended if Age 28-80 years, 30 pack-year currently smoking OR have quit w/in 15years.) does not qualify.   Lung Cancer Screening Referral: no  Additional Screening:  Hepatitis C Screening: does qualify; Completed yes  Vision Screening: Recommended annual ophthalmology exams for early detection of glaucoma and other disorders of the eye. Is the patient up to date with their annual eye exam?  Yes  Who is the provider or what is the name of the office in which the patient attends annual eye exams? [facility] If pt is not established with a provider, would they like to be referred to a provider to establish care? No .   Dental Screening: Recommended annual dental exams for proper oral hygiene  Community Resource Referral / Chronic Care Management: CRR required this visit?  No   CCM required this visit?  No      Plan:     I have personally reviewed and noted the following in the patient's chart:   Medical and social history Use  of alcohol, tobacco or illicit drugs  Current medications and supplements including opioid prescriptions. Patient is not currently taking opioid prescriptions. Functional ability and status Nutritional status Physical activity Advanced directives List of  other physicians Hospitalizations, surgeries, and ER visits in previous 12 months Vitals Screenings to include cognitive, depression, and falls Referrals and appointments  In addition, I have reviewed and discussed with patient certain preventive protocols, quality metrics, and best practice recommendations. A written personalized care plan for preventive services as well as general preventive health recommendations were provided to patient.     Sheral Flow, LPN   [id number]   Nurse Notes:  Hearing Screening - Comments:: Patient denied any hearing difficulty. Vision Screening - Comments:: Patient wears corrective lenses.  Annually eye exam done by [facility]

## 2021-04-10 NOTE — Patient Instructions (Signed)
Health Maintenance, Male Adopting a healthy lifestyle and getting preventive care are important in promoting health and wellness. Ask your health care provider about: The right schedule for you to have regular tests and exams. Things you can do on your own to prevent diseases and keep yourself healthy. What should I know about diet, weight, and exercise? Eat a healthy diet  Eat a diet that includes plenty of vegetables, fruits, low-fat dairy products, and lean protein. Do not eat a lot of foods that are high in solid fats, added sugars, or sodium. Maintain a healthy weight Body mass index (BMI) is a measurement that can be used to identify possible weight problems. It estimates body fat based on height and weight. Your health care provider can help determine your BMI and help you achieve or maintain a healthy weight. Get regular exercise Get regular exercise. This is one of the most important things you can do for your health. Most adults should: Exercise for at least 150 minutes each week. The exercise should increase your heart rate and make you sweat (moderate-intensity exercise). Do strengthening exercises at least twice a week. This is in addition to the moderate-intensity exercise. Spend less time sitting. Even light physical activity can be beneficial. Watch cholesterol and blood lipids Have your blood tested for lipids and cholesterol at 74 years of age, then have this test every 5 years. You may need to have your cholesterol levels checked more often if: Your lipid or cholesterol levels are high. You are older than 74 years of age. You are at high risk for heart disease. What should I know about cancer screening? Many types of cancers can be detected early and may often be prevented. Depending on your health history and family history, you may need to have cancer screening at various ages. This may include screening for: Colorectal cancer. Prostate cancer. Skin cancer. Lung  cancer. What should I know about heart disease, diabetes, and high blood pressure? Blood pressure and heart disease High blood pressure causes heart disease and increases the risk of stroke. This is more likely to develop in people who have high blood pressure readings, are of African descent, or are overweight. Talk with your health care provider about your target blood pressure readings. Have your blood pressure checked: Every 3-5 years if you are 18-39 years of age. Every year if you are 40 years old or older. If you are between the ages of 65 and 75 and are a current or former smoker, ask your health care provider if you should have a one-time screening for abdominal aortic aneurysm (AAA). Diabetes Have regular diabetes screenings. This checks your fasting blood sugar level. Have the screening done: Once every three years after age 45 if you are at a normal weight and have a low risk for diabetes. More often and at a younger age if you are overweight or have a high risk for diabetes. What should I know about preventing infection? Hepatitis B If you have a higher risk for hepatitis B, you should be screened for this virus. Talk with your health care provider to find out if you are at risk for hepatitis B infection. Hepatitis C Blood testing is recommended for: Everyone born from 1945 through 1965. Anyone with known risk factors for hepatitis C. Sexually transmitted infections (STIs) You should be screened each year for STIs, including gonorrhea and chlamydia, if: You are sexually active and are younger than 74 years of age. You are older than 74 years   of age and your health care provider tells you that you are at risk for this type of infection. Your sexual activity has changed since you were last screened, and you are at increased risk for chlamydia or gonorrhea. Ask your health care provider if you are at risk. Ask your health care provider about whether you are at high risk for HIV.  Your health care provider may recommend a prescription medicine to help prevent HIV infection. If you choose to take medicine to prevent HIV, you should first get tested for HIV. You should then be tested every 3 months for as long as you are taking the medicine. Follow these instructions at home: Lifestyle Do not use any products that contain nicotine or tobacco, such as cigarettes, e-cigarettes, and chewing tobacco. If you need help quitting, ask your health care provider. Do not use street drugs. Do not share needles. Ask your health care provider for help if you need support or information about quitting drugs. Alcohol use Do not drink alcohol if your health care provider tells you not to drink. If you drink alcohol: Limit how much you have to 0-2 drinks a day. Be aware of how much alcohol is in your drink. In the [place] one drink equals one 12 oz bottle of beer (355 mL), one 5 oz glass of wine (148 mL), or one 1 oz glass of hard liquor (44 mL). General instructions Schedule regular health, dental, and eye exams. Stay current with your vaccines. Tell your health care provider if: You often feel depressed. You have ever been abused or do not feel safe at home. Summary Adopting a healthy lifestyle and getting preventive care are important in promoting health and wellness. Follow your health care provider's instructions about healthy diet, exercising, and getting tested or screened for diseases. Follow your health care provider's instructions on monitoring your cholesterol and blood pressure. This information is not intended to replace advice given to you by your health care provider. Make sure you discuss any questions you have with your health care provider. Document Revised: 09/20/2020 Document Reviewed: 07/06/2018 Elsevier Patient Education  2022 [employer]  

## 2021-04-10 NOTE — Patient Instructions (Signed)
Mr. Cory Vasquez , Thank you for taking time to come for your Medicare Wellness Visit. I appreciate your ongoing commitment to your health goals. Please review the following plan we discussed and let me know if I can assist you in the future.   Screening recommendations/referrals: Colonoscopy: 04/20/2019; Cologuard due every 3 years Recommended yearly ophthalmology/optometry visit for glaucoma screening and checkup Recommended yearly dental visit for hygiene and checkup  Vaccinations: Influenza vaccine: 03/28/2020 Pneumococcal vaccine: 03/25/2016, 03/28/2019 Tdap vaccine: 01/23/2010; due every 10 years (overdue) Shingles vaccine: never done   Covid-19: 08/19/2019, 09/23/2019, 03/21/2020, 12/02/2020  Advanced directives: Please bring a copy of your health care power of attorney and living will to the office at your convenience.  Conditions/risks identified: Yes; Client understands the importance of follow-up with providers by attending scheduled visits and discussed goals to eat healthier, increase physical activity, exercise the brain, socialize more, get enough sleep and make time for laughter.  Next appointment: Please schedule your next Medicare Wellness Visit with your Nurse Health Advisor in 1 year by calling [phone].  Preventive Care 74 Years and Older, Male Preventive care refers to lifestyle choices and visits with your health care provider that can promote health and wellness. What does preventive care include? A yearly physical exam. This is also called an annual well check. Dental exams once or twice a year. Routine eye exams. Ask your health care provider how often you should have your eyes checked. Personal lifestyle choices, including: Daily care of your teeth and gums. Regular physical activity. Eating a healthy diet. Avoiding tobacco and drug use. Limiting alcohol use. Practicing safe sex. Taking low doses of aspirin every day. Taking vitamin and mineral supplements as  recommended by your health care provider. What happens during an annual well check? The services and screenings done by your health care provider during your annual well check will depend on your age, overall health, lifestyle risk factors, and family history of disease. Counseling  Your health care provider may ask you questions about your: Alcohol use. Tobacco use. Drug use. Emotional well-being. Home and relationship well-being. Sexual activity. Eating habits. History of falls. Memory and ability to understand (cognition). Work and work Statistician. Screening  You may have the following tests or measurements: Height, weight, and BMI. Blood pressure. Lipid and cholesterol levels. These may be checked every 5 years, or more frequently if you are over 39 years old. Skin check. Lung cancer screening. You may have this screening every year starting at age 74 if you have a 30-pack-year history of smoking and currently smoke or have quit within the past 15 years. Fecal occult blood test (FOBT) of the stool. You may have this test every year starting at age 74. Flexible sigmoidoscopy or colonoscopy. You may have a sigmoidoscopy every 5 years or a colonoscopy every 10 years starting at age 74. Prostate cancer screening. Recommendations will vary depending on your family history and other risks. Hepatitis C blood test. Hepatitis B blood test. Sexually transmitted disease (STD) testing. Diabetes screening. This is done by checking your blood sugar (glucose) after you have not eaten for a while (fasting). You may have this done every 1-3 years. Abdominal aortic aneurysm (AAA) screening. You may need this if you are a current or former smoker. Osteoporosis. You may be screened starting at age 74 if you are at high risk. Talk with your health care provider about your test results, treatment options, and if necessary, the need for more tests. Vaccines  Your health care  provider may recommend  certain vaccines, such as: Influenza vaccine. This is recommended every year. Tetanus, diphtheria, and acellular pertussis (Tdap, Td) vaccine. You may need a Td booster every 10 years. Zoster vaccine. You may need this after age 48. Pneumococcal 13-valent conjugate (PCV13) vaccine. One dose is recommended after age 74. Pneumococcal polysaccharide (PPSV23) vaccine. One dose is recommended after age 74. Talk to your health care provider about which screenings and vaccines you need and how often you need them. This information is not intended to replace advice given to you by your health care provider. Make sure you discuss any questions you have with your health care provider. Document Released: 08/09/2015 Document Revised: 04/01/2016 Document Reviewed: 05/14/2015 Elsevier Interactive Patient Education  2017 [employer] Prevention in the Home Falls can cause injuries. They can happen to people of all ages. There are many things you can do to make your home safe and to help prevent falls. What can I do on the outside of my home? Regularly fix the edges of walkways and driveways and fix any cracks. Remove anything that might make you trip as you walk through a door, such as a raised step or threshold. Trim any bushes or trees on the path to your home. Use bright outdoor lighting. Clear any walking paths of anything that might make someone trip, such as rocks or tools. Regularly check to see if handrails are loose or broken. Make sure that both sides of any steps have handrails. Any raised decks and porches should have guardrails on the edges. Have any leaves, snow, or ice cleared regularly. Use sand or salt on walking paths during winter. Clean up any spills in your garage right away. This includes oil or grease spills. What can I do in the bathroom? Use night lights. Install grab bars by the toilet and in the tub and shower. Do not use towel bars as grab bars. Use non-skid mats or  decals in the tub or shower. If you need to sit down in the shower, use a plastic, non-slip stool. Keep the floor dry. Clean up any water that spills on the floor as soon as it happens. Remove soap buildup in the tub or shower regularly. Attach bath mats securely with double-sided non-slip rug tape. Do not have throw rugs and other things on the floor that can make you trip. What can I do in the bedroom? Use night lights. Make sure that you have a light by your bed that is easy to reach. Do not use any sheets or blankets that are too big for your bed. They should not hang down onto the floor. Have a firm chair that has side arms. You can use this for support while you get dressed. Do not have throw rugs and other things on the floor that can make you trip. What can I do in the kitchen? Clean up any spills right away. Avoid walking on wet floors. Keep items that you use a lot in easy-to-reach places. If you need to reach something above you, use a strong step stool that has a grab bar. Keep electrical cords out of the way. Do not use floor polish or wax that makes floors slippery. If you must use wax, use non-skid floor wax. Do not have throw rugs and other things on the floor that can make you trip. What can I do with my stairs? Do not leave any items on the stairs. Make sure that there are handrails on both  sides of the stairs and use them. Fix handrails that are broken or loose. Make sure that handrails are as long as the stairways. Check any carpeting to make sure that it is firmly attached to the stairs. Fix any carpet that is loose or worn. Avoid having throw rugs at the top or bottom of the stairs. If you do have throw rugs, attach them to the floor with carpet tape. Make sure that you have a light switch at the top of the stairs and the bottom of the stairs. If you do not have them, ask someone to add them for you. What else can I do to help prevent falls? Wear shoes that: Do not  have high heels. Have rubber bottoms. Are comfortable and fit you well. Are closed at the toe. Do not wear sandals. If you use a stepladder: Make sure that it is fully opened. Do not climb a closed stepladder. Make sure that both sides of the stepladder are locked into place. Ask someone to hold it for you, if possible. Clearly mark and make sure that you can see: Any grab bars or handrails. First and last steps. Where the edge of each step is. Use tools that help you move around (mobility aids) if they are needed. These include: Canes. Walkers. Scooters. Crutches. Turn on the lights when you go into a dark area. Replace any light bulbs as soon as they burn out. Set up your furniture so you have a clear path. Avoid moving your furniture around. If any of your floors are uneven, fix them. If there are any pets around you, be aware of where they are. Review your medicines with your doctor. Some medicines can make you feel dizzy. This can increase your chance of falling. Ask your doctor what other things that you can do to help prevent falls. This information is not intended to replace advice given to you by your health care provider. Make sure you discuss any questions you have with your health care provider. Document Released: 05/09/2009 Document Revised: 12/19/2015 Document Reviewed: 08/17/2014 Elsevier Interactive Patient Education  2017 [employer].

## 2021-04-10 NOTE — Progress Notes (Signed)
Subjective:  Patient ID: Cory Vasquez, male    DOB: 11-Aug-1945  Age: 75 y.o. MRN: [id number]  CC: Annual Exam, Hypertension, and Hyperlipidemia  This visit occurred during the SARS-CoV-2 public health emergency.  Safety protocols were in place, including screening questions prior to the visit, additional usage of staff PPE, and extensive cleaning of exam room while observing appropriate contact time as indicated for disinfecting solutions.    HPI SARITH Vasquez presents for a CPX and f/up -  He is active and denies any recent episodes of chest pain, shortness of breath, diaphoresis, palpitations, dizziness, or lightheadedness.  He would like to do a test to see if he is at risk of having a heart attack.  Outpatient Medications Prior to Visit  Medication Sig Dispense Refill   amLODipine (NORVASC) 10 MG tablet Take 1 tablet (10 mg total) by mouth daily. 90 tablet 1   aspirin 81 MG EC tablet Take 81 mg by mouth daily.     furosemide (LASIX) 20 MG tablet Take 1 tablet (20 mg total) by mouth daily. 90 tablet 1   Multiple Vitamin (MULTIVITAMIN WITH MINERALS) TABS tablet Take 1 tablet by mouth daily.     rosuvastatin (CRESTOR) 10 MG tablet TAKE 1 TABLET(10 MG) BY MOUTH DAILY 90 tablet 1   sildenafil (REVATIO) 20 MG tablet Take 20 mg by mouth as needed.     No facility-administered medications prior to visit.    ROS Review of Systems  Constitutional:  Negative for appetite change, diaphoresis, fatigue and unexpected weight change.  HENT: Negative.    Eyes: Negative.   Respiratory:  Negative for cough, chest tightness, shortness of breath and wheezing.   Cardiovascular:  Negative for chest pain, palpitations and leg swelling.  Gastrointestinal:  Negative for abdominal pain, constipation, diarrhea, nausea and vomiting.  Endocrine: Negative.   Genitourinary: Negative.  Negative for difficulty urinating, flank pain, hematuria, scrotal swelling, testicular pain and urgency.   Musculoskeletal:  Negative for arthralgias, joint swelling and myalgias.  Skin: Negative.  Negative for color change.  Neurological: Negative.  Negative for dizziness and weakness.  Hematological:  Negative for adenopathy. Does not bruise/bleed easily.  Psychiatric/Behavioral: Negative.     Objective:  BP 122/72   Pulse 68   Temp 98.3 F (36.8 C) (Oral)   Ht 6' (1.829 m)   Wt 188 lb (85.3 kg)   SpO2 97%   BMI 25.50 kg/m   BP Readings from Last 3 Encounters:  04/10/21 122/72  04/10/21 122/72  03/28/21 140/84    Wt Readings from Last 3 Encounters:  04/10/21 188 lb (85.3 kg)  04/10/21 188 lb 12.8 oz (85.6 kg)  03/28/21 189 lb (85.7 kg)    Physical Exam Vitals reviewed.  Constitutional:      Appearance: Normal appearance.  HENT:     Nose: Nose normal.     Mouth/Throat:     Mouth: Mucous membranes are moist.  Eyes:     General: No scleral icterus.    Conjunctiva/sclera: Conjunctivae normal.  Cardiovascular:     Rate and Rhythm: Normal rate and regular rhythm.     Heart sounds: No murmur heard. Pulmonary:     Effort: Pulmonary effort is normal.     Breath sounds: No stridor. No wheezing, rhonchi or rales.  Abdominal:     General: Abdomen is flat.     Palpations: There is no mass.     Tenderness: There is no abdominal tenderness. There is no guarding.  Hernia: No hernia is present.  Musculoskeletal:        General: Normal range of motion.     Cervical back: Neck supple.     Right lower leg: No edema.  Lymphadenopathy:     Cervical: No cervical adenopathy.  Skin:    General: Skin is warm.  Neurological:     General: No focal deficit present.     Mental Status: He is alert.  Psychiatric:        Mood and Affect: Mood normal.        Behavior: Behavior normal.    Lab Results  Component Value Date   WBC 5.7 04/10/2021   HGB 15.1 04/10/2021   HCT 45.3 04/10/2021   PLT 260.0 04/10/2021   GLUCOSE 90 04/10/2021   CHOL 131 04/10/2021   TRIG 94.0  04/10/2021   HDL 62.50 04/10/2021   LDLCALC 50 04/10/2021   ALT 18 04/10/2021   AST 23 04/10/2021   NA 140 04/10/2021   K 4.6 04/10/2021   CL 104 04/10/2021   CREATININE 1.02 04/10/2021   BUN 15 04/10/2021   CO2 30 04/10/2021   TSH 1.17 04/10/2021   PSA 1.00 04/10/2021   INR 0.9 05/31/2007   HGBA1C 5.6 03/28/2020    MR PROSTATE W WO CONTRAST  Result Date: 09/20/2019 CLINICAL DATA:  History of prostate cancer and elevated PSA. Biopsy in 2017. EXAM: MR PROSTATE WITHOUT AND WITH CONTRAST TECHNIQUE: Multiplanar multisequence MRI images were obtained of the pelvis centered about the prostate. Pre and post contrast images were obtained. CONTRAST:  10m MULTIHANCE GADOBENATE DIMEGLUMINE 529 MG/ML IV SOLN Creatinine was obtained on site at [facility] [address] Results: Creatinine 0.9 mg/dL. COMPARISON:  None FINDINGS: Prostate: Diffuse T2 hypointensity about the posterior peripheral zone without corresponding restricted diffusion beyond mildly heterogeneous low signal on ADC. No signs of high B value restricted diffusion within the prostate. Area appearing slightly more focal on the left at the left base (image 15 of series 5), coronal images just below the central zone transition. There is some motion which limits assessment in addition to bowel gas. There is some mild asymmetry of enhancement within this area best seen on series 28, subtraction images. Mild BPH changes in the transitional zone. Volume: 40 cc Transcapsular spread:  Absent Seminal vesicle involvement: Absent Neurovascular bundle involvement: Absent Pelvic adenopathy: Absent Bone metastasis: Absent Other findings: Colonic diverticulosis. IMPRESSION: Signs of prostatitis and BPH. Asymmetry of low signal on T2 with limited diffusion and slight asymmetry of enhancement favoring left over right base with diffuse hypointensity in the peripheral zone midgland. Area in the left base to midgland assigned PIRADS category 3 in  light of limited diffusion and other findings outlined above. This could also represent more profound changes of prostatitis. No signs of extracapsular disease or additional lesion of concern. Electronically Signed   By: GZetta BillsM.D.   On: 09/20/2019 10:59    Assessment & Plan:   RTheartiswas seen today for annual exam, hypertension and hyperlipidemia.  Diagnoses and all orders for this visit:  Need for prophylactic vaccination with combined diphtheria-tetanus-pertussis (DTP) vaccine -     Tdap (BOOSTRIX) 5-2.5-18.5 LF-MCG/0.5 injection; Inject 0.5 mLs into the muscle once for 1 dose.  Need for shingles vaccine -     Zoster Vaccine Adjuvanted ([facility] injection; Inject 0.5 mLs into the muscle once for 1 dose.  Essential hypertension- His BP is well controlled. Will screen for CAD. -  CBC with Differential/Platelet; Future -     Basic metabolic panel; Future -     Hepatic function panel; Future -     TSH; Future -     CT CARDIAC SCORING (SELF PAY ONLY); Future -     TSH -     Hepatic function panel -     Basic metabolic panel -     CBC with Differential/Platelet  Dyslipidemia, goal LDL below 130- LDL goal achieved. Doing well on the statin  -     Lipid panel; Future -     Hepatic function panel; Future -     TSH; Future -     CT CARDIAC SCORING (SELF PAY ONLY); Future -     TSH -     Hepatic function panel -     Lipid panel  Routine general medical examination at a health care facility- Exam completed, labs reviewed, vaccines reviewed and updated, cancer screenings are up-to-date, patient education was given.  Prostate nodule without urinary obstruction dx prostate ca/ limited 02/2014 - His PSA not rising. -     PSA; Future -     PSA  Flu vaccine need -     Flu Vaccine QUAD High Dose(Fluad)  I am having Cristopher Estimable. Anastasia Fiedler start on Shingrix and Boostrix. I am also having him maintain his aspirin, multivitamin with minerals, sildenafil, amLODipine, furosemide, and  rosuvastatin.  Meds ordered this encounter  Medications   Zoster Vaccine Adjuvanted [facility]) injection    Sig: Inject 0.5 mLs into the muscle once for 1 dose.    Dispense:  0.5 mL    Refill:  1   Tdap (BOOSTRIX) 5-2.5-18.5 LF-MCG/0.5 injection    Sig: Inject 0.5 mLs into the muscle once for 1 dose.    Dispense:  0.5 mL    Refill:  0     Follow-up: Return in about 6 months (around 10/08/2021).  Scarlette Calico, MD

## 2021-05-19 ENCOUNTER — Other Ambulatory Visit: Payer: Self-pay

## 2021-05-19 ENCOUNTER — Ambulatory Visit (INDEPENDENT_AMBULATORY_CARE_PROVIDER_SITE_OTHER)
Admission: RE | Admit: 2021-05-19 | Discharge: 2021-05-19 | Disposition: A | Discharge status: Home / Self Care | Payer: Self-pay | Source: Ambulatory Visit | Attending: Internal Medicine | Admitting: Internal Medicine

## 2021-05-19 DIAGNOSIS — E785 Hyperlipidemia, unspecified: Secondary | ICD-10-CM

## 2021-05-19 DIAGNOSIS — I10 Essential (primary) hypertension: Secondary | ICD-10-CM

## 2021-06-02 DIAGNOSIS — L570 Actinic keratosis: Secondary | ICD-10-CM | POA: Diagnosis not present

## 2021-06-02 DIAGNOSIS — L821 Other seborrheic keratosis: Secondary | ICD-10-CM | POA: Diagnosis not present

## 2021-06-02 DIAGNOSIS — L812 Freckles: Secondary | ICD-10-CM | POA: Diagnosis not present

## 2021-06-02 DIAGNOSIS — Z85828 Personal history of other malignant neoplasm of skin: Secondary | ICD-10-CM | POA: Diagnosis not present

## 2021-06-04 ENCOUNTER — Other Ambulatory Visit: Payer: Self-pay | Admitting: Internal Medicine

## 2021-06-04 DIAGNOSIS — E785 Hyperlipidemia, unspecified: Secondary | ICD-10-CM

## 2021-06-04 DIAGNOSIS — I10 Essential (primary) hypertension: Secondary | ICD-10-CM

## 2021-06-04 NOTE — Telephone Encounter (Signed)
To PCP please, thanks

## 2021-07-02 ENCOUNTER — Telehealth: Payer: Self-pay

## 2021-07-02 NOTE — Chronic Care Management (AMB) (Signed)
    Chronic Care Management Pharmacy Assistant   Name: Cory Vasquez  MRN: [id number] DOB: [birth year]   Reason for Encounter: Disease State-General Assessment    Recent office visits:  04/10/21 [name], MD-PCP (Annual Exam  Hypertension  Hyperlipidemia) Labs ordered, med changes: tetanus and zoster vac  03/28/21 [name], MD-Internal Medicine (Ear discomfort, left) no orders or med changes  02/21/21 [name], MD-Internal Medicine, video visit (COVID-19 virus infection) no orders or med changes  02/11/21 Colon Branch, MD-Internal Medicine, video visit (COVID-19 virus infection) meds given: molnupiravir  Recent consult visits:  None ID  Hospital visits:  None in previous 6 months  Medications: Outpatient Encounter Medications as of 07/02/2021  Medication Sig   amLODipine (NORVASC) 10 MG tablet TAKE 1 TABLET(10 MG) BY MOUTH DAILY   aspirin 81 MG EC tablet Take 81 mg by mouth daily.   furosemide (LASIX) 20 MG tablet TAKE 1 TABLET(20 MG) BY MOUTH DAILY   Multiple Vitamin (MULTIVITAMIN WITH MINERALS) TABS tablet Take 1 tablet by mouth daily.   rosuvastatin (CRESTOR) 10 MG tablet TAKE 1 TABLET(10 MG) BY MOUTH DAILY   sildenafil (REVATIO) 20 MG tablet Take 20 mg by mouth as needed.   No facility-administered encounter medications on file as of 07/02/2021.   Have you had any problems recently with your health? Patient states that he has not new health issues  Have you had any problems with your pharmacy?Patient states he does not have any issues with getting meds from pharmacy  What issues or side effects are you having with your medications?Patient states not side effects  What would you like me to pass along to [facility] for them to help you with? Patient states he has nothing to discuss at this time  What can we do to take care of you better? Patient states nothing at this time  Care Gaps: Colonoscopy-NA Diabetic Foot Exam-NA Ophthalmology-NA Dexa  Scan - NA Annual Well Visit - 04/10/21 Micro albumin-NA Hemoglobin A1c- 03/28/20  Star Rating Drugs: Rosuvastatin 10 mg-last fill 06/04/21 90 ds  Ethelene Hal Clinical Pharmacist Assistant [phone]

## 2021-12-01 ENCOUNTER — Other Ambulatory Visit: Payer: Self-pay | Admitting: Internal Medicine

## 2021-12-01 DIAGNOSIS — Z85828 Personal history of other malignant neoplasm of skin: Secondary | ICD-10-CM | POA: Diagnosis not present

## 2021-12-01 DIAGNOSIS — L812 Freckles: Secondary | ICD-10-CM | POA: Diagnosis not present

## 2021-12-01 DIAGNOSIS — I10 Essential (primary) hypertension: Secondary | ICD-10-CM

## 2021-12-01 DIAGNOSIS — L570 Actinic keratosis: Secondary | ICD-10-CM | POA: Diagnosis not present

## 2021-12-01 DIAGNOSIS — E785 Hyperlipidemia, unspecified: Secondary | ICD-10-CM

## 2021-12-01 DIAGNOSIS — L814 Other melanin hyperpigmentation: Secondary | ICD-10-CM | POA: Diagnosis not present

## 2022-01-05 ENCOUNTER — Telehealth: Payer: Self-pay | Admitting: *Deleted

## 2022-01-05 NOTE — Chronic Care Management (AMB) (Signed)
Pt called to cancel upcoming PharmD phone appt - pt says appreciative but no longer needs CCM services at this time. Canceled appt as requested

## 2022-01-08 ENCOUNTER — Telehealth: Payer: Medicare Other

## 2022-02-13 ENCOUNTER — Other Ambulatory Visit: Payer: Self-pay | Admitting: Internal Medicine

## 2022-02-13 DIAGNOSIS — E785 Hyperlipidemia, unspecified: Secondary | ICD-10-CM

## 2022-02-13 DIAGNOSIS — I10 Essential (primary) hypertension: Secondary | ICD-10-CM

## 2022-03-01 ENCOUNTER — Other Ambulatory Visit: Payer: Self-pay | Admitting: Internal Medicine

## 2022-03-01 DIAGNOSIS — I10 Essential (primary) hypertension: Secondary | ICD-10-CM

## 2022-03-01 DIAGNOSIS — E785 Hyperlipidemia, unspecified: Secondary | ICD-10-CM

## 2022-04-02 ENCOUNTER — Encounter (HOSPITAL_BASED_OUTPATIENT_CLINIC_OR_DEPARTMENT_OTHER): Payer: Self-pay | Admitting: Emergency Medicine

## 2022-04-02 ENCOUNTER — Other Ambulatory Visit: Payer: Self-pay

## 2022-04-02 ENCOUNTER — Emergency Department (HOSPITAL_BASED_OUTPATIENT_CLINIC_OR_DEPARTMENT_OTHER): Payer: Medicare Other

## 2022-04-02 ENCOUNTER — Emergency Department (HOSPITAL_BASED_OUTPATIENT_CLINIC_OR_DEPARTMENT_OTHER): Payer: Medicare Other | Admitting: Radiology

## 2022-04-02 ENCOUNTER — Emergency Department (HOSPITAL_BASED_OUTPATIENT_CLINIC_OR_DEPARTMENT_OTHER)
Admission: EM | Admit: 2022-04-02 | Discharge: 2022-04-02 | Disposition: A | Discharge status: Home / Self Care | Payer: Medicare Other | Attending: Emergency Medicine | Admitting: Emergency Medicine

## 2022-04-02 DIAGNOSIS — Z7982 Long term (current) use of aspirin: Secondary | ICD-10-CM | POA: Insufficient documentation

## 2022-04-02 DIAGNOSIS — R079 Chest pain, unspecified: Secondary | ICD-10-CM | POA: Diagnosis not present

## 2022-04-02 DIAGNOSIS — R0789 Other chest pain: Secondary | ICD-10-CM | POA: Diagnosis not present

## 2022-04-02 DIAGNOSIS — R0781 Pleurodynia: Secondary | ICD-10-CM | POA: Insufficient documentation

## 2022-04-02 DIAGNOSIS — R911 Solitary pulmonary nodule: Secondary | ICD-10-CM | POA: Diagnosis not present

## 2022-04-02 DIAGNOSIS — Z79899 Other long term (current) drug therapy: Secondary | ICD-10-CM | POA: Diagnosis not present

## 2022-04-02 DIAGNOSIS — I700 Atherosclerosis of aorta: Secondary | ICD-10-CM | POA: Diagnosis not present

## 2022-04-02 LAB — BASIC METABOLIC PANEL
Anion gap: 9 (ref 5–15)
BUN: 18 mg/dL (ref 8–23)
CO2: 26 mmol/L (ref 22–32)
Calcium: 9.3 mg/dL (ref 8.9–10.3)
Chloride: 105 mmol/L (ref 98–111)
Creatinine, Ser: 1.01 mg/dL (ref 0.61–1.24)
GFR, Estimated: >60 mL/min (ref >60)
Glucose, Bld: 144 mg/dL — ABNORMAL HIGH (ref 70–99)
Potassium: 4.2 mmol/L (ref 3.5–5.1)
Sodium: 140 mmol/L (ref 135–145)

## 2022-04-02 LAB — TROPONIN I (HIGH SENSITIVITY)
Troponin I (High Sensitivity): 2 ng/L (ref <18)
Troponin I (High Sensitivity): <2 ng/L (ref <18)

## 2022-04-02 LAB — CBC
HCT: 43.6 % (ref 39.0–52.0)
Hemoglobin: 15 g/dL (ref 13.0–17.0)
MCH: 32.4 pg (ref 26.0–34.0)
MCHC: 34.4 g/dL (ref 30.0–36.0)
MCV: 94.2 fL (ref 80.0–100.0)
Platelets: 283 10*3/uL (ref 150–400)
RBC: 4.63 MIL/uL (ref 4.22–5.81)
RDW: 13.1 % (ref 11.5–15.5)
WBC: 9.4 10*3/uL (ref 4.0–10.5)
nRBC: 0 % (ref 0.0–0.2)

## 2022-04-02 MED ORDER — IOHEXOL 350 MG/ML SOLN
100.0000 mL | Freq: Once | INTRAVENOUS | Status: AC | PRN
  Administered 2022-04-02: 75 mL via INTRAVENOUS

## 2022-04-02 MED ORDER — KETOROLAC TROMETHAMINE 30 MG/ML IJ SOLN
30.0000 mg | Freq: Once | INTRAMUSCULAR | Status: AC
  Administered 2022-04-02: 30 mg via INTRAVENOUS
  Filled 2022-04-02: qty 1

## 2022-04-02 NOTE — ED Notes (Signed)
Discharge paperwork given and verbally understood. 

## 2022-04-02 NOTE — ED Provider Notes (Signed)
[facility] EMERGENCY DEPT Provider Note   CSN: [id number] Arrival date & time: 04/02/22  1131     History  Chief Complaint  Patient presents with   Chest Pain    Cory Vasquez is a 76 y.o. male presenting emergency department with right-sided chest discomfort.  The patient reports his pain began approximately 2 days ago.  He has noted some exertional pleuritic pain on the right side of his chest, worse with movement.  He says it feels like a "pulled muscle" in the side of his chest.  He says he feels he cannot take a deep breath because of the discomfort.  He has never had this sensation before.  He denies nausea, vomiting, diarrhea.  Pain is waxing and waning but worsened this morning prompting his visit.  He denies lightheadedness.  Denies leg swelling unilaterally, calf pain, history of DVT or PE, recent surgery or prolonged immobilization or travel.  Has a history of "palpitations" in terms of cardiac problems, but denies history of MI or coronary disease.  External records shows the patient had a CT coronary calcium scoring performed in October 2022, approximately 1 year ago, with a 29 percentile score, and a calcium score 65 specifically in the left anterior descending artery, and 0 in the remaining arteries.  HPI     Home Medications Prior to Admission medications   Medication Sig Start Date End Date Taking? Authorizing Provider  amLODipine (NORVASC) 10 MG tablet TAKE 1 TABLET(10 MG) BY MOUTH DAILY 02/13/22   [name], MD  aspirin 81 MG EC tablet Take 81 mg by mouth daily.    [provider]  furosemide (LASIX) 20 MG tablet TAKE 1 TABLET(20 MG) BY MOUTH DAILY 02/13/22   [name], MD  Multiple Vitamin (MULTIVITAMIN WITH MINERALS) TABS tablet Take 1 tablet by mouth daily.    [provider]  rosuvastatin (CRESTOR) 10 MG tablet TAKE 1 TABLET(10 MG) BY MOUTH DAILY 02/13/22   [name], MD  sildenafil (REVATIO) 20 MG tablet Take  20 mg by mouth as needed.    [provider]      Allergies    Patient has no known allergies.    Review of Systems   Review of Systems  Physical Exam Updated Vital Signs BP 136/70   Pulse 60   Temp 98.3 F (36.8 C)   Resp 17   Ht 6' (1.829 m)   Wt 87.1 kg   SpO2 97%   BMI 26.04 kg/m  Physical Exam Constitutional:      General: He is not in acute distress. HENT:     Head: Normocephalic and atraumatic.  Eyes:     Conjunctiva/sclera: Conjunctivae normal.     Pupils: Pupils are equal, round, and reactive to light.  Cardiovascular:     Rate and Rhythm: Normal rate and regular rhythm.  Pulmonary:     Effort: Pulmonary effort is normal. No respiratory distress.  Abdominal:     General: There is no distension.     Tenderness: There is no abdominal tenderness.  Musculoskeletal:     Comments: Pain is reproducible with palpation below his right scapula and with right arm raise and movement.  Skin:    General: Skin is warm and dry.  Neurological:     General: No focal deficit present.     Mental Status: He is alert. Mental status is at baseline.  Psychiatric:        Mood and Affect: Mood normal.  Behavior: Behavior normal.     ED Results / Procedures / Treatments   Labs (all labs ordered are listed, but only abnormal results are displayed) Labs Reviewed  BASIC METABOLIC PANEL - Abnormal; Notable for the following components:      Result Value   Glucose, Bld 144 (*)    All other components within normal limits  CBC  TROPONIN I (HIGH SENSITIVITY)  TROPONIN I (HIGH SENSITIVITY)    EKG EKG Interpretation  Date/Time:  Thursday April 02 2022 11:38:15 EDT Ventricular Rate:  60 PR Interval:  192 QRS Duration: 76 QT Interval:  384 QTC Calculation: 384 R Axis:   37 Text Interpretation: Normal sinus rhythm Low voltage QRS When compared with ECG of 19-Feb-2016 12:32, PREVIOUS ECG IS PRESENT No STEMI Confirmed by Octaviano Glow [id number] on 04/02/2022  12:20:31 PM  Radiology CT Angio Chest PE W and/or Wo Contrast  Result Date: 04/02/2022 CLINICAL DATA:  Pulmonary embolism (PE) suspected, high prob right sided pleuritic chest pain x 2 days EXAM: CT ANGIOGRAPHY CHEST WITH CONTRAST TECHNIQUE: Multidetector CT imaging of the chest was performed using the standard protocol during bolus administration of intravenous contrast. Multiplanar CT image reconstructions and MIPs were obtained to evaluate the vascular anatomy. RADIATION DOSE REDUCTION: This exam was performed according to the departmental dose-optimization program which includes automated exposure control, adjustment of the mA and/or kV according to patient size and/or use of iterative reconstruction technique. CONTRAST:  66m OMNIPAQUE IOHEXOL 350 MG/ML SOLN COMPARISON:  Chest x-ray from the same day. FINDINGS: Cardiovascular: Satisfactory opacification of the pulmonary arteries to the segmental level. No evidence of pulmonary embolism. Normal heart size. No pericardial effusion. Calcific atherosclerosis of the aorta. Mediastinum/Nodes: No enlarged mediastinal, hilar, or axillary lymph nodes. Thyroid gland, trachea, and esophagus demonstrate no significant findings. Lungs/Pleura: Approximately 4 mm pulmonary nodule in the left lower lobe. (Series 6, image 107). Mild dependent ground-glass opacities, probably atelectasis. No consolidation. No pleural effusions or pneumothorax. Upper Abdomen: No acute abnormality. Musculoskeletal: No evidence of acute fracture multilevel degenerative change in the thoracic spine including bridging osteophytes. Review of the MIP images confirms the above findings. IMPRESSION: 1. No evidence of acute pulmonary embolism. 2. Approximately 4 mm pulmonary nodule in the left lower lobe. No follow-up needed if patient is low-risk.This recommendation follows the consensus statement: Guidelines for Management of Incidental Pulmonary Nodules Detected on CT Images: From the Fleischner  Society 2017; Radiology 2017; 284:228-243. 3. Aortic Atherosclerosis (ICD10-I70.0). Electronically Signed   By: FMargaretha SheffieldM.D.   On: 04/02/2022 13:21   DG Chest 2 View  Result Date: 04/02/2022 CLINICAL DATA:  Chest pain EXAM: CHEST - 2 VIEW COMPARISON:  03/28/2020 FINDINGS: Artifact from EKG pads. Normal heart size and mediastinal contours. No acute infiltrate or edema. No effusion or pneumothorax. Lower thoracic endplate spurring. No acute osseous findings. IMPRESSION: No active cardiopulmonary disease. Electronically Signed   By: JJorje GuildM.D.   On: 04/02/2022 12:17    Procedures Procedures    Medications Ordered in ED Medications  iohexol (OMNIPAQUE) 350 MG/ML injection 100 mL (75 mLs Intravenous Contrast Given 04/02/22 1302)  ketorolac (TORADOL) 30 MG/ML injection 30 mg (30 mg Intravenous Given 04/02/22 1422)    ED Course/ Medical Decision Making/ A&P Clinical Course as of 04/02/22 1518  Thu Apr 02, 2022  1501 Assumed care from Dr TLangston Masker 76yo M who presented with reproducible CP thought to be MSK. Awaiting 2nd troponin now.  [RP]  [id number] reported significant improvement of his  pain after Toradol.  I recommend he continue ibuprofen at home.  Signed out to Dr Philip Aspen pending 2nd trop [MT]    Clinical Course User Index [MT] Shina Wass, Carola Rhine, MD [RP] Fransico Meadow, MD                           Medical Decision Making Amount and/or Complexity of Data Reviewed Labs: ordered. Radiology: ordered.  Risk Prescription drug management.   This patient presents to the Emergency Department with complaint of chest pain. This involves an extensive number of treatment options, and is a complaint that carries with it a high risk of complications and morbidity, given the patient's comorbidies .The differential diagnosis includes ACS vs Pneumothorax vs Reflux/Gastritis vs MSK pain vs Pneumonia vs other.  Clinically this pain could be very consistent with a  musculoskeletal issue.  It is quite reproducible with movement of the right arm.  There is no traumatic injury to suspect that there is an underlying fracture, but I wonder whether he may have a tear along the rotator cuff muscles or a lateral chest wall injury.  However, pulmonary embolism is also on the differential, we discussed CT PE scan, which he agreed to.  I ordered, reviewed, and interpreted labs.  Pertinent results include flat, labs unremarkable I ordered imaging studies which included x-ray of the chest, CT PE I independently visualized and interpreted imaging which showed no acute emergent findings to correlate with the patient's symptoms.  Incidental pulmonary nodule noted; however he is low risk for lung cancer.  And the monitor tracing which showed normal sinus rhythm. I agree with the radiologist interpretation External records obtained and reviewed showing coronary calcium CT score I personally reviewed the patients ECG which showed sinus rhythm with no acute ischemic findings  IV Toradol was ordered for suspected musculoskeletal pain.           Final Clinical Impression(s) / ED Diagnoses Final diagnoses:  Right-sided chest pain    Rx / DC Orders ED Discharge Orders     None         Wyvonnia Dusky, MD 04/02/22 [phone]

## 2022-04-02 NOTE — Discharge Instructions (Signed)
Your chest pain may be related to muscle injury in your right arm or shoulder rotator cuff.  I recommend you take ibuprofen or Tylenol as needed for pain at home.  I expect your pain to improve over the next few days.  Your work-up today did not show signs of a heart attack in the ED or blood clots in your lungs, or pneumonia, or other life-threatening emergency.  However I did recommend you still follow-up with your cardiologist, as you do have some plaque buildup in your heart from your coronary CT scan 1 year ago.  Please call the cardiologist office to arrange for follow-up appointment.  If your pain is worsening, especially if it is associated with sweating, lightheadedness, sudden nausea, or feel like passing out, please call 911 and come back to the ER immediately.

## 2022-04-02 NOTE — ED Triage Notes (Signed)
Pt arrives to ED with c/o chest pain. The CP started last night gradually and is located right sided. The pain radiates to his right arm and right shoulder blade. HEW reports he stopped his morning walk early this morning because he wasn't able to take a deep breath.

## 2022-04-02 NOTE — ED Provider Notes (Signed)
  Physical Exam  BP (!) 152/71 (BP Location: Left Arm)   Pulse (!) 56   Temp 98.3 F (36.8 C)   Resp 15   Ht 6' (1.829 m)   Wt 87.1 kg   SpO2 98%   BMI 26.04 kg/m   Physical Exam  Procedures  Procedures  ED Course / MDM   Clinical Course as of 04/03/22 2039  Thu Apr 02, 2022  1501 Assumed care from Dr Langston Masker. 76 yo M who presented with reproducible CP thought to be MSK. Awaiting 2nd troponin now.  [RP]  [id number] Patient reported significant improvement of his pain after Toradol.  I recommend he continue ibuprofen at home.  Signed out to Dr Philip Aspen pending 2nd trop [MT]  1600 Second troponin returned is normal.  Evaluated the patient and he is overall doing well.  Does state that the pain is reproducible with shoulder motion.  Informed him that it is likely musculoskeletal in etiology given his reassuring evaluation today.  We will have the patient follow-up with his primary doctor in several days regarding his work-up.  Return precautions discussed prior to discharge. [RP]    Clinical Course User Index [MT] Trifan, Carola Rhine, MD [RP] Fransico Meadow, MD   Medical Decision Making Amount and/or Complexity of Data Reviewed Labs: ordered. Radiology: ordered.  Risk Prescription drug management.     Fransico Meadow, MD 04/03/22 2039

## 2022-04-07 ENCOUNTER — Ambulatory Visit (INDEPENDENT_AMBULATORY_CARE_PROVIDER_SITE_OTHER): Payer: Medicare Other | Admitting: Internal Medicine

## 2022-04-07 ENCOUNTER — Encounter: Payer: Self-pay | Admitting: Internal Medicine

## 2022-04-07 VITALS — BP 136/76 | HR 69 | Temp 98.0°F | Resp 16 | Ht 72.0 in | Wt 195.0 lb

## 2022-04-07 DIAGNOSIS — Z0000 Encounter for general adult medical examination without abnormal findings: Secondary | ICD-10-CM | POA: Diagnosis not present

## 2022-04-07 DIAGNOSIS — I10 Essential (primary) hypertension: Secondary | ICD-10-CM

## 2022-04-07 DIAGNOSIS — E785 Hyperlipidemia, unspecified: Secondary | ICD-10-CM

## 2022-04-07 DIAGNOSIS — Z23 Encounter for immunization: Secondary | ICD-10-CM | POA: Diagnosis not present

## 2022-04-07 DIAGNOSIS — R739 Hyperglycemia, unspecified: Secondary | ICD-10-CM | POA: Diagnosis not present

## 2022-04-07 LAB — LIPID PANEL
Cholesterol: 141 mg/dL (ref 0–200)
HDL: 62.5 mg/dL (ref >39.00)
LDL Cholesterol: 57 mg/dL (ref 0–99)
NonHDL: 78.48
Total CHOL/HDL Ratio: 2
Triglycerides: 105 mg/dL (ref 0.0–149.0)
VLDL: 21 mg/dL (ref 0.0–40.0)

## 2022-04-07 LAB — HEPATIC FUNCTION PANEL
ALT: 20 U/L (ref 0–53)
AST: 25 U/L (ref 0–37)
Albumin: 4 g/dL (ref 3.5–5.2)
Alkaline Phosphatase: 85 U/L (ref 39–117)
Bilirubin, Direct: 0.1 mg/dL (ref 0.0–0.3)
Total Bilirubin: 0.4 mg/dL (ref 0.2–1.2)
Total Protein: 7.4 g/dL (ref 6.0–8.3)

## 2022-04-07 LAB — HEMOGLOBIN A1C: Hgb A1c MFr Bld: 5.9 % (ref 4.6–6.5)

## 2022-04-07 MED ORDER — AMLODIPINE BESYLATE 10 MG PO TABS: 10.0000 mg | ORAL_TABLET | Freq: Every day | ORAL | 1 refills | Status: DC

## 2022-04-07 MED ORDER — ROSUVASTATIN CALCIUM 10 MG PO TABS: 10.0000 mg | ORAL_TABLET | Freq: Every day | ORAL | 1 refills | Status: DC

## 2022-04-07 MED ORDER — FUROSEMIDE 20 MG PO TABS: 20.0000 mg | ORAL_TABLET | Freq: Every day | ORAL | 1 refills | Status: DC

## 2022-04-07 NOTE — Progress Notes (Signed)
Subjective:  Patient ID: Cory Vasquez, male    DOB: 07/03/1946  Age: 76 y.o. MRN: [id number]  CC: Annual Exam, Hypertension, and Hyperlipidemia   HPI Cory Vasquez presents for a CPX and f/up -  He was recently seen in the ED for right upper chest, right shoulder pain.  His work-up was negative and those symptoms have resolved.  He denies chest pain, diaphoresis, dizziness, lightheadedness, or edema.  Outpatient Medications Prior to Visit  Medication Sig Dispense Refill   aspirin 81 MG EC tablet Take 81 mg by mouth daily.     Multiple Vitamin (MULTIVITAMIN WITH MINERALS) TABS tablet Take 1 tablet by mouth daily.     sildenafil (REVATIO) 20 MG tablet Take 20 mg by mouth as needed.     amLODipine (NORVASC) 10 MG tablet TAKE 1 TABLET(10 MG) BY MOUTH DAILY 90 tablet 0   furosemide (LASIX) 20 MG tablet TAKE 1 TABLET(20 MG) BY MOUTH DAILY 90 tablet 0   rosuvastatin (CRESTOR) 10 MG tablet TAKE 1 TABLET(10 MG) BY MOUTH DAILY 90 tablet 0   No facility-administered medications prior to visit.    ROS Review of Systems  Constitutional: Negative.  Negative for appetite change, diaphoresis and fatigue.  HENT: Negative.    Eyes: Negative.   Respiratory: Negative.  Negative for cough, chest tightness, shortness of breath and wheezing.   Cardiovascular:  Negative for chest pain, palpitations and leg swelling.  Gastrointestinal:  Negative for abdominal pain, constipation, diarrhea and nausea.  Endocrine: Negative.   Genitourinary: Negative.   Musculoskeletal: Negative.  Negative for arthralgias and myalgias.  Skin: Negative.   Allergic/Immunologic: Negative.   Neurological:  Negative for dizziness, weakness and light-headedness.  Hematological:  Negative for adenopathy. Does not bruise/bleed easily.  Psychiatric/Behavioral: Negative.      Objective:  BP 136/76 (BP Location: Left Arm, Patient Position: Sitting, Cuff Size: Large)   Pulse 69   Temp 98 F (36.7 C) (Oral)   Resp 16    Ht 6' (1.829 m)   Wt 195 lb (88.5 kg)   SpO2 98%   BMI 26.45 kg/m   BP Readings from Last 3 Encounters:  04/07/22 136/76  04/02/22 (!) 152/71  04/10/21 122/72    Wt Readings from Last 3 Encounters:  04/07/22 195 lb (88.5 kg)  04/02/22 192 lb (87.1 kg)  04/10/21 188 lb (85.3 kg)    Physical Exam Vitals reviewed.  HENT:     Nose: Nose normal.     Mouth/Throat:     Mouth: Mucous membranes are moist.  Eyes:     General: No scleral icterus.    Conjunctiva/sclera: Conjunctivae normal.  Cardiovascular:     Rate and Rhythm: Normal rate and regular rhythm.     Heart sounds: No murmur heard. Pulmonary:     Effort: Pulmonary effort is normal.     Breath sounds: Normal breath sounds. No stridor. No wheezing, rhonchi or rales.  Abdominal:     General: Abdomen is flat.     Palpations: There is no mass.     Tenderness: There is no abdominal tenderness. There is no guarding.     Hernia: No hernia is present.  Musculoskeletal:        General: Normal range of motion.     Cervical back: Neck supple.     Right lower leg: No edema.     Left lower leg: No edema.  Lymphadenopathy:     Cervical: No cervical adenopathy.  Skin:    General:  Skin is warm and dry.  Neurological:     General: No focal deficit present.     Mental Status: He is alert. Mental status is at baseline.  Psychiatric:        Mood and Affect: Mood normal.        Behavior: Behavior normal.     Lab Results  Component Value Date   WBC 9.4 04/02/2022   HGB 15.0 04/02/2022   HCT 43.6 04/02/2022   PLT 283 04/02/2022   GLUCOSE 144 (H) 04/02/2022   CHOL 141 04/07/2022   TRIG 105.0 04/07/2022   HDL 62.50 04/07/2022   LDLCALC 57 04/07/2022   ALT 20 04/07/2022   AST 25 04/07/2022   NA 140 04/02/2022   K 4.2 04/02/2022   CL 105 04/02/2022   CREATININE 1.01 04/02/2022   BUN 18 04/02/2022   CO2 26 04/02/2022   TSH 1.17 04/10/2021   PSA 1.00 04/10/2021   INR 0.9 05/31/2007   HGBA1C 5.9 04/07/2022    CT  Angio Chest PE W and/or Wo Contrast  Result Date: 04/02/2022 CLINICAL DATA:  Pulmonary embolism (PE) suspected, high prob right sided pleuritic chest pain x 2 days EXAM: CT ANGIOGRAPHY CHEST WITH CONTRAST TECHNIQUE: Multidetector CT imaging of the chest was performed using the standard protocol during bolus administration of intravenous contrast. Multiplanar CT image reconstructions and MIPs were obtained to evaluate the vascular anatomy. RADIATION DOSE REDUCTION: This exam was performed according to the departmental dose-optimization program which includes automated exposure control, adjustment of the mA and/or kV according to patient size and/or use of iterative reconstruction technique. CONTRAST:  68m OMNIPAQUE IOHEXOL 350 MG/ML SOLN COMPARISON:  Chest x-ray from the same day. FINDINGS: Cardiovascular: Satisfactory opacification of the pulmonary arteries to the segmental level. No evidence of pulmonary embolism. Normal heart size. No pericardial effusion. Calcific atherosclerosis of the aorta. Mediastinum/Nodes: No enlarged mediastinal, hilar, or axillary lymph nodes. Thyroid gland, trachea, and esophagus demonstrate no significant findings. Lungs/Pleura: Approximately 4 mm pulmonary nodule in the left lower lobe. (Series 6, image 107). Mild dependent ground-glass opacities, probably atelectasis. No consolidation. No pleural effusions or pneumothorax. Upper Abdomen: No acute abnormality. Musculoskeletal: No evidence of acute fracture multilevel degenerative change in the thoracic spine including bridging osteophytes. Review of the MIP images confirms the above findings. IMPRESSION: 1. No evidence of acute pulmonary embolism. 2. Approximately 4 mm pulmonary nodule in the left lower lobe. No follow-up needed if patient is low-risk.This recommendation follows the consensus statement: Guidelines for Management of Incidental Pulmonary Nodules Detected on CT Images: From the Fleischner Society 2017; Radiology 2017;  284:228-243. 3. Aortic Atherosclerosis (ICD10-I70.0). Electronically Signed   By: FMargaretha SheffieldM.D.   On: 04/02/2022 13:21   DG Chest 2 View  Result Date: 04/02/2022 CLINICAL DATA:  Chest pain EXAM: CHEST - 2 VIEW COMPARISON:  03/28/2020 FINDINGS: Artifact from EKG pads. Normal heart size and mediastinal contours. No acute infiltrate or edema. No effusion or pneumothorax. Lower thoracic endplate spurring. No acute osseous findings. IMPRESSION: No active cardiopulmonary disease. Electronically Signed   By: JJorje GuildM.D.   On: 04/02/2022 12:17    Assessment & Plan:   RDaiseanwas seen today for annual exam, hypertension and hyperlipidemia.  Diagnoses and all orders for this visit:  Chronic hyperglycemia- His A1c is normal. -     Hemoglobin A1c; Future -     Hemoglobin A1c  Essential hypertension- His blood pressure is well controlled. -     Hepatic function panel; Future -  amLODipine (NORVASC) 10 MG tablet; Take 1 tablet (10 mg total) by mouth daily. -     furosemide (LASIX) 20 MG tablet; Take 1 tablet (20 mg total) by mouth daily. -     Hepatic function panel  Dyslipidemia, goal LDL below 130- LDL goal achieved. Doing well on the statin  -     Lipid panel; Future -     Hepatic function panel; Future -     rosuvastatin (CRESTOR) 10 MG tablet; Take 1 tablet (10 mg total) by mouth daily. -     Hepatic function panel -     Lipid panel  Routine general medical examination at a health care facility- Exam completed, labs reviewed, vaccines reviewed and updated, no cancer screenings indicated, patient education was given.  Flu vaccine need -     Flu Vaccine QUAD High Dose(Fluad)   I have changed Cristopher Estimable. Mahlum's amLODipine, furosemide, and rosuvastatin. I am also having him maintain his aspirin EC, multivitamin with minerals, and sildenafil.  Meds ordered this encounter  Medications   amLODipine (NORVASC) 10 MG tablet    Sig: Take 1 tablet (10 mg total) by mouth daily.     Dispense:  90 tablet    Refill:  1   furosemide (LASIX) 20 MG tablet    Sig: Take 1 tablet (20 mg total) by mouth daily.    Dispense:  90 tablet    Refill:  1   rosuvastatin (CRESTOR) 10 MG tablet    Sig: Take 1 tablet (10 mg total) by mouth daily.    Dispense:  90 tablet    Refill:  1     Follow-up: Return in about 6 months (around 10/06/2022).  Scarlette Calico, MD

## 2022-04-07 NOTE — Patient Instructions (Signed)
Health Maintenance, Male Adopting a healthy lifestyle and getting preventive care are important in promoting health and wellness. Ask your health care provider about: The right schedule for you to have regular tests and exams. Things you can do on your own to prevent diseases and keep yourself healthy. What should I know about diet, weight, and exercise? Eat a healthy diet  Eat a diet that includes plenty of vegetables, fruits, low-fat dairy products, and lean protein. Do not eat a lot of foods that are high in solid fats, added sugars, or sodium. Maintain a healthy weight Body mass index (BMI) is a measurement that can be used to identify possible weight problems. It estimates body fat based on height and weight. Your health care provider can help determine your BMI and help you achieve or maintain a healthy weight. Get regular exercise Get regular exercise. This is one of the most important things you can do for your health. Most adults should: Exercise for at least 150 minutes each week. The exercise should increase your heart rate and make you sweat (moderate-intensity exercise). Do strengthening exercises at least twice a week. This is in addition to the moderate-intensity exercise. Spend less time sitting. Even light physical activity can be beneficial. Watch cholesterol and blood lipids Have your blood tested for lipids and cholesterol at 75 years of age, then have this test every 5 years. You may need to have your cholesterol levels checked more often if: Your lipid or cholesterol levels are high. You are older than 75 years of age. You are at high risk for heart disease. What should I know about cancer screening? Many types of cancers can be detected early and may often be prevented. Depending on your health history and family history, you may need to have cancer screening at various ages. This may include screening for: Colorectal cancer. Prostate cancer. Skin cancer. Lung  cancer. What should I know about heart disease, diabetes, and high blood pressure? Blood pressure and heart disease High blood pressure causes heart disease and increases the risk of stroke. This is more likely to develop in people who have high blood pressure readings or are overweight. Talk with your health care provider about your target blood pressure readings. Have your blood pressure checked: Every 3-5 years if you are 18-39 years of age. Every year if you are 40 years old or older. If you are between the ages of 65 and 75 and are a current or former smoker, ask your health care provider if you should have a one-time screening for abdominal aortic aneurysm (AAA). Diabetes Have regular diabetes screenings. This checks your fasting blood sugar level. Have the screening done: Once every three years after age 45 if you are at a normal weight and have a low risk for diabetes. More often and at a younger age if you are overweight or have a high risk for diabetes. What should I know about preventing infection? Hepatitis B If you have a higher risk for hepatitis B, you should be screened for this virus. Talk with your health care provider to find out if you are at risk for hepatitis B infection. Hepatitis C Blood testing is recommended for: Everyone born from 1945 through 1965. Anyone with known risk factors for hepatitis C. Sexually transmitted infections (STIs) You should be screened each year for STIs, including gonorrhea and chlamydia, if: You are sexually active and are younger than 75 years of age. You are older than 75 years of age and your   health care provider tells you that you are at risk for this type of infection. Your sexual activity has changed since you were last screened, and you are at increased risk for chlamydia or gonorrhea. Ask your health care provider if you are at risk. Ask your health care provider about whether you are at high risk for HIV. Your health care provider  may recommend a prescription medicine to help prevent HIV infection. If you choose to take medicine to prevent HIV, you should first get tested for HIV. You should then be tested every 3 months for as long as you are taking the medicine. Follow these instructions at home: Alcohol use Do not drink alcohol if your health care provider tells you not to drink. If you drink alcohol: Limit how much you have to 0-2 drinks a day. Know how much alcohol is in your drink. In the [place] one drink equals one 12 oz bottle of beer (355 mL), one 5 oz glass of wine (148 mL), or one 1 oz glass of hard liquor (44 mL). Lifestyle Do not use any products that contain nicotine or tobacco. These products include cigarettes, chewing tobacco, and vaping devices, such as e-cigarettes. If you need help quitting, ask your health care provider. Do not use street drugs. Do not share needles. Ask your health care provider for help if you need support or information about quitting drugs. General instructions Schedule regular health, dental, and eye exams. Stay current with your vaccines. Tell your health care provider if: You often feel depressed. You have ever been abused or do not feel safe at home. Summary Adopting a healthy lifestyle and getting preventive care are important in promoting health and wellness. Follow your health care provider's instructions about healthy diet, exercising, and getting tested or screened for diseases. Follow your health care provider's instructions on monitoring your cholesterol and blood pressure. This information is not intended to replace advice given to you by your health care provider. Make sure you discuss any questions you have with your health care provider. Document Revised: 12/02/2020 Document Reviewed: 12/02/2020 Elsevier Patient Education  2023 [employer] Inc.  

## 2022-04-08 ENCOUNTER — Telehealth: Payer: Self-pay | Admitting: Internal Medicine

## 2022-04-08 NOTE — Telephone Encounter (Signed)
LVM for pt to rtn my call to schedule AWV with [facility] call back # [phone] 

## 2022-04-20 ENCOUNTER — Encounter: Payer: Self-pay | Admitting: Internal Medicine

## 2022-04-20 ENCOUNTER — Ambulatory Visit (INDEPENDENT_AMBULATORY_CARE_PROVIDER_SITE_OTHER): Payer: Medicare Other | Admitting: Internal Medicine

## 2022-04-20 VITALS — BP 122/68 | HR 78 | Temp 98.4°F | Ht 72.0 in | Wt 196.0 lb

## 2022-04-20 DIAGNOSIS — G8929 Other chronic pain: Secondary | ICD-10-CM | POA: Insufficient documentation

## 2022-04-20 DIAGNOSIS — I10 Essential (primary) hypertension: Secondary | ICD-10-CM | POA: Diagnosis not present

## 2022-04-20 DIAGNOSIS — M25511 Pain in right shoulder: Secondary | ICD-10-CM

## 2022-04-20 NOTE — Progress Notes (Signed)
Subjective:  Patient ID: AUGUSTIN BUN, male    DOB: 11/27/1945  Age: 76 y.o. MRN: [id number]  CC: Hypertension   HPI DORWIN FITZHENRY presents for f/up -  He continues to complain of right shoulder pain and decreasing range of motion.  He is controlling the pain with Tylenol.  He describes the pain as a throbbing sensation.  He has arthritis in his neck but does not have much neck pain.  He denies upper extremity paresthesias.  Outpatient Medications Prior to Visit  Medication Sig Dispense Refill   amLODipine (NORVASC) 10 MG tablet Take 1 tablet (10 mg total) by mouth daily. 90 tablet 1   aspirin 81 MG EC tablet Take 81 mg by mouth daily.     furosemide (LASIX) 20 MG tablet Take 1 tablet (20 mg total) by mouth daily. 90 tablet 1   Multiple Vitamin (MULTIVITAMIN WITH MINERALS) TABS tablet Take 1 tablet by mouth daily.     rosuvastatin (CRESTOR) 10 MG tablet Take 1 tablet (10 mg total) by mouth daily. 90 tablet 1   sildenafil (REVATIO) 20 MG tablet Take 20 mg by mouth as needed.     No facility-administered medications prior to visit.    ROS Review of Systems  Constitutional: Negative.  Negative for chills, diaphoresis, fatigue and fever.  HENT: Negative.    Eyes: Negative.   Respiratory:  Negative for cough, chest tightness, shortness of breath and wheezing.   Cardiovascular:  Negative for chest pain, palpitations and leg swelling.  Gastrointestinal:  Negative for abdominal pain, constipation, diarrhea, nausea and vomiting.  Endocrine: Negative.   Genitourinary: Negative.  Negative for difficulty urinating.  Musculoskeletal:  Positive for arthralgias. Negative for back pain and myalgias.  Skin:  Negative for color change.  Neurological: Negative.  Negative for dizziness and weakness.  Hematological:  Negative for adenopathy. Does not bruise/bleed easily.  Psychiatric/Behavioral: Negative.      Objective:  BP 122/68 (BP Location: Left Arm, Patient Position: Sitting, Cuff  Size: Large)   Pulse 78   Temp 98.4 F (36.9 C) (Oral)   Ht 6' (1.829 m)   Wt 196 lb (88.9 kg)   SpO2 97%   BMI 26.58 kg/m   BP Readings from Last 3 Encounters:  04/20/22 122/68  04/07/22 136/76  04/02/22 (!) 152/71    Wt Readings from Last 3 Encounters:  04/20/22 196 lb (88.9 kg)  04/07/22 195 lb (88.5 kg)  04/02/22 192 lb (87.1 kg)    Physical Exam Vitals reviewed.  HENT:     Mouth/Throat:     Mouth: Mucous membranes are moist.  Eyes:     General: No scleral icterus.    Conjunctiva/sclera: Conjunctivae normal.  Cardiovascular:     Rate and Rhythm: Normal rate and regular rhythm.     Heart sounds: No murmur heard. Pulmonary:     Breath sounds: No stridor. No wheezing, rhonchi or rales.  Abdominal:     General: Abdomen is flat.     Palpations: There is no mass.     Tenderness: There is no abdominal tenderness. There is no guarding.     Hernia: No hernia is present.  Musculoskeletal:        General: No swelling.     Right shoulder: No swelling, deformity or bony tenderness. Decreased range of motion.     Left shoulder: Normal.     Cervical back: Neck supple.     Right lower leg: No edema.     Left lower  leg: No edema.  Lymphadenopathy:     Cervical: No cervical adenopathy.  Skin:    General: Skin is warm and dry.  Neurological:     General: No focal deficit present.     Mental Status: He is alert.     Lab Results  Component Value Date   WBC 9.4 04/02/2022   HGB 15.0 04/02/2022   HCT 43.6 04/02/2022   PLT 283 04/02/2022   GLUCOSE 144 (H) 04/02/2022   CHOL 141 04/07/2022   TRIG 105.0 04/07/2022   HDL 62.50 04/07/2022   LDLCALC 57 04/07/2022   ALT 20 04/07/2022   AST 25 04/07/2022   NA 140 04/02/2022   K 4.2 04/02/2022   CL 105 04/02/2022   CREATININE 1.01 04/02/2022   BUN 18 04/02/2022   CO2 26 04/02/2022   TSH 1.17 04/10/2021   PSA 1.00 04/10/2021   INR 0.9 05/31/2007   HGBA1C 5.9 04/07/2022    CT Angio Chest PE W and/or Wo  Contrast  Result Date: 04/02/2022 CLINICAL DATA:  Pulmonary embolism (PE) suspected, high prob right sided pleuritic chest pain x 2 days EXAM: CT ANGIOGRAPHY CHEST WITH CONTRAST TECHNIQUE: Multidetector CT imaging of the chest was performed using the standard protocol during bolus administration of intravenous contrast. Multiplanar CT image reconstructions and MIPs were obtained to evaluate the vascular anatomy. RADIATION DOSE REDUCTION: This exam was performed according to the departmental dose-optimization program which includes automated exposure control, adjustment of the mA and/or kV according to patient size and/or use of iterative reconstruction technique. CONTRAST:  48m OMNIPAQUE IOHEXOL 350 MG/ML SOLN COMPARISON:  Chest x-ray from the same day. FINDINGS: Cardiovascular: Satisfactory opacification of the pulmonary arteries to the segmental level. No evidence of pulmonary embolism. Normal heart size. No pericardial effusion. Calcific atherosclerosis of the aorta. Mediastinum/Nodes: No enlarged mediastinal, hilar, or axillary lymph nodes. Thyroid gland, trachea, and esophagus demonstrate no significant findings. Lungs/Pleura: Approximately 4 mm pulmonary nodule in the left lower lobe. (Series 6, image 107). Mild dependent ground-glass opacities, probably atelectasis. No consolidation. No pleural effusions or pneumothorax. Upper Abdomen: No acute abnormality. Musculoskeletal: No evidence of acute fracture multilevel degenerative change in the thoracic spine including bridging osteophytes. Review of the MIP images confirms the above findings. IMPRESSION: 1. No evidence of acute pulmonary embolism. 2. Approximately 4 mm pulmonary nodule in the left lower lobe. No follow-up needed if patient is low-risk.This recommendation follows the consensus statement: Guidelines for Management of Incidental Pulmonary Nodules Detected on CT Images: From the Fleischner Society 2017; Radiology 2017; 284:228-243. 3. Aortic  Atherosclerosis (ICD10-I70.0). Electronically Signed   By: FMargaretha SheffieldM.D.   On: 04/02/2022 13:21   DG Chest 2 View  Result Date: 04/02/2022 CLINICAL DATA:  Chest pain EXAM: CHEST - 2 VIEW COMPARISON:  03/28/2020 FINDINGS: Artifact from EKG pads. Normal heart size and mediastinal contours. No acute infiltrate or edema. No effusion or pneumothorax. Lower thoracic endplate spurring. No acute osseous findings. IMPRESSION: No active cardiopulmonary disease. Electronically Signed   By: JJorje GuildM.D.   On: 04/02/2022 12:17    Assessment & Plan:   RJoaquimwas seen today for hypertension.  Diagnoses and all orders for this visit:  Chronic right shoulder pain -     Ambulatory referral to Orthopedic Surgery  Essential hypertension- His blood pressure is adequately well controlled.   I am having RCristopher Estimable GSontagmaintain his aspirin EC, multivitamin with minerals, sildenafil, amLODipine, furosemide, and rosuvastatin.  No orders of the defined types were placed  in this encounter.    Follow-up: No follow-ups on file.  Scarlette Calico, MD

## 2022-06-30 DIAGNOSIS — Z85828 Personal history of other malignant neoplasm of skin: Secondary | ICD-10-CM | POA: Diagnosis not present

## 2022-06-30 DIAGNOSIS — L570 Actinic keratosis: Secondary | ICD-10-CM | POA: Diagnosis not present

## 2022-06-30 DIAGNOSIS — L821 Other seborrheic keratosis: Secondary | ICD-10-CM | POA: Diagnosis not present

## 2022-07-13 DIAGNOSIS — H6122 Impacted cerumen, left ear: Secondary | ICD-10-CM | POA: Diagnosis not present

## 2022-07-29 DIAGNOSIS — M25562 Pain in left knee: Secondary | ICD-10-CM | POA: Diagnosis not present

## 2022-11-26 ENCOUNTER — Other Ambulatory Visit: Payer: Self-pay | Admitting: Internal Medicine

## 2022-11-26 DIAGNOSIS — E785 Hyperlipidemia, unspecified: Secondary | ICD-10-CM

## 2022-11-26 DIAGNOSIS — I10 Essential (primary) hypertension: Secondary | ICD-10-CM

## 2022-12-02 ENCOUNTER — Telehealth: Payer: Self-pay | Admitting: Internal Medicine

## 2022-12-02 NOTE — Telephone Encounter (Signed)
Patient requested refill of amLODipine (NORVASC) 10 MG tablet.

## 2022-12-02 NOTE — Telephone Encounter (Signed)
What med was pt needing refill??../l,mb

## 2022-12-02 NOTE — Telephone Encounter (Signed)
Per last visit pt was due back in 6 months. MD denied refills on 3 meds. Pls schedule appt...Raechel Chute

## 2022-12-02 NOTE — Telephone Encounter (Signed)
Patient called requesting a medication refill. He has not been seen since 04/20/2022. When informed he would need to make an appointment for a refill, he refused, stating that he never comes that often and does not want to come in before his 12 month for his physical. Patient was advised that the medication would not be able to be refilled until he has an appointment, but he declined and asked to have a message back anyway. Best callback is [phone].

## 2022-12-30 DIAGNOSIS — Z85828 Personal history of other malignant neoplasm of skin: Secondary | ICD-10-CM | POA: Diagnosis not present

## 2022-12-30 DIAGNOSIS — L570 Actinic keratosis: Secondary | ICD-10-CM | POA: Diagnosis not present

## 2023-02-03 ENCOUNTER — Encounter: Payer: Self-pay | Admitting: Internal Medicine

## 2023-02-03 ENCOUNTER — Telehealth: Payer: Self-pay | Admitting: Internal Medicine

## 2023-02-03 NOTE — Telephone Encounter (Signed)
Pt called in to make appt advise pt of Dr. Yetta Barre next availability he was trying to see someone else because he said he couldn't wait that long to get his meds refilled advise pt he have to see his primary Dr. Rock Nephew said scheduled him tried to ask him what meds he need refilling pt hung up.

## 2023-02-04 NOTE — Telephone Encounter (Signed)
Pt is scheduled and we do not have no sooner appt available. Pt call back asking if he can get a temporary refill. Please advise   amLODipine (NORVASC) 10 MG tablet  furosemide (LASIX) 20 MG tablet  rosuvastatin (CRESTOR) 10 MG tablet

## 2023-02-04 NOTE — Telephone Encounter (Signed)
Error

## 2023-02-05 NOTE — Telephone Encounter (Signed)
Left voicemail for patient to return my call for earlier appointment.,

## 2023-02-08 ENCOUNTER — Ambulatory Visit (INDEPENDENT_AMBULATORY_CARE_PROVIDER_SITE_OTHER): Payer: HMO | Admitting: Internal Medicine

## 2023-02-08 ENCOUNTER — Encounter: Payer: Self-pay | Admitting: Internal Medicine

## 2023-02-08 VITALS — BP 116/58 | HR 71 | Temp 97.9°F | Ht 72.0 in | Wt 187.0 lb

## 2023-02-08 DIAGNOSIS — E785 Hyperlipidemia, unspecified: Secondary | ICD-10-CM | POA: Diagnosis not present

## 2023-02-08 DIAGNOSIS — R739 Hyperglycemia, unspecified: Secondary | ICD-10-CM | POA: Diagnosis not present

## 2023-02-08 DIAGNOSIS — I10 Essential (primary) hypertension: Secondary | ICD-10-CM | POA: Diagnosis not present

## 2023-02-08 NOTE — Patient Instructions (Signed)
Hypertension, Adult High blood pressure (hypertension) is when the force of blood pumping through the arteries is too strong. The arteries are the blood vessels that carry blood from the heart throughout the body. Hypertension forces the heart to work harder to pump blood and may cause arteries to become narrow or stiff. Untreated or uncontrolled hypertension can lead to a heart attack, heart failure, a stroke, kidney disease, and other problems. A blood pressure reading consists of a higher number over a lower number. Ideally, your blood pressure should be below 120/80. The first ("top") number is called the systolic pressure. It is a measure of the pressure in your arteries as your heart beats. The second ("bottom") number is called the diastolic pressure. It is a measure of the pressure in your arteries as the heart relaxes. What are the causes? The exact cause of this condition is not known. There are some conditions that result in high blood pressure. What increases the risk? Certain factors may make you more likely to develop high blood pressure. Some of these risk factors are under your control, including: Smoking. Not getting enough exercise or physical activity. Being overweight. Having too much fat, sugar, calories, or salt (sodium) in your diet. Drinking too much alcohol. Other risk factors include: Having a personal history of heart disease, diabetes, high cholesterol, or kidney disease. Stress. Having a family history of high blood pressure and high cholesterol. Having obstructive sleep apnea. Age. The risk increases with age. What are the signs or symptoms? High blood pressure may not cause symptoms. Very high blood pressure (hypertensive crisis) may cause: Headache. Fast or irregular heartbeats (palpitations). Shortness of breath. Nosebleed. Nausea and vomiting. Vision changes. Severe chest pain, dizziness, and seizures. How is this diagnosed? This condition is diagnosed by  measuring your blood pressure while you are seated, with your arm resting on a flat surface, your legs uncrossed, and your feet flat on the floor. The cuff of the blood pressure monitor will be placed directly against the skin of your upper arm at the level of your heart. Blood pressure should be measured at least twice using the same arm. Certain conditions can cause a difference in blood pressure between your right and left arms. If you have a high blood pressure reading during one visit or you have normal blood pressure with other risk factors, you may be asked to: Return on a different day to have your blood pressure checked again. Monitor your blood pressure at home for 1 week or longer. If you are diagnosed with hypertension, you may have other blood or imaging tests to help your health care provider understand your overall risk for other conditions. How is this treated? This condition is treated by making healthy lifestyle changes, such as eating healthy foods, exercising more, and reducing your alcohol intake. You may be referred for counseling on a healthy diet and physical activity. Your health care provider may prescribe medicine if lifestyle changes are not enough to get your blood pressure under control and if: Your systolic blood pressure is above 130. Your diastolic blood pressure is above 80. Your personal target blood pressure may vary depending on your medical conditions, your age, and other factors. Follow these instructions at home: Eating and drinking  Eat a diet that is high in fiber and potassium, and low in sodium, added sugar, and fat. An example of this eating plan is called the DASH diet. DASH stands for Dietary Approaches to Stop Hypertension. To eat this way: Eat   plenty of fresh fruits and vegetables. Try to fill one half of your plate at each meal with fruits and vegetables. Eat whole grains, such as whole-wheat pasta, brown rice, or whole-grain bread. Fill about one  fourth of your plate with whole grains. Eat or drink low-fat dairy products, such as skim milk or low-fat yogurt. Avoid fatty cuts of meat, processed or cured meats, and poultry with skin. Fill about one fourth of your plate with lean proteins, such as fish, chicken without skin, beans, eggs, or tofu. Avoid pre-made and processed foods. These tend to be higher in sodium, added sugar, and fat. Reduce your daily sodium intake. Many people with hypertension should eat less than 1,500 mg of sodium a day. Do not drink alcohol if: Your health care provider tells you not to drink. You are pregnant, may be pregnant, or are planning to become pregnant. If you drink alcohol: Limit how much you have to: 0-1 drink a day for women. 0-2 drinks a day for men. Know how much alcohol is in your drink. In the [place] one drink equals one 12 oz bottle of beer (355 mL), one 5 oz glass of wine (148 mL), or one 1 oz glass of hard liquor (44 mL). Lifestyle  Work with your health care provider to maintain a healthy body weight or to lose weight. Ask what an ideal weight is for you. Get at least 30 minutes of exercise that causes your heart to beat faster (aerobic exercise) most days of the week. Activities may include walking, swimming, or biking. Include exercise to strengthen your muscles (resistance exercise), such as Pilates or lifting weights, as part of your weekly exercise routine. Try to do these types of exercises for 30 minutes at least 3 days a week. Do not use any products that contain nicotine or tobacco. These products include cigarettes, chewing tobacco, and vaping devices, such as e-cigarettes. If you need help quitting, ask your health care provider. Monitor your blood pressure at home as told by your health care provider. Keep all follow-up visits. This is important. Medicines Take over-the-counter and prescription medicines only as told by your health care provider. Follow directions carefully. Blood  pressure medicines must be taken as prescribed. Do not skip doses of blood pressure medicine. Doing this puts you at risk for problems and can make the medicine less effective. Ask your health care provider about side effects or reactions to medicines that you should watch for. Contact a health care provider if you: Think you are having a reaction to a medicine you are taking. Have headaches that keep coming back (recurring). Feel dizzy. Have swelling in your ankles. Have trouble with your vision. Get help right away if you: Develop a severe headache or confusion. Have unusual weakness or numbness. Feel faint. Have severe pain in your chest or abdomen. Vomit repeatedly. Have trouble breathing. These symptoms may be an emergency. Get help right away. Call 911. Do not wait to see if the symptoms will go away. Do not drive yourself to the hospital. Summary Hypertension is when the force of blood pumping through your arteries is too strong. If this condition is not controlled, it may put you at risk for serious complications. Your personal target blood pressure may vary depending on your medical conditions, your age, and other factors. For most people, a normal blood pressure is less than 120/80. Hypertension is treated with lifestyle changes, medicines, or a combination of both. Lifestyle changes include losing weight, eating a healthy,   low-sodium diet, exercising more, and limiting alcohol. This information is not intended to replace advice given to you by your health care provider. Make sure you discuss any questions you have with your health care provider. Document Revised: 05/20/2021 Document Reviewed: 05/20/2021 Elsevier Patient Education  2024 [employer].  

## 2023-02-08 NOTE — Progress Notes (Unsigned)
Subjective:  Patient ID: Cory Vasquez, male    DOB: Sep 10, 1945  Age: 77 y.o. MRN: [id number]  CC: Hypertension and Hyperlipidemia   HPI Cory Vasquez presents for f/up ---  Discussed the use of AI scribe software for clinical note transcription with the patient, who gave verbal consent to proceed.  History of Present Illness   The patient, with a history of hypertension and hyperlipidemia, presents for medication refill. They report feeling well with no complaints of chest pain, shortness of breath, or dizziness. They deny any side effects from their current medications, which include amlodipine, furosemide, and rosuvastatin.  Interestingly, the patient has experienced significant weight loss, approximately 13 pounds over the past year, with 10 pounds lost recently. They attribute this weight loss to increased physical activity, including walking up to two miles twice daily, and cessation of alcohol consumption for the past 45 days.  Despite not frequently monitoring their blood pressure at home, the patient notes that it typically measures around 130 systolic when checked. They express no concerns about their health status and feel in good shape.       Outpatient Medications Prior to Visit  Medication Sig Dispense Refill   aspirin 81 MG EC tablet Take 81 mg by mouth daily.     Multiple Vitamin (MULTIVITAMIN WITH MINERALS) TABS tablet Take 1 tablet by mouth daily.     sildenafil (REVATIO) 20 MG tablet Take 20 mg by mouth as needed.     amLODipine (NORVASC) 10 MG tablet Take 1 tablet (10 mg total) by mouth daily. 90 tablet 1   furosemide (LASIX) 20 MG tablet Take 1 tablet (20 mg total) by mouth daily. 90 tablet 1   rosuvastatin (CRESTOR) 10 MG tablet Take 1 tablet (10 mg total) by mouth daily. 90 tablet 1   No facility-administered medications prior to visit.    ROS Review of Systems  Objective:  BP (!) 116/58 (BP Location: Right Arm, Patient Position: Sitting, Cuff Size:  Normal)   Pulse 71   Temp 97.9 F (36.6 C) (Oral)   Ht 6' (1.829 m)   Wt 187 lb (84.8 kg)   SpO2 97%   BMI 25.36 kg/m   BP Readings from Last 3 Encounters:  02/08/23 (!) 116/58  04/20/22 122/68  04/07/22 136/76    Wt Readings from Last 3 Encounters:  02/08/23 187 lb (84.8 kg)  04/20/22 196 lb (88.9 kg)  04/07/22 195 lb (88.5 kg)    Physical Exam  Lab Results  Component Value Date   WBC 5.0 02/08/2023   HGB 15.0 02/08/2023   HCT 44.5 02/08/2023   PLT 247.0 02/08/2023   GLUCOSE 81 02/08/2023   CHOL 141 04/07/2022   TRIG 105.0 04/07/2022   HDL 62.50 04/07/2022   LDLCALC 57 04/07/2022   ALT 17 02/08/2023   AST 23 02/08/2023   NA 138 02/08/2023   K 4.7 02/08/2023   CL 106 02/08/2023   CREATININE 1.01 02/08/2023   BUN 17 02/08/2023   CO2 28 02/08/2023   TSH 1.08 02/08/2023   PSA 1.00 04/10/2021   INR 0.9 05/31/2007   HGBA1C 6.0 02/08/2023    CT Angio Chest PE W and/or Wo Contrast  Result Date: 04/02/2022 CLINICAL DATA:  Pulmonary embolism (PE) suspected, high prob right sided pleuritic chest pain x 2 days EXAM: CT ANGIOGRAPHY CHEST WITH CONTRAST TECHNIQUE: Multidetector CT imaging of the chest was performed using the standard protocol during bolus administration of intravenous contrast. Multiplanar CT image reconstructions and  MIPs were obtained to evaluate the vascular anatomy. RADIATION DOSE REDUCTION: This exam was performed according to the departmental dose-optimization program which includes automated exposure control, adjustment of the mA and/or kV according to patient size and/or use of iterative reconstruction technique. CONTRAST:  75mL OMNIPAQUE IOHEXOL 350 MG/ML SOLN COMPARISON:  Chest x-ray from the same day. FINDINGS: Cardiovascular: Satisfactory opacification of the pulmonary arteries to the segmental level. No evidence of pulmonary embolism. Normal heart size. No pericardial effusion. Calcific atherosclerosis of the aorta. Mediastinum/Nodes: No enlarged  mediastinal, hilar, or axillary lymph nodes. Thyroid gland, trachea, and esophagus demonstrate no significant findings. Lungs/Pleura: Approximately 4 mm pulmonary nodule in the left lower lobe. (Series 6, image 107). Mild dependent ground-glass opacities, probably atelectasis. No consolidation. No pleural effusions or pneumothorax. Upper Abdomen: No acute abnormality. Musculoskeletal: No evidence of acute fracture multilevel degenerative change in the thoracic spine including bridging osteophytes. Review of the MIP images confirms the above findings. IMPRESSION: 1. No evidence of acute pulmonary embolism. 2. Approximately 4 mm pulmonary nodule in the left lower lobe. No follow-up needed if patient is low-risk.This recommendation follows the consensus statement: Guidelines for Management of Incidental Pulmonary Nodules Detected on CT Images: From the Fleischner Society 2017; Radiology 2017; 284:228-243. 3. Aortic Atherosclerosis (ICD10-I70.0). Electronically Signed   By: Feliberto Harts M.D.   On: 04/02/2022 13:21   DG Chest 2 View  Result Date: 04/02/2022 CLINICAL DATA:  Chest pain EXAM: CHEST - 2 VIEW COMPARISON:  03/28/2020 FINDINGS: Artifact from EKG pads. Normal heart size and mediastinal contours. No acute infiltrate or edema. No effusion or pneumothorax. Lower thoracic endplate spurring. No acute osseous findings. IMPRESSION: No active cardiopulmonary disease. Electronically Signed   By: Tiburcio Pea M.D.   On: 04/02/2022 12:17    Assessment & Plan:  Chronic hyperglycemia -     Basic metabolic panel; Future -     Hemoglobin A1c; Future -     Cortisol; Future  Dyslipidemia, goal LDL below 130 -     TSH; Future -     Hepatic function panel; Future -     Rosuvastatin Calcium; Take 1 tablet (10 mg total) by mouth daily.  Dispense: 90 tablet; Refill: 1  Essential hypertension -     Basic metabolic panel; Future -     CBC with Differential/Platelet; Future -     TSH; Future -     Hepatic  function panel; Future -     Cortisol; Future     Follow-up: Return in about 6 months (around 08/11/2023).  Sanda Linger, MD

## 2023-02-09 ENCOUNTER — Telehealth: Payer: Self-pay | Admitting: Internal Medicine

## 2023-02-09 LAB — CBC WITH DIFFERENTIAL/PLATELET
Basophils Absolute: 0 10*3/uL (ref 0.0–0.1)
Basophils Relative: 0.3 % (ref 0.0–3.0)
Eosinophils Absolute: 0.2 10*3/uL (ref 0.0–0.7)
Eosinophils Relative: 4.5 % (ref 0.0–5.0)
HCT: 44.5 % (ref 39.0–52.0)
Hemoglobin: 15 g/dL (ref 13.0–17.0)
Lymphocytes Relative: 39.3 % (ref 12.0–46.0)
Lymphs Abs: 2 10*3/uL (ref 0.7–4.0)
MCHC: 33.7 g/dL (ref 30.0–36.0)
MCV: 94.3 fl (ref 78.0–100.0)
Monocytes Absolute: 0.5 10*3/uL (ref 0.1–1.0)
Monocytes Relative: 10.7 % (ref 3.0–12.0)
Neutro Abs: 2.3 10*3/uL (ref 1.4–7.7)
Neutrophils Relative %: 45.2 % (ref 43.0–77.0)
Platelets: 247 10*3/uL (ref 150.0–400.0)
RBC: 4.72 Mil/uL (ref 4.22–5.81)
RDW: 12.3 % (ref 11.5–15.5)
WBC: 5 10*3/uL (ref 4.0–10.5)

## 2023-02-09 LAB — BASIC METABOLIC PANEL
BUN: 17 mg/dL (ref 6–23)
CO2: 28 mEq/L (ref 19–32)
Calcium: 9.8 mg/dL (ref 8.4–10.5)
Chloride: 106 mEq/L (ref 96–112)
Creatinine, Ser: 1.01 mg/dL (ref 0.40–1.50)
GFR: 72.54 mL/min (ref >60.00)
Glucose, Bld: 81 mg/dL (ref 70–99)
Potassium: 4.7 mEq/L (ref 3.5–5.1)
Sodium: 138 mEq/L (ref 135–145)

## 2023-02-09 LAB — HEPATIC FUNCTION PANEL
ALT: 17 U/L (ref 0–53)
AST: 23 U/L (ref 0–37)
Albumin: 4.2 g/dL (ref 3.5–5.2)
Alkaline Phosphatase: 77 U/L (ref 39–117)
Bilirubin, Direct: 0.2 mg/dL (ref 0.0–0.3)
Total Bilirubin: 0.5 mg/dL (ref 0.2–1.2)
Total Protein: 7.2 g/dL (ref 6.0–8.3)

## 2023-02-09 LAB — TSH: TSH: 1.08 u[IU]/mL (ref 0.35–5.50)

## 2023-02-09 LAB — CORTISOL: Cortisol, Plasma: 6.8 ug/dL

## 2023-02-09 LAB — HEMOGLOBIN A1C: Hgb A1c MFr Bld: 6 % (ref 4.6–6.5)

## 2023-02-09 NOTE — Telephone Encounter (Signed)
Patient called and said Dr. Yetta Barre took him off of two of his medications. He said he needs a refill of the third medication that he is supposed to continue taking. Patient does not know the name of the medication and refused to call back with the name. He then hung up when informed that we need the name of the medication he would like refilled.

## 2023-02-11 MED ORDER — ROSUVASTATIN CALCIUM 10 MG PO TABS: 10.0000 mg | ORAL_TABLET | Freq: Every day | ORAL | 1 refills | Status: DC

## 2023-02-16 ENCOUNTER — Other Ambulatory Visit: Payer: Self-pay | Admitting: Internal Medicine

## 2023-02-16 ENCOUNTER — Encounter: Payer: Self-pay | Admitting: Internal Medicine

## 2023-02-16 DIAGNOSIS — I10 Essential (primary) hypertension: Secondary | ICD-10-CM

## 2023-02-16 MED ORDER — OLMESARTAN MEDOXOMIL 20 MG PO TABS: 20.0000 mg | ORAL_TABLET | Freq: Every day | ORAL | 1 refills | Status: DC

## 2023-02-23 DIAGNOSIS — H6122 Impacted cerumen, left ear: Secondary | ICD-10-CM | POA: Diagnosis not present

## 2023-03-02 ENCOUNTER — Ambulatory Visit: Payer: Medicare Other | Admitting: Internal Medicine

## 2023-03-11 ENCOUNTER — Encounter (INDEPENDENT_AMBULATORY_CARE_PROVIDER_SITE_OTHER): Payer: Self-pay

## 2023-03-30 DIAGNOSIS — H52223 Regular astigmatism, bilateral: Secondary | ICD-10-CM | POA: Diagnosis not present

## 2023-03-30 DIAGNOSIS — H524 Presbyopia: Secondary | ICD-10-CM | POA: Diagnosis not present

## 2023-03-30 DIAGNOSIS — H2513 Age-related nuclear cataract, bilateral: Secondary | ICD-10-CM | POA: Diagnosis not present

## 2023-03-30 DIAGNOSIS — H5203 Hypermetropia, bilateral: Secondary | ICD-10-CM | POA: Diagnosis not present

## 2023-03-30 DIAGNOSIS — H53413 Scotoma involving central area, bilateral: Secondary | ICD-10-CM | POA: Diagnosis not present

## 2023-04-12 DIAGNOSIS — C61 Malignant neoplasm of prostate: Secondary | ICD-10-CM | POA: Diagnosis not present

## 2023-04-12 LAB — PSA: PSA: 0.9

## 2023-04-13 NOTE — Progress Notes (Signed)
Cory Farmer, MD Reason for referral-syncope  HPI: 77 year old male for evaluation of syncope at request of Sanda Linger, MD.  Previously seen but not since February 2021. He did have nonsustained ventricular tachycardia on a prior exercise treadmill but his LV function is normal. A previous Myoview in November 2005 showed an ejection fraction of 58% with no ischemia or infarction. Echo 8/16 showed normal LV function, grade 1 diastolic dysfunction. Event monitor 8/16 negative. ETT 10/16 negative. 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 He converted spontaneously to sinus rhythm.  Calcium score October 2022 65 which was 29th percentile.  Patient had a recent syncopal episode and cardiology now asked to evaluate.  Patient denies dyspnea on exertion, orthopnea, PND, pedal edema, exertional chest pain.  He has not had an episode of SVT by his report since 2017.  He was recently in [place].  He had not eaten on the morning of his event.  He went in to a restaurant to order a sandwich and came back out.  As he was waiting for the bus he became hot.  As he was walking to the shade he had frank syncope.  No preceding chest pain, palpitations, dyspnea, nausea or diaphoresis.  He felt as though he was unconscious for approximately 20 seconds.  EMS was called but he declined to go to the hospital.  His vitals by his report were stable.  Note he has had an occasional episode of syncope in the past.  1 occurred while he was given a speech under stress.  Current Outpatient Medications  Medication Sig Dispense Refill   aspirin 81 MG EC tablet Take 81 mg by mouth daily.     Multiple Vitamin (MULTIVITAMIN WITH MINERALS) TABS tablet Take 1 tablet by mouth daily.     olmesartan (BENICAR) 20 MG tablet Take 1 tablet (20 mg total) by mouth daily. 90 tablet 1   rosuvastatin (CRESTOR) 10 MG tablet  Take 1 tablet (10 mg total) by mouth daily. 90 tablet 1   sildenafil (REVATIO) 20 MG tablet Take 20 mg by mouth as needed.     No current facility-administered medications for this visit.    No Known Allergies   Past Medical History:  Diagnosis Date   Diverticulosis    see colonoscopy 5/06 rec fu 11/2014   HBP (high blood pressure)    ACE d/c'd 11/02/08. cough/sorethroat > resolved   Health maintenance examination    [name]. Td June 30/2011. CPX June 30/2011   Hyperlipidemia    Palpitations    neg myoview 11/05 with EF 58%   Prostate cancer (HCC)    SVT (supraventricular tachycardia)     Past Surgical History:  Procedure Laterality Date   BACK SURGERY     [name] 11/08    Social History   Socioeconomic History   Marital status: Married    Spouse name: Not on file   Number of children: 2   Years of education: Not on file   Highest education level: Not on file  Occupational History   Occupation: Salesman  Tobacco Use   Smoking status: Never   Smokeless tobacco: Never  Substance and Sexual Activity   Alcohol use: Yes    Alcohol/week: 5.0 - 6.0 standard drinks of alcohol    Types: 5 - 6 Standard drinks or equivalent per week    Comment: Occasional   Drug use: No  Sexual activity: Not on file  Other Topics Concern   Not on file  Social History Narrative   Denies flu shot 09/29/2010         Social Determinants of Health   Financial Resource Strain: Low Risk  (04/10/2021)   Overall Financial Resource Strain (CARDIA)    Difficulty of Paying Living Expenses: Not hard at all  Food Insecurity: No Food Insecurity (04/10/2021)   Hunger Vital Sign    Worried About Running Out of Food in the Last Year: Never true    Ran Out of Food in the Last Year: Never true  Transportation Needs: No Transportation Needs (04/10/2021)   PRAPARE - Administrator, Civil Service (Medical): No    Lack of Transportation (Non-Medical): No  Physical Activity: Sufficiently Active  (04/10/2021)   Exercise Vital Sign    Days of Exercise per Week: 5 days    Minutes of Exercise per Session: 30 min  Stress: No Stress Concern Present (04/10/2021)   [employer] of Occupational Health - Occupational Stress Questionnaire    Feeling of Stress : Not at all  Social Connections: Not on file  Intimate Partner Violence: Not At Risk (04/10/2021)   Humiliation, Afraid, Rape, and Kick questionnaire    Fear of Current or Ex-Partner: No    Emotionally Abused: No    Physically Abused: No    Sexually Abused: No    Family History  Problem Relation Age of Onset   Heart disease Father 72   Diabetes Brother     ROS: no fevers or chills, productive cough, hemoptysis, dysphasia, odynophagia, melena, hematochezia, dysuria, hematuria, rash, seizure activity, orthopnea, PND, pedal edema, claudication. Remaining systems are negative.  Physical Exam:   Blood pressure (!) 162/78, pulse 65, height 6' (1.829 m), weight 189 lb 12.8 oz (86.1 kg), SpO2 99%.  General:  Well developed/well nourished in NAD Skin warm/dry Patient not depressed No peripheral clubbing Back-normal HEENT-normal/normal eyelids Neck supple/normal carotid upstroke bilaterally; no bruits; no JVD; no thyromegaly chest - CTA/ normal expansion CV - RRR/normal S1 and S2; no murmurs, rubs or gallops;  PMI nondisplaced Abdomen -NT/ND, no HSM, no mass, + bowel sounds, no bruit 2+ femoral pulses, no bruits Ext-no edema, chords, 2+ DP Neuro-grossly nonfocal  ECG - 04/15/23 NSR, no ST changes. personally reviewed  A/P  1 syncope-etiology unclear.  However her event sounds potentially to be vagally mediated with possible superimposed dehydration.  He has had no recurrences since his most recent episode and has had rare instances in the past that sound potentially like vagal syncope.  We will repeat echocardiogram.  If LV function normal we will follow expectantly.  If he has an additional episode may need implantable loop  monitor.  I have asked him not to drive for 6 months following his recent event.  2 hypertension-blood pressure is elevated.  However I have asked him to track this at home and we will advance regimen if follow-up readings are high.  3 hyperlipidemia-continue statin.  4 coronary calcification-continue aspirin and statin.  5 history of SVT-patient has not had an episode since 2017.  Can consider referral for ablation in the future if he has recurrences.  Olga Millers, MD

## 2023-04-15 ENCOUNTER — Encounter: Payer: Self-pay | Admitting: Internal Medicine

## 2023-04-15 ENCOUNTER — Ambulatory Visit (INDEPENDENT_AMBULATORY_CARE_PROVIDER_SITE_OTHER): Payer: HMO | Admitting: Internal Medicine

## 2023-04-15 VITALS — BP 142/78 | HR 60 | Temp 97.9°F | Resp 16 | Ht 72.0 in | Wt 188.6 lb

## 2023-04-15 DIAGNOSIS — Z0000 Encounter for general adult medical examination without abnormal findings: Secondary | ICD-10-CM | POA: Diagnosis not present

## 2023-04-15 DIAGNOSIS — Z23 Encounter for immunization: Secondary | ICD-10-CM | POA: Diagnosis not present

## 2023-04-15 DIAGNOSIS — E785 Hyperlipidemia, unspecified: Secondary | ICD-10-CM | POA: Diagnosis not present

## 2023-04-15 DIAGNOSIS — I10 Essential (primary) hypertension: Secondary | ICD-10-CM

## 2023-04-15 DIAGNOSIS — Z0001 Encounter for general adult medical examination with abnormal findings: Secondary | ICD-10-CM | POA: Insufficient documentation

## 2023-04-15 LAB — LIPID PANEL
Cholesterol: 132 mg/dL (ref 0–200)
HDL: 54.9 mg/dL (ref >39.00)
LDL Cholesterol: 43 mg/dL (ref 0–99)
NonHDL: 76.99
Total CHOL/HDL Ratio: 2
Triglycerides: 172 mg/dL — ABNORMAL HIGH (ref 0.0–149.0)
VLDL: 34.4 mg/dL (ref 0.0–40.0)

## 2023-04-15 MED ORDER — BOOSTRIX 5-2.5-18.5 LF-MCG/0.5 IM SUSP: 0.5000 mL | Freq: Once | INTRAMUSCULAR | 0 refills | Status: AC

## 2023-04-15 MED ORDER — SHINGRIX 50 MCG/0.5ML IM SUSR: 0.5000 mL | Freq: Once | INTRAMUSCULAR | 1 refills | Status: AC

## 2023-04-15 NOTE — Patient Instructions (Signed)
Health Maintenance, Male Adopting a healthy lifestyle and getting preventive care are important in promoting health and wellness. Ask your health care provider about: The right schedule for you to have regular tests and exams. Things you can do on your own to prevent diseases and keep yourself healthy. What should I know about diet, weight, and exercise? Eat a healthy diet  Eat a diet that includes plenty of vegetables, fruits, low-fat dairy products, and lean protein. Do not eat a lot of foods that are high in solid fats, added sugars, or sodium. Maintain a healthy weight Body mass index (BMI) is a measurement that can be used to identify possible weight problems. It estimates body fat based on height and weight. Your health care provider can help determine your BMI and help you achieve or maintain a healthy weight. Get regular exercise Get regular exercise. This is one of the most important things you can do for your health. Most adults should: Exercise for at least 150 minutes each week. The exercise should increase your heart rate and make you sweat (moderate-intensity exercise). Do strengthening exercises at least twice a week. This is in addition to the moderate-intensity exercise. Spend less time sitting. Even light physical activity can be beneficial. Watch cholesterol and blood lipids Have your blood tested for lipids and cholesterol at 76 years of age, then have this test every 5 years. You may need to have your cholesterol levels checked more often if: Your lipid or cholesterol levels are high. You are older than 76 years of age. You are at high risk for heart disease. What should I know about cancer screening? Many types of cancers can be detected early and may often be prevented. Depending on your health history and family history, you may need to have cancer screening at various ages. This may include screening for: Colorectal cancer. Prostate cancer. Skin cancer. Lung  cancer. What should I know about heart disease, diabetes, and high blood pressure? Blood pressure and heart disease High blood pressure causes heart disease and increases the risk of stroke. This is more likely to develop in people who have high blood pressure readings or are overweight. Talk with your health care provider about your target blood pressure readings. Have your blood pressure checked: Every 3-5 years if you are 47-65 years of age. Every year if you are 53 years old or older. If you are between the ages of 58 and 62 and are a current or former smoker, ask your health care provider if you should have a one-time screening for abdominal aortic aneurysm (AAA). Diabetes Have regular diabetes screenings. This checks your fasting blood sugar level. Have the screening done: Once every three years after age 65 if you are at a normal weight and have a low risk for diabetes. More often and at a younger age if you are overweight or have a high risk for diabetes. What should I know about preventing infection? Hepatitis B If you have a higher risk for hepatitis B, you should be screened for this virus. Talk with your health care provider to find out if you are at risk for hepatitis B infection. Hepatitis C Blood testing is recommended for: Everyone born from [birth year] through 1964. Anyone with known risk factors for hepatitis C. Sexually transmitted infections (STIs) You should be screened each year for STIs, including gonorrhea and chlamydia, if: You are sexually active and are younger than 76 years of age. You are older than 76 years of age and your  health care provider tells you that you are at risk for this type of infection. Your sexual activity has changed since you were last screened, and you are at increased risk for chlamydia or gonorrhea. Ask your health care provider if you are at risk. Ask your health care provider about whether you are at high risk for HIV. Your health care provider  may recommend a prescription medicine to help prevent HIV infection. If you choose to take medicine to prevent HIV, you should first get tested for HIV. You should then be tested every 3 months for as long as you are taking the medicine. Follow these instructions at home: Alcohol use Do not drink alcohol if your health care provider tells you not to drink. If you drink alcohol: Limit how much you have to 0-2 drinks a day. Know how much alcohol is in your drink. In the [place] one drink equals one 12 oz bottle of beer (355 mL), one 5 oz glass of wine (148 mL), or one 1 oz glass of hard liquor (44 mL). Lifestyle Do not use any products that contain nicotine or tobacco. These products include cigarettes, chewing tobacco, and vaping devices, such as e-cigarettes. If you need help quitting, ask your health care provider. Do not use street drugs. Do not share needles. Ask your health care provider for help if you need support or information about quitting drugs. General instructions Schedule regular health, dental, and eye exams. Stay current with your vaccines. Tell your health care provider if: You often feel depressed. You have ever been abused or do not feel safe at home. Summary Adopting a healthy lifestyle and getting preventive care are important in promoting health and wellness. Follow your health care provider's instructions about healthy diet, exercising, and getting tested or screened for diseases. Follow your health care provider's instructions on monitoring your cholesterol and blood pressure. This information is not intended to replace advice given to you by your health care provider. Make sure you discuss any questions you have with your health care provider. Document Revised: 12/02/2020 Document Reviewed: 12/02/2020 Elsevier Patient Education  2024 [employer].

## 2023-04-15 NOTE — Progress Notes (Signed)
Subjective:  Patient ID: Cory Vasquez, male    DOB: [birth year]  Age: 77 y.o. MRN: [id number]  CC: Annual Exam, Hypertension, and Hyperlipidemia   HPI Cory Vasquez presents for a CPX and f/up ----   Discussed the use of AI scribe software for clinical note transcription with the patient, who gave verbal consent to proceed.  History of Present Illness   The patient denies any chest pain, shortness of breath, or dizziness. They are currently on a regimen of medications, the details of which were not discussed in the conversation. The patient is a nonsmoker and reports moderate alcohol consumption. They have also recently lost weight, which they believe has positively impacted their blood pressure.       Outpatient Medications Prior to Visit  Medication Sig Dispense Refill   aspirin 81 MG EC tablet Take 81 mg by mouth daily.     Multiple Vitamin (MULTIVITAMIN WITH MINERALS) TABS tablet Take 1 tablet by mouth daily.     sildenafil (REVATIO) 20 MG tablet Take 20 mg by mouth as needed.     olmesartan (BENICAR) 20 MG tablet Take 1 tablet (20 mg total) by mouth daily. 90 tablet 1   rosuvastatin (CRESTOR) 10 MG tablet Take 1 tablet (10 mg total) by mouth daily. 90 tablet 1   No facility-administered medications prior to visit.    ROS Review of Systems  Constitutional: Negative.  Negative for diaphoresis and fatigue.  HENT: Negative.    Respiratory: Negative.  Negative for apnea, cough, shortness of breath and wheezing.   Cardiovascular:  Negative for chest pain, palpitations and leg swelling.  Gastrointestinal:  Negative for abdominal pain, diarrhea, nausea and vomiting.  Genitourinary:  Negative for difficulty urinating, dysuria and hematuria.  Musculoskeletal: Negative.  Negative for arthralgias and myalgias.  Skin: Negative.   Neurological: Negative.  Negative for dizziness and weakness.  Hematological:  Negative for adenopathy. Does not bruise/bleed easily.   Psychiatric/Behavioral: Negative.      Objective:  BP (!) 142/78 (BP Location: Left Arm, Patient Position: Sitting, Cuff Size: Normal)   Pulse 60   Temp 97.9 F (36.6 C) (Oral)   Resp 16   Ht 6' (1.829 m)   Wt 188 lb 9.6 oz (85.5 kg)   SpO2 96%   BMI 25.58 kg/m   BP Readings from Last 3 Encounters:  04/15/23 (!) 142/78  02/08/23 (!) 116/58  04/20/22 122/68    Wt Readings from Last 3 Encounters:  04/15/23 188 lb 9.6 oz (85.5 kg)  02/08/23 187 lb (84.8 kg)  04/20/22 196 lb (88.9 kg)    Physical Exam Vitals reviewed.  Constitutional:      Appearance: Normal appearance.  HENT:     Mouth/Throat:     Mouth: Mucous membranes are moist.  Eyes:     General: No scleral icterus.    Conjunctiva/sclera: Conjunctivae normal.  Cardiovascular:     Rate and Rhythm: Normal rate and regular rhythm.     Heart sounds: Normal heart sounds. No murmur heard.    No friction rub. No gallop.     Comments: EKG- NSR, 61 bpm Anterior/inferior infarct patterns are old No LVH Unchanged Pulmonary:     Effort: Pulmonary effort is normal.     Breath sounds: No stridor. No wheezing, rhonchi or rales.  Abdominal:     Palpations: There is no mass.     Tenderness: There is no abdominal tenderness. There is no guarding.     Hernia: No hernia is  present.  Musculoskeletal:     Cervical back: Neck supple.     Right lower leg: No edema.     Left lower leg: No edema.  Lymphadenopathy:     Cervical: No cervical adenopathy.  Skin:    General: Skin is warm and dry.  Neurological:     General: No focal deficit present.     Mental Status: He is alert. Mental status is at baseline.  Psychiatric:        Mood and Affect: Mood normal.        Behavior: Behavior normal.     Lab Results  Component Value Date   WBC 5.0 02/08/2023   HGB 15.0 02/08/2023   HCT 44.5 02/08/2023   PLT 247.0 02/08/2023   GLUCOSE 81 02/08/2023   CHOL 132 04/15/2023   TRIG 172.0 (H) 04/15/2023   HDL 54.90 04/15/2023    LDLCALC 43 04/15/2023   ALT 17 02/08/2023   AST 23 02/08/2023   NA 138 02/08/2023   K 4.7 02/08/2023   CL 106 02/08/2023   CREATININE 1.01 02/08/2023   BUN 17 02/08/2023   CO2 28 02/08/2023   TSH 1.08 02/08/2023   PSA 0.9 04/12/2023   INR 0.9 05/31/2007   HGBA1C 6.0 02/08/2023    CT Angio Chest PE W and/or Wo Contrast  Result Date: 04/02/2022 CLINICAL DATA:  Pulmonary embolism (PE) suspected, high prob right sided pleuritic chest pain x 2 days EXAM: CT ANGIOGRAPHY CHEST WITH CONTRAST TECHNIQUE: Multidetector CT imaging of the chest was performed using the standard protocol during bolus administration of intravenous contrast. Multiplanar CT image reconstructions and MIPs were obtained to evaluate the vascular anatomy. RADIATION DOSE REDUCTION: This exam was performed according to the departmental dose-optimization program which includes automated exposure control, adjustment of the mA and/or kV according to patient size and/or use of iterative reconstruction technique. CONTRAST:  75mL OMNIPAQUE IOHEXOL 350 MG/ML SOLN COMPARISON:  Chest x-ray from the same day. FINDINGS: Cardiovascular: Satisfactory opacification of the pulmonary arteries to the segmental level. No evidence of pulmonary embolism. Normal heart size. No pericardial effusion. Calcific atherosclerosis of the aorta. Mediastinum/Nodes: No enlarged mediastinal, hilar, or axillary lymph nodes. Thyroid gland, trachea, and esophagus demonstrate no significant findings. Lungs/Pleura: Approximately 4 mm pulmonary nodule in the left lower lobe. (Series 6, image 107). Mild dependent ground-glass opacities, probably atelectasis. No consolidation. No pleural effusions or pneumothorax. Upper Abdomen: No acute abnormality. Musculoskeletal: No evidence of acute fracture multilevel degenerative change in the thoracic spine including bridging osteophytes. Review of the MIP images confirms the above findings. IMPRESSION: 1. No evidence of acute pulmonary  embolism. 2. Approximately 4 mm pulmonary nodule in the left lower lobe. No follow-up needed if patient is low-risk.This recommendation follows the consensus statement: Guidelines for Management of Incidental Pulmonary Nodules Detected on CT Images: From the Fleischner Society 2017; Radiology 2017; 284:228-243. 3. Aortic Atherosclerosis (ICD10-I70.0). Electronically Signed   By: Feliberto Harts M.D.   On: 04/02/2022 13:21   DG Chest 2 View  Result Date: 04/02/2022 CLINICAL DATA:  Chest pain EXAM: CHEST - 2 VIEW COMPARISON:  03/28/2020 FINDINGS: Artifact from EKG pads. Normal heart size and mediastinal contours. No acute infiltrate or edema. No effusion or pneumothorax. Lower thoracic endplate spurring. No acute osseous findings. IMPRESSION: No active cardiopulmonary disease. Electronically Signed   By: Tiburcio Pea M.D.   On: 04/02/2022 12:17    Assessment & Plan:  Flu vaccine need -     Flu Vaccine Trivalent High Dose (Fluad)  Dyslipidemia, goal LDL below 130 - LDL goal achieved. Doing well on the statin  -     Lipid panel; Future -     Rosuvastatin Calcium; Take 1 tablet (10 mg total) by mouth daily.  Dispense: 90 tablet; Refill: 1  Essential hypertension- EKG is negative for LVH.  His blood pressure is adequately well-controlled.  Will continue the current dose of the ARB. -     EKG 12-Lead -     Olmesartan Medoxomil; Take 1 tablet (20 mg total) by mouth daily.  Dispense: 90 tablet; Refill: 1  Encounter for general adult medical examination with abnormal findings - Exam completed, labs reviewed, vaccines reviewed and updated, cancer screenings are UTD,  pt ed material was given.   Need for prophylactic vaccination with combined diphtheria-tetanus-pertussis (DTP) vaccine -     Boostrix; Inject 0.5 mLs into the muscle once for 1 dose.  Dispense: 0.5 mL; Refill: 0  Need for Zostavax administration -     Shingrix; Inject 0.5 mLs into the muscle once for 1 dose.  Dispense: 0.5 mL; Refill:  1     Follow-up: Return in about 6 months (around 10/13/2023).  Sanda Linger, MD

## 2023-04-17 MED ORDER — ROSUVASTATIN CALCIUM 10 MG PO TABS: 10.0000 mg | ORAL_TABLET | Freq: Every day | ORAL | 1 refills | Status: DC

## 2023-04-17 MED ORDER — OLMESARTAN MEDOXOMIL 20 MG PO TABS: 20.0000 mg | ORAL_TABLET | Freq: Every day | ORAL | 1 refills | Status: DC

## 2023-04-19 DIAGNOSIS — R351 Nocturia: Secondary | ICD-10-CM | POA: Diagnosis not present

## 2023-04-19 DIAGNOSIS — N403 Nodular prostate with lower urinary tract symptoms: Secondary | ICD-10-CM | POA: Diagnosis not present

## 2023-04-19 DIAGNOSIS — N5201 Erectile dysfunction due to arterial insufficiency: Secondary | ICD-10-CM | POA: Diagnosis not present

## 2023-04-19 DIAGNOSIS — C61 Malignant neoplasm of prostate: Secondary | ICD-10-CM | POA: Diagnosis not present

## 2023-04-26 ENCOUNTER — Encounter: Payer: Self-pay | Admitting: Cardiology

## 2023-04-26 ENCOUNTER — Ambulatory Visit (INDEPENDENT_AMBULATORY_CARE_PROVIDER_SITE_OTHER): Payer: HMO | Admitting: Cardiology

## 2023-04-26 VITALS — BP 162/78 | HR 65 | Ht 72.0 in | Wt 189.8 lb

## 2023-04-26 DIAGNOSIS — I10 Essential (primary) hypertension: Secondary | ICD-10-CM

## 2023-04-26 DIAGNOSIS — E785 Hyperlipidemia, unspecified: Secondary | ICD-10-CM

## 2023-04-26 DIAGNOSIS — R55 Syncope and collapse: Secondary | ICD-10-CM | POA: Diagnosis not present

## 2023-04-26 NOTE — Patient Instructions (Signed)
  Testing/Procedures:  Your physician has requested that you have an echocardiogram. Echocardiography is a painless test that uses sound waves to create images of your heart. It provides your doctor with information about the size and shape of your heart and how well your heart's chambers and valves are working. This procedure takes approximately one hour. There are no restrictions for this procedure. Please do NOT wear cologne, perfume, aftershave, or lotions (deodorant is allowed). Please arrive 15 minutes prior to your appointment time. [place], you and your health needs are our priority.  As part of our continuing mission to provide you with exceptional heart care, we have created designated Provider Care Teams.  These Care Teams include your primary Cardiologist (physician) and Advanced Practice Providers (APPs -  Physician Assistants and Nurse Practitioners) who all work together to provide you with the care you need, when you need it.  We recommend signing up for the patient portal called "MyChart".  Sign up information is provided on this After Visit Summary.  MyChart is used to connect with patients for Virtual Visits (Telemedicine).  Patients are able to view lab/test results, encounter notes, upcoming appointments, etc.  Non-urgent messages can be sent to your provider as well.   To learn more about what you can do with MyChart, go to [email or web address].    Your next appointment:   6 month(s)  Provider:   Kirk Ruths, MD

## 2023-04-30 ENCOUNTER — Other Ambulatory Visit: Payer: Self-pay | Admitting: *Deleted

## 2023-04-30 ENCOUNTER — Ambulatory Visit (HOSPITAL_COMMUNITY): Payer: HMO | Attending: Cardiovascular Disease

## 2023-04-30 ENCOUNTER — Encounter: Payer: Self-pay | Admitting: Cardiology

## 2023-04-30 DIAGNOSIS — R55 Syncope and collapse: Secondary | ICD-10-CM | POA: Insufficient documentation

## 2023-04-30 DIAGNOSIS — Z79899 Other long term (current) drug therapy: Secondary | ICD-10-CM

## 2023-04-30 DIAGNOSIS — I10 Essential (primary) hypertension: Secondary | ICD-10-CM

## 2023-04-30 LAB — ECHOCARDIOGRAM COMPLETE
Area-P 1/2: 3.3 cm2
S' Lateral: 2.7 cm

## 2023-04-30 MED ORDER — OLMESARTAN MEDOXOMIL 40 MG PO TABS: 40.0000 mg | ORAL_TABLET | Freq: Every day | ORAL | 3 refills | Status: DC

## 2023-05-04 MED ORDER — CHLORTHALIDONE 25 MG PO TABS: ORAL_TABLET | ORAL | 3 refills | Status: DC

## 2023-05-04 NOTE — Addendum Note (Signed)
Addended by: Candie Chroman on: 05/04/2023 10:33 AM   Modules accepted: Orders

## 2023-05-04 NOTE — Addendum Note (Signed)
Addended by: Loa Socks on: 05/04/2023 11:28 AM   Modules accepted: Orders

## 2023-05-05 ENCOUNTER — Other Ambulatory Visit: Payer: Self-pay | Admitting: *Deleted

## 2023-05-05 DIAGNOSIS — Z79899 Other long term (current) drug therapy: Secondary | ICD-10-CM

## 2023-05-05 DIAGNOSIS — I10 Essential (primary) hypertension: Secondary | ICD-10-CM

## 2023-05-11 ENCOUNTER — Ambulatory Visit: Payer: HMO | Attending: Cardiology

## 2023-05-11 DIAGNOSIS — I10 Essential (primary) hypertension: Secondary | ICD-10-CM | POA: Diagnosis not present

## 2023-05-11 DIAGNOSIS — Z79899 Other long term (current) drug therapy: Secondary | ICD-10-CM | POA: Diagnosis not present

## 2023-05-12 ENCOUNTER — Encounter: Payer: Self-pay | Admitting: Cardiology

## 2023-05-12 LAB — BASIC METABOLIC PANEL
BUN/Creatinine Ratio: 16 (ref 10–24)
BUN: 15 mg/dL (ref 8–27)
CO2: 25 mmol/L (ref 20–29)
Calcium: 9.7 mg/dL (ref 8.6–10.2)
Chloride: 103 mmol/L (ref 96–106)
Creatinine, Ser: 0.95 mg/dL (ref 0.76–1.27)
Glucose: 84 mg/dL (ref 70–99)
Potassium: 4.4 mmol/L (ref 3.5–5.2)
Sodium: 142 mmol/L (ref 134–144)
eGFR: 83 mL/min/{1.73_m2} (ref >59)

## 2023-05-24 DIAGNOSIS — L814 Other melanin hyperpigmentation: Secondary | ICD-10-CM | POA: Diagnosis not present

## 2023-05-24 DIAGNOSIS — Z85828 Personal history of other malignant neoplasm of skin: Secondary | ICD-10-CM | POA: Diagnosis not present

## 2023-05-24 DIAGNOSIS — L570 Actinic keratosis: Secondary | ICD-10-CM | POA: Diagnosis not present

## 2023-05-25 ENCOUNTER — Other Ambulatory Visit: Payer: Self-pay | Admitting: Internal Medicine

## 2023-05-25 DIAGNOSIS — E785 Hyperlipidemia, unspecified: Secondary | ICD-10-CM

## 2023-05-28 ENCOUNTER — Ambulatory Visit: Payer: HMO | Admitting: Cardiology

## 2023-07-09 DIAGNOSIS — R051 Acute cough: Secondary | ICD-10-CM | POA: Diagnosis not present

## 2023-07-09 DIAGNOSIS — J40 Bronchitis, not specified as acute or chronic: Secondary | ICD-10-CM | POA: Diagnosis not present

## 2023-07-09 DIAGNOSIS — J069 Acute upper respiratory infection, unspecified: Secondary | ICD-10-CM | POA: Diagnosis not present

## 2023-08-02 DIAGNOSIS — J189 Pneumonia, unspecified organism: Secondary | ICD-10-CM | POA: Diagnosis not present

## 2023-08-11 DIAGNOSIS — L570 Actinic keratosis: Secondary | ICD-10-CM | POA: Diagnosis not present

## 2023-08-11 DIAGNOSIS — Z85828 Personal history of other malignant neoplasm of skin: Secondary | ICD-10-CM | POA: Diagnosis not present

## 2023-08-11 DIAGNOSIS — L821 Other seborrheic keratosis: Secondary | ICD-10-CM | POA: Diagnosis not present

## 2023-08-18 ENCOUNTER — Other Ambulatory Visit: Payer: Self-pay | Admitting: Internal Medicine

## 2023-08-18 DIAGNOSIS — I10 Essential (primary) hypertension: Secondary | ICD-10-CM

## 2023-08-27 ENCOUNTER — Other Ambulatory Visit: Payer: Self-pay | Admitting: Internal Medicine

## 2023-08-27 DIAGNOSIS — I10 Essential (primary) hypertension: Secondary | ICD-10-CM

## 2023-09-06 NOTE — Patient Instructions (Addendum)
 Cory Vasquez , Thank you for taking time to come for your Medicare Wellness Visit. I appreciate your ongoing commitment to your health goals. Please review the following plan we discussed and let me know if I can assist you in the future.   Referrals/Orders/Follow-Ups/Clinician Recommendations: It was nice talking with you today.  This is a list of the screening recommended for you and due dates:  Health Maintenance  Topic Date Due   Zoster (Shingles) Vaccine (1 of 2) Never done   DTaP/Tdap/Td vaccine (2 - Tdap) 01/24/2020   Medicare Annual Wellness Visit  04/10/2022   Pneumonia Vaccine  Completed   Flu Shot  Completed   Hepatitis C Screening  Completed   HPV Vaccine  Aged Out   COVID-19 Vaccine  Discontinued   Cologuard (Stool DNA test)  Discontinued    Advanced directives: (Declined) Advance directive discussed with you today. Even though you declined this today, please call our office should you change your mind, and we can give you the proper paperwork for you to fill out.  Next Medicare Annual Wellness Visit scheduled for next year: No

## 2023-09-06 NOTE — Progress Notes (Signed)
 This encounter was created in error - please disregard. Patient asked to reschedule due to his wife just coming home from hospital.

## 2023-09-13 ENCOUNTER — Telehealth: Payer: Self-pay | Admitting: Cardiology

## 2023-09-13 ENCOUNTER — Telehealth: Payer: Self-pay

## 2023-09-13 NOTE — Telephone Encounter (Signed)
Received a call back from patient he stated he has been light headed and dizzy off and on for the past several weeks.He notices increase in heart rate.He has not blacked out.No chest pain.Appointment scheduled with Dr.[name] 2/20 at 8:40 am.

## 2023-09-13 NOTE — Telephone Encounter (Signed)
 Called patient left message on personal voice mail to call back.

## 2023-09-13 NOTE — Telephone Encounter (Signed)
Patient c/o Palpitations:  High priority if patient c/o lightheadedness, shortness of breath, or chest pain  How long have you had palpitations/irregular HR/ Afib? Are you having the symptoms now?  Palpitations began yesterday. No palpitations currently.  Are you currently experiencing lightheadedness, SOB or CP?  Not currently   Do you have a history of afib (atrial fibrillation) or irregular heart rhythm?  Hx of afib   Have you checked your BP or HR? (document readings if available):  No, but patient states BP is usually relatively high   Are you experiencing any other symptoms?  Lightheadedness comes with palpitations

## 2023-09-14 NOTE — Telephone Encounter (Signed)
Patient identification verified by 2 forms. Marilynn Rail, RN    Called and spoke to patient  Patient states:   -last episode of lightheadedness/palpitation was 3-4 days ago   -yesterday BP at PCP office was high  -2/18: BP: 163/90 HR: 79   -has OV with Dr. Jens Som 2/20 Patient denies:  -headache   -chest pain/pressure   -visual changes/disturbances   -lightheadedness/dizziness  -palpitations  Informed patient message sent to Dr. Jens Som  Reviewed ED warning signs/precautions  Patient verbalized understanding, no questions at this time

## 2023-09-14 NOTE — Progress Notes (Deleted)
 HPI: FU syncope. He did have nonsustained ventricular tachycardia on a prior exercise treadmill but his LV function is normal. A previous Myoview in November 2005 showed an ejection fraction of 58% with no ischemia or infarction. Echo 8/16 showed normal LV function, grade 1 diastolic dysfunction. Event monitor 8/16 negative. ETT 10/16 negative. 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 He converted spontaneously to sinus rhythm.  Calcium score October 2022 65 which was 29th percentile.  Seen 9/24 with syncope. Echo 10/24 showed normal LV function, grade 1 DD. Since last seen,    Current Outpatient Medications  Medication Sig Dispense Refill   aspirin 81 MG EC tablet Take 81 mg by mouth daily.     chlorthalidone (HYGROTON) 25 MG tablet Take half tablet (12.5 mg ) Daily 45 tablet 3   Multiple Vitamin (MULTIVITAMIN WITH MINERALS) TABS tablet Take 1 tablet by mouth daily.     olmesartan (BENICAR) 40 MG tablet Take 1 tablet (40 mg total) by mouth daily. 90 tablet 3   rosuvastatin (CRESTOR) 10 MG tablet TAKE 1 TABLET(10 MG) BY MOUTH DAILY 90 tablet 1   sildenafil (REVATIO) 20 MG tablet Take 20 mg by mouth as needed.     No current facility-administered medications for this visit.     Past Medical History:  Diagnosis Date   Diverticulosis    see colonoscopy 5/06 rec fu 11/2014   HBP (high blood pressure)    ACE d/c'd 11/02/08. cough/sorethroat > resolved   Health maintenance examination    [name]. Td June 30/2011. CPX June 30/2011   Hyperlipidemia    Palpitations    neg myoview 11/05 with EF 58%   Prostate cancer (HCC)    SVT (supraventricular tachycardia)     Past Surgical History:  Procedure Laterality Date   BACK SURGERY     [name] 11/08    Social History   Socioeconomic History   Marital status: Married    Spouse name: Not on file   Number of children: 2   Years  of education: Not on file   Highest education level: Not on file  Occupational History   Occupation: Salesman  Tobacco Use   Smoking status: Never   Smokeless tobacco: Never  Substance and Sexual Activity   Alcohol use: Yes    Alcohol/week: 5.0 - 6.0 standard drinks of alcohol    Types: 5 - 6 Standard drinks or equivalent per week    Comment: Occasional   Drug use: No   Sexual activity: Not on file  Other Topics Concern   Not on file  Social History Narrative   Denies flu shot 09/29/2010         Social Drivers of Health   Financial Resource Strain: Low Risk  (04/10/2021)   Overall Financial Resource Strain (CARDIA)    Difficulty of Paying Living Expenses: Not hard at all  Food Insecurity: No Food Insecurity (04/10/2021)   Hunger Vital Sign    Worried About Running Out of Food in the Last Year: Never true    Ran Out of Food in the Last Year: Never true  Transportation Needs: No Transportation Needs (04/10/2021)   PRAPARE - Administrator, Civil Service (Medical): No    Lack of Transportation (Non-Medical): No  Physical Activity: Sufficiently Active (04/10/2021)   Exercise Vital Sign    Days of Exercise per Week: 5 days  Minutes of Exercise per Session: 30 min  Stress: No Stress Concern Present (04/10/2021)   [employer] of Occupational Health - Occupational Stress Questionnaire    Feeling of Stress : Not at all  Social Connections: Not on file  Intimate Partner Violence: Not At Risk (04/10/2021)   Humiliation, Afraid, Rape, and Kick questionnaire    Fear of Current or Ex-Partner: No    Emotionally Abused: No    Physically Abused: No    Sexually Abused: No    Family History  Problem Relation Age of Onset   Heart disease Father 58   Diabetes Brother     ROS: no fevers or chills, productive cough, hemoptysis, dysphasia, odynophagia, melena, hematochezia, dysuria, hematuria, rash, seizure activity, orthopnea, PND, pedal edema, claudication. Remaining  systems are negative.  Physical Exam: Well-developed well-nourished in no acute distress.  Skin is warm and dry.  HEENT is normal.  Neck is supple.  Chest is clear to auscultation with normal expansion.  Cardiovascular exam is regular rate and rhythm.  Abdominal exam nontender or distended. No masses palpated. Extremities show no edema. neuro grossly intact  ECG- personally reviewed  A/P 1 syncope-previous episode felt vagally mediated with possible contribution from dehydration; LV function normal; no recurrences; will follow for now.    2 hypertension-blood pressure controlled; continue present meds and follow.    3 hyperlipidemia-continue statin.   4 coronary calcification-continue aspirin and statin. Pt denies CP.    5 history of SVT-patient has not had an episode since 2017.  Can consider referral for ablation in the future if he has recurrences.   Olga Millers, MD

## 2023-09-16 ENCOUNTER — Ambulatory Visit: Payer: HMO | Admitting: Cardiology

## 2023-09-21 ENCOUNTER — Telehealth: Payer: Self-pay | Admitting: Cardiology

## 2023-09-21 NOTE — Telephone Encounter (Signed)
 Pt c/o BP issue: STAT if pt c/o blurred vision, one-sided weakness or slurred speech  1. What are your last 5 BP readings?   175/75 (approximate)  2. Are you having any other symptoms (ex. Dizziness, headache, blurred vision, passed out)?   Afib, lightheadedness  3. What is your BP issue?   Patient is concerned he has also been having Afib as well as high BP readings.  Patient noted he was not having Afib symptoms now and has a history of afib.

## 2023-09-21 NOTE — Telephone Encounter (Signed)
 Left message for patient to return call , ( may speak to any triage nurse)

## 2023-09-22 NOTE — Telephone Encounter (Signed)
 Patient identification verified by 2 forms. Marilynn Rail, RN    Called and spoke to patient  Patient states:   -would like appointment with Dr. Jens Som   -canceled previous appointments   -needs a sooner appointment   -saw Dr. Jens Som few months ago due to passing out   -has concerns about BP and palpitations   -his palpitations have resumed, occur periodically  -not currently having palpitations   -does not check BP at home  Patient denies:   -palpitation   -chest pain   -chest pressure   -SOB  Patient scheduled for OV 2/27 with PA Lisabeth Devoid at 10:30am  Reviewed ED warning signs  Patient has no further questions at this time

## 2023-09-23 ENCOUNTER — Ambulatory Visit: Payer: HMO | Attending: Physician Assistant | Admitting: Physician Assistant

## 2023-09-23 ENCOUNTER — Encounter: Payer: Self-pay | Admitting: Physician Assistant

## 2023-09-23 VITALS — BP 138/80 | HR 65 | Ht 72.0 in | Wt 194.0 lb

## 2023-09-23 DIAGNOSIS — I10 Essential (primary) hypertension: Secondary | ICD-10-CM | POA: Diagnosis not present

## 2023-09-23 DIAGNOSIS — R55 Syncope and collapse: Secondary | ICD-10-CM | POA: Diagnosis not present

## 2023-09-23 DIAGNOSIS — E785 Hyperlipidemia, unspecified: Secondary | ICD-10-CM

## 2023-09-23 NOTE — Progress Notes (Unsigned)
 Cardiology Office Note:  .   Date:  09/24/2023  ID:  Cory Vasquez, DOB November 03, 1945, MRN [id number] PCP: [name], MD  [facility] Providers Cardiologist:  Olga Millers, MD     History of Present Illness: .   Cory Vasquez is a 78 y.o. male with past medical history of hypertension, hyperlipidemia, coronary calcification, history of SVT and syncope.  He did have nonsustained VT on previous exercise treadmill by his EF was normal.  Previous Myoview in November 2005 showed EF 58%, no ischemia or infarction.  Echocardiogram in August 2016 showed normal EF, grade 1 DD.  Event monitor in August 2016 was negative.  ETT in October 2016 was negative.  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  He converted spontaneously to sinus rhythm.  Coronary calcium score obtained in October 2022 was 65 which placed the patient in 29th percentile for age and sex matched control.  Patient was referred to cardiology service in 2024 for syncope evaluation.  He did not ED in the morning of the event.  He went to a restaurant to order sandwich and came back out.  As he was waiting for the bus, he became very hot.  As he was walking to the shade, he had frank syncope.  There was no preceding chest pain, palpitation, dyspnea, nausea and diaphoresis.  He was unconscious for approximately 20 seconds.  EMS was called however he declined to go to the hospital.  He was evaluated by Dr. Jens Som in September, blood pressure that day was 160s.  He is symptom was felt to be likely vagal mediated with possible superimposed dehydration.  Echocardiogram obtained on 04/30/2023 showed EF 55 to 60%, no regional wall motion abnormality, grade 1 DD, no significant valve issue.  It was recommended to consider loop recorder if the patient has any recurrent palpitation.  He contacted cardiology service on 09/13/2023 complaining of episode of lightheadedness  and palpitation.  Patient presents today for follow-up.  He says he had a single episode of lightheadedness and palpitation, this occurred over a week ago.  The palpitation is sudden onset and lasted about 5 minutes before a sudden offset.  I suspect the patient went out of rhythm given the sudden onset and sudden offset.  During the episode, he had significant dizziness.  He says similar events has happened in the past, however this is fairly rare and may only occurs about once every several months to once every several years.  Given the rarity of the event, a heart monitor may not be able to capture anything.  I briefly discussed with the patient possibility of considering a loop recorder, he wished to hold off at this time and may reconsider if he has another event.  Recently his PCP tweaked his blood pressure medication and added low-dose chlorthalidone.  I asked the patient to keep himself adequately hydrated while on chlorthalidone.  Olmesartan dose was also increased.  His blood pressure is very well-controlled.  He denies any chest pain or shortness of breath.  ROS:   Patient had a single episode of dizziness recently.  He denies any chest pain or shortness of breath.  He has no lower extremity edema, orthopnea or PND.  Studies Reviewed: Marland Kitchen   EKG Interpretation Date/Time:  Thursday September 23 2023 10:33:02 EST Ventricular Rate:  65 PR Interval:  176 QRS Duration:  76 QT Interval:  382 QTC Calculation: 397 R Axis:   6  Text Interpretation: Normal sinus rhythm Low voltage QRS When compared with ECG of 02-Apr-2022 11:38, No significant change was found Confirmed by Azalee Course [phone]) on 09/24/2023 7:24:17 PM     Risk Assessment/Calculations:             Physical Exam:   VS:  BP 138/80 (BP Location: Right Arm, Patient Position: Sitting, Cuff Size: Normal)   Pulse 65   Ht 6' (1.829 m)   Wt 194 lb (88 kg)   BMI 26.31 kg/m    Wt Readings from Last 3 Encounters:  09/23/23 194 lb (88 kg)   04/26/23 189 lb 12.8 oz (86.1 kg)  04/15/23 188 lb 9.6 oz (85.5 kg)    GEN: Well nourished, well developed in no acute distress NECK: No JVD; No carotid bruits CARDIAC: RRR, no murmurs, rubs, gallops RESPIRATORY:  Clear to auscultation without rales, wheezing or rhonchi  ABDOMEN: Soft, non-tender, non-distended EXTREMITIES:  No edema; No deformity   ASSESSMENT AND PLAN: .     Recurrent Palpitations and Syncope History of SVT and syncope. Previous workup including echocardiogram, event monitor, and ETT were negative. -He had a single episode of palpitation recently it was sudden onset and offset, associated with palpitation.  I suspect patient went out of rhythm.  He has a history of SVT. -Such symptoms usually occurs once every several months to once every several years, therefore tradition 2-week heart monitor is unlikely to capture anything. -I discussed with the patient possibility of consider a loop recorder, however he wished to hold off at this time unless symptom recurs. -Continue current management and follow-up with Dr. Jens Som in May.  Hypertension Well controlled on increased dose of Amosartan and newly added low dose Chlorthalidone. -Advise patient to maintain adequate hydration while on Chlorthalidone. -Continue current medication regimen.  Hyperlipidemia: On rosuvastatin       Dispo: Follow-up with Dr. Jens Som in May  Signed, Azalee Course, [place]

## 2023-09-23 NOTE — Patient Instructions (Signed)
 Medication Instructions:  NO CHANGES *If you need a refill on your cardiac medications before your next appointment, please call your pharmacy*   Lab Work: NO LABS If you have labs (blood work) drawn today and your tests are completely normal, you will receive your results only by: MyChart Message (if you have MyChart) OR A paper copy in the mail If you have any lab test that is abnormal or we need to change your treatment, we will call you to review the results.   Testing/Procedures: NO TESTING   Follow-Up: At [facility], you and your health needs are our priority.  As part of our continuing mission to provide you with exceptional heart care, we have created designated Provider Care Teams.  These Care Teams include your primary Cardiologist (physician) and Advanced Practice Providers (APPs -  Physician Assistants and Nurse Practitioners) who all work together to provide you with the care you need, when you need it.  Your next appointment:   KEEP FOLLOW UP MAY 2025  Provider:   Olga Millers, MD

## 2023-10-21 ENCOUNTER — Ambulatory Visit: Payer: HMO

## 2023-10-22 DIAGNOSIS — C61 Malignant neoplasm of prostate: Secondary | ICD-10-CM | POA: Diagnosis not present

## 2023-10-22 LAB — PSA: PSA: 1.1

## 2023-10-25 DIAGNOSIS — Z85828 Personal history of other malignant neoplasm of skin: Secondary | ICD-10-CM | POA: Diagnosis not present

## 2023-10-25 DIAGNOSIS — L821 Other seborrheic keratosis: Secondary | ICD-10-CM | POA: Diagnosis not present

## 2023-10-25 DIAGNOSIS — L570 Actinic keratosis: Secondary | ICD-10-CM | POA: Diagnosis not present

## 2023-11-01 DIAGNOSIS — N5201 Erectile dysfunction due to arterial insufficiency: Secondary | ICD-10-CM | POA: Diagnosis not present

## 2023-11-01 DIAGNOSIS — C61 Malignant neoplasm of prostate: Secondary | ICD-10-CM | POA: Diagnosis not present

## 2023-11-01 DIAGNOSIS — N403 Nodular prostate with lower urinary tract symptoms: Secondary | ICD-10-CM | POA: Diagnosis not present

## 2023-11-01 DIAGNOSIS — R351 Nocturia: Secondary | ICD-10-CM | POA: Diagnosis not present

## 2023-11-15 ENCOUNTER — Ambulatory Visit (INDEPENDENT_AMBULATORY_CARE_PROVIDER_SITE_OTHER)

## 2023-11-15 VITALS — Ht 72.0 in | Wt 185.0 lb

## 2023-11-15 DIAGNOSIS — Z0000 Encounter for general adult medical examination without abnormal findings: Secondary | ICD-10-CM

## 2023-11-15 DIAGNOSIS — Z1212 Encounter for screening for malignant neoplasm of rectum: Secondary | ICD-10-CM

## 2023-11-15 DIAGNOSIS — Z1211 Encounter for screening for malignant neoplasm of colon: Secondary | ICD-10-CM

## 2023-11-15 NOTE — Progress Notes (Signed)
 Subjective:   Cory Vasquez is a 78 y.o. who presents for a Medicare Wellness preventive visit.  Visit Complete: Virtual I connected with  Waverly Hageman on 11/15/23 by a video and audio enabled telemedicine application and verified that I am speaking with the correct person using two identifiers.  Patient Location: Home  Provider Location: Home Office  I discussed the limitations of evaluation and management by telemedicine. The patient expressed understanding and agreed to proceed.  Vital Signs: Because this visit was a virtual/telehealth visit, some criteria may be missing or patient reported. Any vitals not documented were not able to be obtained and vitals that have been documented are patient reported.  Persons Participating in Visit: Patient.  AWV Questionnaire: No: Patient Medicare AWV questionnaire was not completed prior to this visit.  Cardiac Risk Factors include: advanced age (>43men, >32 women);hypertension;male gender;dyslipidemia     Objective:    Today's Vitals   11/15/23 0806  Weight: 185 lb (83.9 kg)  Height: 6' (1.829 m)   Body mass index is 25.09 kg/m.     11/15/2023    8:19 AM 04/02/2022   11:42 AM 04/10/2021   10:23 AM 11/26/2015    3:24 PM 09/02/2015    4:00 PM 04/25/2015    9:59 AM  Advanced Directives  Does Patient Have a Medical Advance Directive? Yes No Yes No No No  Type of Estate agent of [place];Living will  Living will;Healthcare Power of Attorney     Does patient want to make changes to medical advance directive?   No - Patient declined     Copy of Healthcare Power of Attorney in Chart? No - copy requested  No - copy requested     Would patient like information on creating a medical advance directive?  Yes (ED - Information included in AVS)        Current Medications (verified) Outpatient Encounter Medications as of 11/15/2023  Medication Sig   aspirin 81 MG EC tablet Take 81 mg by mouth daily.   chlorthalidone   (HYGROTON ) 25 MG tablet Take half tablet (12.5 mg ) Daily   Multiple Vitamin (MULTIVITAMIN WITH MINERALS) TABS tablet Take 1 tablet by mouth daily.   olmesartan  (BENICAR ) 40 MG tablet Take 1 tablet (40 mg total) by mouth daily.   rosuvastatin  (CRESTOR ) 10 MG tablet TAKE 1 TABLET(10 MG) BY MOUTH DAILY   sildenafil  (REVATIO ) 20 MG tablet Take 20 mg by mouth as needed.   No facility-administered encounter medications on file as of 11/15/2023.    Allergies (verified) Patient has no known allergies.   History: Past Medical History:  Diagnosis Date   Diverticulosis    see colonoscopy 5/06 rec fu 11/2014   HBP (high blood pressure)    ACE d/c'd 11/02/08. cough/sorethroat > resolved   Health maintenance examination    [name]. Td June 30/2011. CPX June 30/2011   Hyperlipidemia    Palpitations    neg myoview 11/05 with EF 58%   Prostate cancer (HCC)    SVT (supraventricular tachycardia) [facility])    Past Surgical History:  Procedure Laterality Date   BACK SURGERY     [name] 11/08   Family History  Problem Relation Age of Onset   Heart disease Father 49   Diabetes Brother    Social History   Socioeconomic History   Marital status: Married    Spouse name: Gene   Number of children: 2   Years of education: Not on file   Highest  education level: Not on file  Occupational History   Occupation: Salesman  Tobacco Use   Smoking status: Never   Smokeless tobacco: Never  Substance and Sexual Activity   Alcohol use: Yes    Alcohol/week: 5.0 - 6.0 standard drinks of alcohol    Types: 5 - 6 Standard drinks or equivalent per week    Comment: Occasional   Drug use: No   Sexual activity: Not on file  Other Topics Concern   Not on file  Social History Narrative   Denies flu shot 09/29/2010   Lives at home with wife   Social Drivers of Health   Financial Resource Strain: Low Risk  (11/15/2023)   Overall Financial Resource Strain (CARDIA)    Difficulty of Paying Living Expenses: Not hard at  all  Food Insecurity: No Food Insecurity (11/15/2023)   Hunger Vital Sign    Worried About Running Out of Food in the Last Year: Never true    Ran Out of Food in the Last Year: Never true  Transportation Needs: No Transportation Needs (11/15/2023)   PRAPARE - Administrator, Civil Service (Medical): No    Lack of Transportation (Non-Medical): No  Physical Activity: Sufficiently Active (11/15/2023)   Exercise Vital Sign    Days of Exercise per Week: 5 days    Minutes of Exercise per Session: 60 min  Stress: No Stress Concern Present (11/15/2023)   [employer] of Occupational Health - Occupational Stress Questionnaire    Feeling of Stress : Not at all  Social Connections: Moderately Integrated (11/15/2023)   Social Connection and Isolation Panel [NHANES]    Frequency of Communication with Friends and Family: More than three times a week    Frequency of Social Gatherings with Friends and Family: Three times a week    Attends Religious Services: More than 4 times per year    Active Member of Clubs or Organizations: No    Attends Engineer, structural: Not on file    Marital Status: Married    Tobacco Counseling Counseling given: Not Answered    Clinical Intake:  Pre-visit preparation completed: Yes  Pain : No/denies pain     BMI - recorded: 25.09 Nutritional Status: BMI 25 -29 Overweight Nutritional Risks: None Diabetes: No  Lab Results  Component Value Date   HGBA1C 6.0 02/08/2023   HGBA1C 5.9 04/07/2022   HGBA1C 5.6 03/28/2020     How often do you need to have someone help you when you read instructions, pamphlets, or other written materials from your doctor or pharmacy?: 1 - Never  Interpreter Needed?: No  Information entered by :: Romelle Muldoon, RMA   Activities of Daily Living     11/15/2023    8:06 AM  In your present state of health, do you have any difficulty performing the following activities:  Hearing? 0  Vision? 0  Difficulty  concentrating or making decisions? 0  Walking or climbing stairs? 0  Dressing or bathing? 0  Doing errands, shopping? 0  Preparing Food and eating ? N  Using the Toilet? N  In the past six months, have you accidently leaked urine? N  Do you have problems with loss of bowel control? N  Managing your Medications? N  Managing your Finances? N  Housekeeping or managing your Housekeeping? N    Patient Care Team: Arcadio Knuckles, MD as PCP - General (Internal Medicine) Audery Blazing Deannie Fabian, MD as PCP - Cardiology (Cardiology) Center, [facility] as Consulting  Physician (Optometry)  Indicate any recent Medical Services you may have received from other than [facility] providers in the past year (date may be approximate).     Assessment:   This is a routine wellness examination for Rameen.  Hearing/Vision screen Hearing Screening - Comments:: Denies hearing difficulties   Vision Screening - Comments:: Wears eyeglasses   Goals Addressed   None    Depression Screen     11/15/2023    8:20 AM 04/15/2023    8:15 AM 04/20/2022    1:48 PM 04/10/2021   10:22 AM 03/28/2020    9:07 AM 03/28/2019   10:05 AM  PHQ 2/9 Scores  PHQ - 2 Score 0 0 0 0 0 0  PHQ- 9 Score 0  0       Fall Risk     11/15/2023    8:18 AM 04/15/2023    8:15 AM 04/20/2022    1:48 PM 04/10/2021   10:24 AM 03/28/2020    9:07 AM  Fall Risk   Falls in the past year? 0 0 0 0 0  Number falls in past yr: 0 0 0 0   Injury with Fall? 0 0 0 0   Risk for fall due to : No Fall Risks No Fall Risks No Fall Risks No Fall Risks   Follow up Falls prevention discussed;Falls evaluation completed Falls evaluation completed Falls evaluation completed Falls evaluation completed     MEDICARE RISK AT HOME:  Medicare Risk at Home Any stairs in or around the home?: Yes If so, are there any without handrails?: Yes Home free of loose throw rugs in walkways, pet beds, electrical cords, etc?: Yes Adequate lighting in your home to reduce risk of  falls?: Yes Life alert?: No Use of a cane, walker or w/c?: No Grab bars in the bathroom?: Yes Shower chair or bench in shower?: Yes Elevated toilet seat or a handicapped toilet?: Yes  TIMED UP AND GO:  Was the test performed?  No  Cognitive Function: 6CIT completed        11/15/2023    8:07 AM  6CIT Screen  What Year? 0 points  What month? 0 points  What time? 0 points  Count back from 20 0 points  Months in reverse 0 points  Repeat phrase 0 points  Total Score 0 points    Immunizations Immunization History  Administered Date(s) Administered   Fluad Quad(high Dose 65+) 03/28/2019, 04/10/2021, 04/07/2022   Fluad Trivalent(High Dose 65+) 04/15/2023   Influenza, High Dose Seasonal PF 03/25/2018   Influenza,inj,Quad PF,6+ Mos 03/28/2020   Moderna Sars-Covid-2 Vaccination 08/19/2019, 09/23/2019, 03/21/2020, 12/02/2020   Pneumococcal Conjugate-13 03/25/2016   Pneumococcal Polysaccharide-23 03/28/2019   Td 01/23/2010    Screening Tests Health Maintenance  Topic Date Due   Zoster Vaccines- Shingrix  (1 of 2) Never done   DTaP/Tdap/Td (2 - Tdap) 01/24/2020   Medicare Annual Wellness (AWV)  04/10/2022   INFLUENZA VACCINE  02/25/2024   Pneumonia Vaccine 7+ Years old  Completed   Hepatitis C Screening  Completed   HPV VACCINES  Aged Out   Meningococcal B Vaccine  Aged Out   COVID-19 Vaccine  Discontinued   Fecal DNA (Cologuard)  Discontinued    Health Maintenance  Health Maintenance Due  Topic Date Due   Zoster Vaccines- Shingrix  (1 of 2) Never done   DTaP/Tdap/Td (2 - Tdap) 01/24/2020   Medicare Annual Wellness (AWV)  04/10/2022   Health Maintenance Items Addressed: Cologuard Ordered, See Nurse Notes  Additional Screening:  Vision Screening: Recommended annual ophthalmology exams for early detection of glaucoma and other disorders of the eye.  Dental Screening: Recommended annual dental exams for proper oral hygiene  Community Resource Referral / Chronic  Care Management: CRR required this visit?  No   CCM required this visit?  No     Plan:     I have personally reviewed and noted the following in the patient's chart:   Medical and social history Use of alcohol, tobacco or illicit drugs  Current medications and supplements including opioid prescriptions. Patient is not currently taking opioid prescriptions. Functional ability and status Nutritional status Physical activity Advanced directives List of other physicians Hospitalizations, surgeries, and ER visits in previous 12 months Vitals Screenings to include cognitive, depression, and falls Referrals and appointments  In addition, I have reviewed and discussed with patient certain preventive protocols, quality metrics, and best practice recommendations. A written personalized care plan for preventive services as well as general preventive health recommendations were provided to patient.     Aidenn Skellenger L Lakysha Kossman, CMA   11/15/2023   After Visit Summary: (MyChart) Due to this being a telephonic visit, the after visit summary with patients personalized plan was offered to patient via MyChart   Notes: Please refer to Routing Comments.

## 2023-11-15 NOTE — Patient Instructions (Signed)
 Cory Vasquez , Thank you for taking time to come for your Medicare Wellness Visit. I appreciate your ongoing commitment to your health goals. Please review the following plan we discussed and let me know if I can assist you in the future.   Referrals/Orders/Follow-Ups/Clinician Recommendations: It was nice talking with you today.  You are due for a Tetanus vaccine and a Cologuard.  Please watch out for the Cologuard kit that will come to your resident.    This is a list of the screening recommended for you and due dates:  Health Maintenance  Topic Date Due   Zoster (Shingles) Vaccine (1 of 2) Never done   DTaP/Tdap/Td vaccine (2 - Tdap) 01/24/2020   Medicare Annual Wellness Visit  04/10/2022   Flu Shot  02/25/2024   Pneumonia Vaccine  Completed   Hepatitis C Screening  Completed   HPV Vaccine  Aged Out   Meningitis B Vaccine  Aged Out   COVID-19 Vaccine  Discontinued   Cologuard (Stool DNA test)  Discontinued    Advanced directives: (Copy Requested) Please bring a copy of your health care power of attorney and living will to the office to be added to your chart at your convenience. You can mail to [facility] [address] [place], [place] [address] or email to [email or web address]  Next Medicare Annual Wellness Visit scheduled for next year: Yes

## 2023-11-22 NOTE — Progress Notes (Signed)
 HPI: FU syncope. He did have nonsustained ventricular tachycardia on a prior exercise treadmill but his LV function is normal. A previous Myoview in November 2005 showed an ejection fraction of 58% with no ischemia or infarction. Event monitor 8/16 negative. ETT 10/16 negative. 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 He converted spontaneously to sinus rhythm.  Calcium  score October 2022 65 which was 29th percentile.  Seen for a syncopal episode September 2024.  Echocardiogram October 2024 showed normal LV function, grade 1 diastolic dysfunction.  He was seen for an episode of palpitations with dizziness February 2025 but did not have frank syncope.  Since last seen patient denies dyspnea, chest pain or syncope.  He does have dizziness with standing.  Current Outpatient Medications  Medication Sig Dispense Refill   aspirin 81 MG EC tablet Take 81 mg by mouth daily.     chlorthalidone  (HYGROTON ) 25 MG tablet Take half tablet (12.5 mg ) Daily 45 tablet 3   Multiple Vitamin (MULTIVITAMIN WITH MINERALS) TABS tablet Take 1 tablet by mouth daily.     olmesartan  (BENICAR ) 40 MG tablet Take 1 tablet (40 mg total) by mouth daily. 90 tablet 3   rosuvastatin  (CRESTOR ) 10 MG tablet TAKE 1 TABLET(10 MG) BY MOUTH DAILY 90 tablet 1   sildenafil  (REVATIO ) 20 MG tablet Take 20 mg by mouth as needed.     No current facility-administered medications for this visit.     Past Medical History:  Diagnosis Date   Diverticulosis    see colonoscopy 5/06 rec fu 11/2014   HBP (high blood pressure)    ACE d/c'd 11/02/08. cough/sorethroat > resolved   Health maintenance examination    [name]. Td June 30/2011. CPX June 30/2011   Hyperlipidemia    Palpitations    neg myoview 11/05 with EF 58%   Prostate cancer (HCC)    [facility]sup[facility]ricular tachycardia) [facility])     Past Surgical History:  Procedure  Laterality Date   BACK SURGERY     [name] 11/08    Social History   Socioeconomic History   Marital status: Married    Spouse name: Gene   Number of children: 2   Years of education: Not on file   Highest education level: Not on file  Occupational History   Occupation: Salesman  Tobacco Use   Smoking status: Never   Smokeless tobacco: Never  Substance and Sexual Activity   Alcohol use: Yes    Alcohol/week: 5.0 - 6.0 standard drinks of alcohol    Types: 5 - 6 Standard drinks or equivalent per week    Comment: Occasional   Drug use: No   Sexual activity: Not on file  Other Topics Concern   Not on file  Social History Narrative   Denies flu shot 09/29/2010   Lives at home with wife   Social Drivers of Health   Financial Resource Strain: Low Risk  (11/15/2023)   Overall Financial Resource Strain (CARDIA)    Difficulty of Paying Living Expenses: Not hard at all  Food Insecurity: No Food Insecurity (11/15/2023)   Hunger Vital Sign    Worried About Running Out of Food in the Last Year: Never true    Ran Out of Food in the Last Year: Never true  Transportation Needs: No Transportation Needs (11/15/2023)   PRAPARE - Administrator, Civil Service (Medical): No  Lack of Transportation (Non-Medical): No  Physical Activity: Sufficiently Active (11/15/2023)   Exercise Vital Sign    Days of Exercise per Week: 5 days    Minutes of Exercise per Session: 60 min  Stress: No Stress Concern Present (11/15/2023)   [employer] of Occupational Health - Occupational Stress Questionnaire    Feeling of Stress : Not at all  Social Connections: Moderately Integrated (11/15/2023)   Social Connection and Isolation Panel [NHANES]    Frequency of Communication with Friends and Family: More than three times a week    Frequency of Social Gatherings with Friends and Family: Three times a week    Attends Religious Services: More than 4 times per year    Active Member of Clubs or  Organizations: No    Attends Banker Meetings: Not on file    Marital Status: Married  Intimate Partner Violence: Not At Risk (11/15/2023)   Humiliation, Afraid, Rape, and Kick questionnaire    Fear of Current or Ex-Partner: No    Emotionally Abused: No    Physically Abused: No    Sexually Abused: No    Family History  Problem Relation Age of Onset   Heart disease Father 24   Diabetes Brother     ROS: no fevers or chills, productive cough, hemoptysis, dysphasia, odynophagia, melena, hematochezia, dysuria, hematuria, rash, seizure activity, orthopnea, PND, pedal edema, claudication. Remaining systems are negative.  Physical Exam: Well-developed well-nourished in no acute distress.  Skin is warm and dry.  HEENT is normal.  Neck is supple.  Chest is clear to auscultation with normal expansion.  Cardiovascular exam is regular rate and rhythm.  Abdominal exam nontender or distended. No masses palpated. Extremities show no edema. neuro grossly intact   A/P  1 syncope-previous episode felt potentially vagally mediated with superimposed dehydration.  He has had no recurrences since last office visit.  His LV function is normal.  If he has additional episodes in the future would need implantable loop monitor.  2 hypertension-patient's blood pressure is controlled.  However he does note dizziness with standing occasionally.  Will discontinue chlorthalidone  and follow.  We will allow his blood pressure to run higher to see if this improves his symptoms.  I have asked him to bring his cuff to the office and we will correlate his readings with ours.  3 coronary calcification-he denies chest pain.  Continue medical therapy with aspirin and statin.  4 hyperlipidemia-continue statin.  5 prior episode of SVT-he has not had recurrences since 2017.  Can consider referral for ablation in the future if he has more frequent episodes.  Alexandria Angel, MD

## 2023-11-24 DIAGNOSIS — L738 Other specified follicular disorders: Secondary | ICD-10-CM | POA: Diagnosis not present

## 2023-11-24 DIAGNOSIS — L570 Actinic keratosis: Secondary | ICD-10-CM | POA: Diagnosis not present

## 2023-11-24 DIAGNOSIS — L821 Other seborrheic keratosis: Secondary | ICD-10-CM | POA: Diagnosis not present

## 2023-11-24 DIAGNOSIS — Z85828 Personal history of other malignant neoplasm of skin: Secondary | ICD-10-CM | POA: Diagnosis not present

## 2023-12-03 ENCOUNTER — Encounter: Payer: Self-pay | Admitting: Cardiology

## 2023-12-03 ENCOUNTER — Ambulatory Visit: Payer: HMO | Attending: Cardiology | Admitting: Cardiology

## 2023-12-03 VITALS — BP 114/68 | HR 70 | Ht 72.0 in | Wt 189.0 lb

## 2023-12-03 DIAGNOSIS — I10 Essential (primary) hypertension: Secondary | ICD-10-CM | POA: Diagnosis not present

## 2023-12-03 DIAGNOSIS — I2510 Atherosclerotic heart disease of native coronary artery without angina pectoris: Secondary | ICD-10-CM

## 2023-12-03 DIAGNOSIS — E785 Hyperlipidemia, unspecified: Secondary | ICD-10-CM

## 2023-12-03 DIAGNOSIS — R55 Syncope and collapse: Secondary | ICD-10-CM | POA: Diagnosis not present

## 2023-12-03 NOTE — Patient Instructions (Signed)
 Medication Instructions:  Stop: Chlorthalidone  (Hygroton )  Lab Work: None  Follow-Up: At [facility], you and your health needs are our priority.  As part of our continuing mission to provide you with exceptional heart care, our providers are all part of one team.  This team includes your primary Cardiologist (physician) and Advanced Practice Providers or APPs (Physician Assistants and Nurse Practitioners) who all work together to provide you with the care you need, when you need it.  Your next appointment:   6 month(s)  Provider:   Alexandria Angel, MD   Other Instructions Please call us  or send a MyChart message to know when Abran Abrahams, RN (Dr. Lourdes Roy nurse) is available for a BP cuff check/nurse visit.   Please call us  or send a MyChart message with any Cardiology related questions/concerns.  [phone].  Thank you!

## 2023-12-22 ENCOUNTER — Ambulatory Visit: Admitting: Physician Assistant

## 2023-12-22 ENCOUNTER — Telehealth: Payer: Self-pay

## 2023-12-22 DIAGNOSIS — I10 Essential (primary) hypertension: Secondary | ICD-10-CM

## 2023-12-22 DIAGNOSIS — Z79899 Other long term (current) drug therapy: Secondary | ICD-10-CM

## 2023-12-22 MED ORDER — CHLORTHALIDONE 25 MG PO TABS: 12.5000 mg | ORAL_TABLET | Freq: Every day | ORAL | Status: DC

## 2023-12-22 NOTE — Telephone Encounter (Signed)
 Spoke with pt and advised of Dr Lourdes Roy recommendations as below.  Order placed for BMET and Chlorthalidone  added to pt's medications list.  Pt verbalizes understanding and agrees with current plan.

## 2023-12-22 NOTE — Progress Notes (Deleted)
   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 History of Present Illness Cory Vasquez is a 77 y.o. male  Last seen by Dr. Audery Blazing 12/03/23. He had been evaluated in clinic in 08/2023 for palpitations, dizziness. He did not have frank syncope. He noted dizziness with standing. Previous episode of syncope was felt to be potentially vagally medicate with superimposed dehydration. He had no further episodes. If he has recurrent syncope, Dr. Audery Blazing has recommended ILR. Chlorthalidone  was stopped. BP is to be allowed to run higher.     ROS-See HPI***    Studies Reviewed:      *** Results  Risk Assessment/Calculations: {Does this patient have ATRIAL FIBRILLATION?:[phone]} No BP recorded.  {Refresh Note OR Click here to enter BP  :1}***      Physical Exam:  VS:  There were no vitals taken for this visit.   Wt Readings from Last 3 Encounters:  12/03/23 189 lb (85.7 kg)  11/15/23 185 lb (83.9 kg)  09/23/23 194 lb (88 kg)    Physical Exam***     Assessment and Plan: Assessment & Plan  Assessment and Plan Assessment & Plan    {      :1}    {Are you ordering a  CV Procedure (e.g. stress test, cath, DCCV, TEE, etc)?   Press F2        :[id number]}  Dispo:  No follow-ups on file. Signed, Marlyse Single, PA-C

## 2023-12-22 NOTE — Telephone Encounter (Signed)
 Patient walked in today as he reports he has been trying to call [facility] but has not been able to get through on the phone so he decided to walk in today.  Pt reports during his 12/03/23 visit with Dr Audery Blazing, Chlorthalidone  was stopped.  Pt reports elevated BP's since that time.  Pt brought his BP wrist cuff today for comparison.  Wrist cuff measures BP as 169/73 - HR 70.  Manual BP 150/74 - HR 70.  He has taken his Olmesartan  40mg  this morning.  Pt also reports a short episode of palpitations yesterday while walking. Pt states he has had these episodes for years.  Pt denies current CP,SOB or dizziness/syncope. Pt advised will forward information to Dr Audery Blazing for review and further advisement.  Pt verbalizes understanding and agrees with current plan.

## 2023-12-24 ENCOUNTER — Encounter: Payer: Self-pay | Admitting: Cardiology

## 2023-12-24 DIAGNOSIS — R55 Syncope and collapse: Secondary | ICD-10-CM

## 2023-12-24 NOTE — Telephone Encounter (Signed)
 Spoke with patient, has had three palpitation episodes in the last 2 days. States these only last 3-4 minutes each, has some lightheadedness with it. Denies SOB or chest pain. Wanted to get Dr Lourdes Roy opinion of the external event monitor prior to loop implant since episodes are coming more frequently now. Will forward to Dr Audery Blazing for recommendation.

## 2023-12-28 ENCOUNTER — Ambulatory Visit: Attending: Cardiology

## 2023-12-28 DIAGNOSIS — R55 Syncope and collapse: Secondary | ICD-10-CM

## 2023-12-28 NOTE — Progress Notes (Unsigned)
 Enrolled patient for a 14 day Zio XT  monitor to be mailed to patients home

## 2023-12-30 ENCOUNTER — Telehealth: Payer: Self-pay | Admitting: Cardiology

## 2023-12-30 NOTE — Telephone Encounter (Signed)
 Pt wanted us  to guide him over the phone how to apply monitor.  Advised that the instructions that come with the monitor are fairly self explanatory and usually simple enough for the pt to place on himself.  Aware I will forward to device clinic to determine if pt needs to be scheduled to come to the office to have this placed.  Pt agreeable to plan.

## 2023-12-30 NOTE — Telephone Encounter (Signed)
 Pt requesting cb due to receiving heart monitor in the mail and wanting to further discuss.

## 2023-12-31 DIAGNOSIS — Z79899 Other long term (current) drug therapy: Secondary | ICD-10-CM | POA: Diagnosis not present

## 2023-12-31 DIAGNOSIS — I10 Essential (primary) hypertension: Secondary | ICD-10-CM | POA: Diagnosis not present

## 2023-12-31 LAB — BASIC METABOLIC PANEL WITH GFR
BUN/Creatinine Ratio: 16 (ref 10–24)
BUN: 17 mg/dL (ref 8–27)
CO2: 24 mmol/L (ref 20–29)
Calcium: 9.9 mg/dL (ref 8.6–10.2)
Chloride: 103 mmol/L (ref 96–106)
Creatinine, Ser: 1.05 mg/dL (ref 0.76–1.27)
Glucose: 80 mg/dL (ref 70–99)
Potassium: 5.1 mmol/L (ref 3.5–5.2)
Sodium: 141 mmol/L (ref 134–144)
eGFR: 74 mL/min/{1.73_m2} (ref >59)

## 2024-01-01 ENCOUNTER — Ambulatory Visit: Payer: Self-pay | Admitting: Cardiology

## 2024-01-13 ENCOUNTER — Ambulatory Visit: Admitting: Physical Medicine and Rehabilitation

## 2024-01-19 ENCOUNTER — Telehealth: Payer: Self-pay | Admitting: Cardiology

## 2024-01-19 ENCOUNTER — Ambulatory Visit: Payer: Self-pay | Admitting: Cardiology

## 2024-01-19 DIAGNOSIS — R55 Syncope and collapse: Secondary | ICD-10-CM

## 2024-01-19 MED ORDER — METOPROLOL SUCCINATE ER 25 MG PO TB24: 25.0000 mg | ORAL_TABLET | Freq: Every day | ORAL | 3 refills | Status: AC

## 2024-01-19 NOTE — Telephone Encounter (Signed)
 [facility]

## 2024-01-19 NOTE — Telephone Encounter (Signed)
 Received abnormal report from [facility] for [facility]io monitor.   Pt had an episode of symptomatic SVT HR 181 lasted 60 sec on 01/02/24 at 9:20 am.    MD has already resulted study started pt on Toprol  25 mg PO at bedtime.  Will send to MD as an FYI.

## 2024-02-03 ENCOUNTER — Other Ambulatory Visit: Payer: Self-pay | Admitting: Internal Medicine

## 2024-02-03 DIAGNOSIS — E785 Hyperlipidemia, unspecified: Secondary | ICD-10-CM

## 2024-03-19 DIAGNOSIS — U071 COVID-19: Secondary | ICD-10-CM | POA: Diagnosis not present

## 2024-03-19 DIAGNOSIS — R0981 Nasal congestion: Secondary | ICD-10-CM | POA: Diagnosis not present

## 2024-04-17 ENCOUNTER — Encounter: Payer: Self-pay | Admitting: Internal Medicine

## 2024-04-17 ENCOUNTER — Ambulatory Visit: Admitting: Internal Medicine

## 2024-04-17 ENCOUNTER — Ambulatory Visit: Payer: Self-pay | Admitting: Internal Medicine

## 2024-04-17 VITALS — BP 144/80 | HR 70 | Temp 97.8°F | Resp 16 | Ht 72.0 in | Wt 183.6 lb

## 2024-04-17 DIAGNOSIS — Z0001 Encounter for general adult medical examination with abnormal findings: Secondary | ICD-10-CM

## 2024-04-17 DIAGNOSIS — C61 Malignant neoplasm of prostate: Secondary | ICD-10-CM | POA: Diagnosis not present

## 2024-04-17 DIAGNOSIS — Z0000 Encounter for general adult medical examination without abnormal findings: Secondary | ICD-10-CM

## 2024-04-17 DIAGNOSIS — Z23 Encounter for immunization: Secondary | ICD-10-CM

## 2024-04-17 DIAGNOSIS — E785 Hyperlipidemia, unspecified: Secondary | ICD-10-CM

## 2024-04-17 DIAGNOSIS — I10 Essential (primary) hypertension: Secondary | ICD-10-CM | POA: Diagnosis not present

## 2024-04-17 DIAGNOSIS — R739 Hyperglycemia, unspecified: Secondary | ICD-10-CM | POA: Diagnosis not present

## 2024-04-17 LAB — TSH: TSH: 1.17 u[IU]/mL (ref 0.35–5.50)

## 2024-04-17 LAB — CBC WITH DIFFERENTIAL/PLATELET
Basophils Absolute: 0 K/uL (ref 0.0–0.1)
Basophils Relative: 0.2 % (ref 0.0–3.0)
Eosinophils Absolute: 0.2 K/uL (ref 0.0–0.7)
Eosinophils Relative: 3.1 % (ref 0.0–5.0)
HCT: 43.8 % (ref 39.0–52.0)
Hemoglobin: 14.7 g/dL (ref 13.0–17.0)
Lymphocytes Relative: 33 % (ref 12.0–46.0)
Lymphs Abs: 1.7 K/uL (ref 0.7–4.0)
MCHC: 33.6 g/dL (ref 30.0–36.0)
MCV: 95.1 fl (ref 78.0–100.0)
Monocytes Absolute: 0.5 K/uL (ref 0.1–1.0)
Monocytes Relative: 9.2 % (ref 3.0–12.0)
Neutro Abs: 2.8 K/uL (ref 1.4–7.7)
Neutrophils Relative %: 54.5 % (ref 43.0–77.0)
Platelets: 266 K/uL (ref 150.0–400.0)
RBC: 4.6 Mil/uL (ref 4.22–5.81)
RDW: 12.6 % (ref 11.5–15.5)
WBC: 5.1 K/uL (ref 4.0–10.5)

## 2024-04-17 LAB — HEPATIC FUNCTION PANEL
ALT: 23 U/L (ref 0–53)
AST: 25 U/L (ref 0–37)
Albumin: 4.1 g/dL (ref 3.5–5.2)
Alkaline Phosphatase: 69 U/L (ref 39–117)
Bilirubin, Direct: 0.2 mg/dL (ref 0.0–0.3)
Total Bilirubin: 0.5 mg/dL (ref 0.2–1.2)
Total Protein: 7 g/dL (ref 6.0–8.3)

## 2024-04-17 LAB — LIPID PANEL
Cholesterol: 121 mg/dL (ref 0–200)
HDL: 55.1 mg/dL (ref >39.00)
LDL Cholesterol: 50 mg/dL (ref 0–99)
NonHDL: 66.07
Total CHOL/HDL Ratio: 2
Triglycerides: 81 mg/dL (ref 0.0–149.0)
VLDL: 16.2 mg/dL (ref 0.0–40.0)

## 2024-04-17 LAB — BASIC METABOLIC PANEL WITH GFR
BUN: 19 mg/dL (ref 6–23)
CO2: 29 meq/L (ref 19–32)
Calcium: 9.9 mg/dL (ref 8.4–10.5)
Chloride: 103 meq/L (ref 96–112)
Creatinine, Ser: 1.01 mg/dL (ref 0.40–1.50)
GFR: 71.94 mL/min (ref >60.00)
Glucose, Bld: 91 mg/dL (ref 70–99)
Potassium: 4.6 meq/L (ref 3.5–5.1)
Sodium: 139 meq/L (ref 135–145)

## 2024-04-17 LAB — HEMOGLOBIN A1C: Hgb A1c MFr Bld: 6.4 % (ref 4.6–6.5)

## 2024-04-17 MED ORDER — ROSUVASTATIN CALCIUM 10 MG PO TABS: 10.0000 mg | ORAL_TABLET | Freq: Every day | ORAL | 1 refills | Status: DC

## 2024-04-17 NOTE — Patient Instructions (Signed)
 Health Maintenance, Male  Adopting a healthy lifestyle and getting preventive care are important in promoting health and wellness. Ask your health care provider about:  The right schedule for you to have regular tests and exams.  Things you can do on your own to prevent diseases and keep yourself healthy.  What should I know about diet, weight, and exercise?  Eat a healthy diet    Eat a diet that includes plenty of vegetables, fruits, low-fat dairy products, and lean protein.  Do not eat a lot of foods that are high in solid fats, added sugars, or sodium.  Maintain a healthy weight  Body mass index (BMI) is a measurement that can be used to identify possible weight problems. It estimates body fat based on height and weight. Your health care provider can help determine your BMI and help you achieve or maintain a healthy weight.  Get regular exercise  Get regular exercise. This is one of the most important things you can do for your health. Most adults should:  Exercise for at least 150 minutes each week. The exercise should increase your heart rate and make you sweat (moderate-intensity exercise).  Do strengthening exercises at least twice a week. This is in addition to the moderate-intensity exercise.  Spend less time sitting. Even light physical activity can be beneficial.  Watch cholesterol and blood lipids  Have your blood tested for lipids and cholesterol at 77 years of age, then have this test every 5 years.  You may need to have your cholesterol levels checked more often if:  Your lipid or cholesterol levels are high.  You are older than 77 years of age.  You are at high risk for heart disease.  What should I know about cancer screening?  Many types of cancers can be detected early and may often be prevented. Depending on your health history and family history, you may need to have cancer screening at various ages. This may include screening for:  Colorectal cancer.  Prostate cancer.  Skin cancer.  Lung  cancer.  What should I know about heart disease, diabetes, and high blood pressure?  Blood pressure and heart disease  High blood pressure causes heart disease and increases the risk of stroke. This is more likely to develop in people who have high blood pressure readings or are overweight.  Talk with your health care provider about your target blood pressure readings.  Have your blood pressure checked:  Every 3-5 years if you are 24-52 years of age.  Every year if you are 3 years old or older.  If you are between the ages of 60 and 72 and are a current or former smoker, ask your health care provider if you should have a one-time screening for abdominal aortic aneurysm (AAA).  Diabetes  Have regular diabetes screenings. This checks your fasting blood sugar level. Have the screening done:  Once every three years after age 66 if you are at a normal weight and have a low risk for diabetes.  More often and at a younger age if you are overweight or have a high risk for diabetes.  What should I know about preventing infection?  Hepatitis B  If you have a higher risk for hepatitis B, you should be screened for this virus. Talk with your health care provider to find out if you are at risk for hepatitis B infection.  Hepatitis C  Blood testing is recommended for:  Everyone born from [birth year] through 1964.  Anyone  with known risk factors for hepatitis C.  Sexually transmitted infections (STIs)  You should be screened each year for STIs, including gonorrhea and chlamydia, if:  You are sexually active and are younger than 77 years of age.  You are older than 77 years of age and your health care provider tells you that you are at risk for this type of infection.  Your sexual activity has changed since you were last screened, and you are at increased risk for chlamydia or gonorrhea. Ask your health care provider if you are at risk.  Ask your health care provider about whether you are at high risk for HIV. Your health care provider  may recommend a prescription medicine to help prevent HIV infection. If you choose to take medicine to prevent HIV, you should first get tested for HIV. You should then be tested every 3 months for as long as you are taking the medicine.  Follow these instructions at home:  Alcohol use  Do not drink alcohol if your health care provider tells you not to drink.  If you drink alcohol:  Limit how much you have to 0-2 drinks a day.  Know how much alcohol is in your drink. In the [place] one drink equals one 12 oz bottle of beer (355 mL), one 5 oz glass of wine (148 mL), or one 1 oz glass of hard liquor (44 mL).  Lifestyle  Do not use any products that contain nicotine or tobacco. These products include cigarettes, chewing tobacco, and vaping devices, such as e-cigarettes. If you need help quitting, ask your health care provider.  Do not use street drugs.  Do not share needles.  Ask your health care provider for help if you need support or information about quitting drugs.  General instructions  Schedule regular health, dental, and eye exams.  Stay current with your vaccines.  Tell your health care provider if:  You often feel depressed.  You have ever been abused or do not feel safe at home.  Summary  Adopting a healthy lifestyle and getting preventive care are important in promoting health and wellness.  Follow your health care provider's instructions about healthy diet, exercising, and getting tested or screened for diseases.  Follow your health care provider's instructions on monitoring your cholesterol and blood pressure.  This information is not intended to replace advice given to you by your health care provider. Make sure you discuss any questions you have with your health care provider.  Document Revised: 12/02/2020 Document Reviewed: 12/02/2020  Elsevier Patient Education  2024 [employer].

## 2024-04-17 NOTE — Progress Notes (Signed)
 Subjective:  Patient ID: Cory Vasquez, male    DOB: 11/05/1945  Age: 78 y.o. MRN: [id number]  CC: Hypertension, Hyperlipidemia, and Annual Exam   HPI Cory Vasquez presents for a CPX and f/up ----  Discussed the use of AI scribe software for clinical note transcription with the patient, who gave verbal consent to proceed.  History of Present Illness Cory Vasquez is a 78 year old male with cardiac arrhythmia who presents for follow-up of palpitations and lightheadedness.  He has been experiencing palpitations, which were monitored using a device for 30 days. During this period, the device recorded 86 episodes, although he only noticed one episode himself. He describes the palpitations as more of an 'aggravation' than a serious issue. He is currently taking metoprolol  to manage these symptoms, which he had previously stopped.  He experienced a syncopal episode while on a trip with his daughter but did not require hospitalization. He has not had any further episodes of passing out since then. He feels lightheaded at times, which he manages by sitting down and waiting for it to pass. No recent chest pain, shortness of breath, dizziness, or low blood pressure, although he mentions a history of high blood pressure and occasional low readings.  He has been actively managing his weight, intentionally losing about ten pounds recently. He also lost additional weight during a COVID-19 infection three to four weeks ago, but he has no lingering symptoms from the infection. His current weight is around 180 pounds, which he considers ideal.  He is taking rosuvastatin  without any side effects such as muscle or joint aches.     Outpatient Medications Prior to Visit  Medication Sig Dispense Refill   aspirin 81 MG EC tablet Take 81 mg by mouth daily.     chlorthalidone  (HYGROTON ) 25 MG tablet Take 0.5 tablets (12.5 mg total) by mouth daily.     metoprolol  succinate (TOPROL  XL) 25 MG 24 hr  tablet Take 1 tablet (25 mg total) by mouth at bedtime. 90 tablet 3   Multiple Vitamin (MULTIVITAMIN WITH MINERALS) TABS tablet Take 1 tablet by mouth daily.     olmesartan  (BENICAR ) 40 MG tablet Take 1 tablet (40 mg total) by mouth daily. 90 tablet 3   sildenafil  (REVATIO ) 20 MG tablet Take 20 mg by mouth as needed.     rosuvastatin  (CRESTOR ) 10 MG tablet TAKE 1 TABLET(10 MG) BY MOUTH DAILY 90 tablet 0   No facility-administered medications prior to visit.    ROS Review of Systems  Constitutional:  Negative for appetite change, chills, diaphoresis, fatigue and fever.  HENT: Negative.    Eyes:  Negative for visual disturbance.  Respiratory: Negative.  Negative for cough, chest tightness, shortness of breath and wheezing.   Cardiovascular:  Positive for palpitations. Negative for chest pain and leg swelling.  Gastrointestinal:  Negative for abdominal pain, constipation, diarrhea, nausea and vomiting.  Genitourinary: Negative.  Negative for difficulty urinating and dysuria.  Musculoskeletal: Negative.  Negative for arthralgias and myalgias.  Skin: Negative.  Negative for color change and rash.  Neurological:  Negative for dizziness, syncope, weakness and light-headedness.  Hematological:  Negative for adenopathy. Does not bruise/bleed easily.  Psychiatric/Behavioral:  Positive for confusion and decreased concentration. Negative for agitation and suicidal ideas. The patient is not nervous/anxious.     Objective:  BP (!) 144/80 (BP Location: Left Arm, Patient Position: Sitting, Cuff Size: Normal)   Pulse 70   Temp 97.8 F (36.6 C) (Oral)  Resp 16   Ht 6' (1.829 m)   Wt 183 lb 9.6 oz (83.3 kg)   SpO2 98%   BMI 24.90 kg/m   BP Readings from Last 3 Encounters:  04/17/24 (!) 144/80  12/03/23 114/68  09/23/23 138/80    Wt Readings from Last 3 Encounters:  04/17/24 183 lb 9.6 oz (83.3 kg)  12/03/23 189 lb (85.7 kg)  11/15/23 185 lb (83.9 kg)    Physical Exam Vitals reviewed.   Constitutional:      Appearance: Normal appearance.  HENT:     Nose: Nose normal.     Mouth/Throat:     Mouth: Mucous membranes are moist.  Eyes:     General: No scleral icterus.    Conjunctiva/sclera: Conjunctivae normal.  Cardiovascular:     Rate and Rhythm: Normal rate and regular rhythm.     Heart sounds: No murmur heard.    No friction rub. No gallop.  Pulmonary:     Effort: Pulmonary effort is normal.     Breath sounds: No stridor. No wheezing, rhonchi or rales.  Abdominal:     General: Abdomen is flat.     Palpations: There is no mass.     Tenderness: There is no abdominal tenderness. There is no guarding.     Hernia: No hernia is present.  Musculoskeletal:        General: Normal range of motion.     Cervical back: Neck supple.     Right lower leg: No edema.     Left lower leg: No edema.  Lymphadenopathy:     Cervical: No cervical adenopathy.  Skin:    General: Skin is warm and dry.     Findings: No rash.  Neurological:     General: No focal deficit present.     Mental Status: He is alert.  Psychiatric:        Mood and Affect: Mood normal.        Behavior: Behavior normal.     Lab Results  Component Value Date   WBC 5.1 04/17/2024   HGB 14.7 04/17/2024   HCT 43.8 04/17/2024   PLT 266.0 04/17/2024   GLUCOSE 91 04/17/2024   CHOL 121 04/17/2024   TRIG 81.0 04/17/2024   HDL 55.10 04/17/2024   LDLCALC 50 04/17/2024   ALT 23 04/17/2024   AST 25 04/17/2024   NA 139 04/17/2024   K 4.6 04/17/2024   CL 103 04/17/2024   CREATININE 1.01 04/17/2024   BUN 19 04/17/2024   CO2 29 04/17/2024   TSH 1.17 04/17/2024   PSA 1.1 10/22/2023   INR 0.9 05/31/2007   HGBA1C 6.4 04/17/2024    CT Angio Chest PE W and/or Wo Contrast Result Date: 04/02/2022 CLINICAL DATA:  Pulmonary embolism (PE) suspected, high prob right sided pleuritic chest pain x 2 days EXAM: CT ANGIOGRAPHY CHEST WITH CONTRAST TECHNIQUE: Multidetector CT imaging of the chest was performed using the  standard protocol during bolus administration of intravenous contrast. Multiplanar CT image reconstructions and MIPs were obtained to evaluate the vascular anatomy. RADIATION DOSE REDUCTION: This exam was performed according to the departmental dose-optimization program which includes automated exposure control, adjustment of the mA and/or kV according to patient size and/or use of iterative reconstruction technique. CONTRAST:  75mL OMNIPAQUE  IOHEXOL  350 MG/ML SOLN COMPARISON:  Chest x-ray from the same day. FINDINGS: Cardiovascular: Satisfactory opacification of the pulmonary arteries to the segmental level. No evidence of pulmonary embolism. Normal heart size. No pericardial effusion. Calcific atherosclerosis of  the aorta. Mediastinum/Nodes: No enlarged mediastinal, hilar, or axillary lymph nodes. Thyroid  gland, trachea, and esophagus demonstrate no significant findings. Lungs/Pleura: Approximately 4 mm pulmonary nodule in the left lower lobe. (Series 6, image 107). Mild dependent ground-glass opacities, probably atelectasis. No consolidation. No pleural effusions or pneumothorax. Upper Abdomen: No acute abnormality. Musculoskeletal: No evidence of acute fracture multilevel degenerative change in the thoracic spine including bridging osteophytes. Review of the MIP images confirms the above findings. IMPRESSION: 1. No evidence of acute pulmonary embolism. 2. Approximately 4 mm pulmonary nodule in the left lower lobe. No follow-up needed if patient is low-risk.This recommendation follows the consensus statement: Guidelines for Management of Incidental Pulmonary Nodules Detected on CT Images: From the Fleischner Society 2017; Radiology 2017; 284:228-243. 3. Aortic Atherosclerosis (ICD10-I70.0). Electronically Signed   By: Gilmore GORMAN Molt M.D.   On: 04/02/2022 13:21   DG Chest 2 View Result Date: 04/02/2022 CLINICAL DATA:  Chest pain EXAM: CHEST - 2 VIEW COMPARISON:  03/28/2020 FINDINGS: Artifact from EKG pads.  Normal heart size and mediastinal contours. No acute infiltrate or edema. No effusion or pneumothorax. Lower thoracic endplate spurring. No acute osseous findings. IMPRESSION: No active cardiopulmonary disease. Electronically Signed   By: Dorn Roulette M.D.   On: 04/02/2022 12:17    Assessment & Plan:  Need for influenza vaccination -     Flu vaccine HIGH DOSE PF(Fluzone Trivalent)  Encounter for general adult medical examination with abnormal findings- Exam completed, labs reviewed, vaccines reviewed and updated, no cancer screenings indicated, pt ed material was given.   Prostate cancer (HCC)- PSA remains low.  Dyslipidemia, goal LDL below 130- LDL goal achieved. Doing well on the statin  -     Lipid panel; Future -     Hepatic function panel; Future -     TSH; Future -     Rosuvastatin  Calcium ; Take 1 tablet (10 mg total) by mouth daily.  Dispense: 90 tablet; Refill: 1  Essential hypertension- His BP is well controlled. -     Basic metabolic panel with GFR; Future -     CBC with Differential/Platelet; Future -     TSH; Future  Chronic hyperglycemia -     Basic metabolic panel with GFR; Future -     Hemoglobin A1c; Future     Follow-up: Return in about 6 months (around 10/15/2024).  Debby Molt, MD

## 2024-04-24 DIAGNOSIS — C61 Malignant neoplasm of prostate: Secondary | ICD-10-CM | POA: Diagnosis not present

## 2024-05-01 DIAGNOSIS — N5201 Erectile dysfunction due to arterial insufficiency: Secondary | ICD-10-CM | POA: Diagnosis not present

## 2024-05-01 DIAGNOSIS — R399 Unspecified symptoms and signs involving the genitourinary system: Secondary | ICD-10-CM | POA: Diagnosis not present

## 2024-05-01 DIAGNOSIS — C61 Malignant neoplasm of prostate: Secondary | ICD-10-CM | POA: Diagnosis not present

## 2024-05-03 ENCOUNTER — Other Ambulatory Visit: Payer: Self-pay

## 2024-05-03 DIAGNOSIS — I10 Essential (primary) hypertension: Secondary | ICD-10-CM

## 2024-05-05 MED ORDER — OLMESARTAN MEDOXOMIL 40 MG PO TABS: 40.0000 mg | ORAL_TABLET | Freq: Every day | ORAL | 2 refills | Status: AC

## 2024-05-08 ENCOUNTER — Telehealth: Payer: Self-pay | Admitting: Cardiology

## 2024-05-08 DIAGNOSIS — L810 Postinflammatory hyperpigmentation: Secondary | ICD-10-CM | POA: Diagnosis not present

## 2024-05-08 DIAGNOSIS — L570 Actinic keratosis: Secondary | ICD-10-CM | POA: Diagnosis not present

## 2024-05-08 DIAGNOSIS — Z85828 Personal history of other malignant neoplasm of skin: Secondary | ICD-10-CM | POA: Diagnosis not present

## 2024-05-08 DIAGNOSIS — L821 Other seborrheic keratosis: Secondary | ICD-10-CM | POA: Diagnosis not present

## 2024-05-08 MED ORDER — CHLORTHALIDONE 25 MG PO TABS: 12.5000 mg | ORAL_TABLET | Freq: Every day | ORAL | 2 refills | Status: AC

## 2024-05-08 NOTE — Telephone Encounter (Signed)
*  STAT* If patient is at the pharmacy, call can be transferred to refill team.   1. Which medications need to be refilled? (please list name of each medication and dose if known) chlorthalidone  (HYGROTON ) 25 MG tablet   2. Which pharmacy/location (including street and city if local pharmacy) is medication to be sent to? [id number] - [place], [place] - [address] AT [facility] OF [address]   3. Do they need a 30 day or 90 day supply? 90

## 2024-05-08 NOTE — Telephone Encounter (Signed)
 RX sent in

## 2024-05-16 ENCOUNTER — Encounter: Payer: Self-pay | Admitting: Cardiology

## 2024-05-19 DIAGNOSIS — J069 Acute upper respiratory infection, unspecified: Secondary | ICD-10-CM | POA: Diagnosis not present

## 2024-05-29 ENCOUNTER — Encounter: Payer: Self-pay | Admitting: Radiology

## 2024-05-31 DIAGNOSIS — H6123 Impacted cerumen, bilateral: Secondary | ICD-10-CM | POA: Diagnosis not present

## 2024-06-01 DIAGNOSIS — B309 Viral conjunctivitis, unspecified: Secondary | ICD-10-CM | POA: Diagnosis not present

## 2024-06-08 DIAGNOSIS — B309 Viral conjunctivitis, unspecified: Secondary | ICD-10-CM | POA: Diagnosis not present

## 2024-07-10 ENCOUNTER — Encounter: Payer: Self-pay | Admitting: Pharmacist

## 2024-07-10 NOTE — Progress Notes (Signed)
 Pharmacy Quality Measure Review  This patient is appearing on a report for being at risk of failing the adherence measure for cholesterol (statin) and hypertension (ACEi/ARB) medications this calendar year.   Medication: Olmesartan  Last fill date: 06/01/24 for 90 day supply  Medication: Rosuvastatin  Last fill date: 06/01/24 for 90 day supply  Insurance report was not up to date. No action needed at this time.   Darrelyn Drum, PharmD, BCPS, CPP Clinical Pharmacist Practitioner [facility] [phone]

## 2024-08-04 ENCOUNTER — Other Ambulatory Visit: Payer: Self-pay | Admitting: Internal Medicine

## 2024-08-04 DIAGNOSIS — E785 Hyperlipidemia, unspecified: Secondary | ICD-10-CM

## 2024-08-18 ENCOUNTER — Encounter: Payer: Self-pay | Admitting: *Deleted

## 2024-08-18 NOTE — Progress Notes (Signed)
 Cory Vasquez                                          MRN: [id number]   08/18/2024   The [facility] Quality Team Specialist reviewed this patient medical record for the purposes of chart review for care gap closure. The following were reviewed: chart review for care gap closure-controlling blood pressure.    [facility] Quality Team

## 2024-11-15 ENCOUNTER — Ambulatory Visit

## 2025-01-10 ENCOUNTER — Ambulatory Visit

## 2025-04-18 ENCOUNTER — Encounter: Admitting: Internal Medicine
